# Patient Record
Sex: Male | Born: 1950
Health system: Southern US, Community
[De-identification: ages and names within clinical notes are randomized; demographics above are authoritative.]

## PROBLEM LIST (undated history)

## (undated) DIAGNOSIS — D126 Benign neoplasm of colon, unspecified: Secondary | ICD-10-CM

## (undated) DIAGNOSIS — M1990 Unspecified osteoarthritis, unspecified site: Secondary | ICD-10-CM

## (undated) DIAGNOSIS — E079 Disorder of thyroid, unspecified: Secondary | ICD-10-CM

## (undated) DIAGNOSIS — E785 Hyperlipidemia, unspecified: Secondary | ICD-10-CM

## (undated) DIAGNOSIS — I4891 Unspecified atrial fibrillation: Secondary | ICD-10-CM

## (undated) DIAGNOSIS — K5790 Diverticulosis of intestine, part unspecified, without perforation or abscess without bleeding: Secondary | ICD-10-CM

## (undated) HISTORY — DX: Disorder of thyroid, unspecified: E07.9

## (undated) HISTORY — DX: Unspecified osteoarthritis, unspecified site: M19.90

## (undated) HISTORY — PX: OTHER SURGICAL HISTORY: SHX169

## (undated) HISTORY — DX: Diverticulosis of intestine, part unspecified, without perforation or abscess without bleeding: K57.90

## (undated) HISTORY — DX: Hyperlipidemia, unspecified: E78.5

## (undated) HISTORY — DX: Benign neoplasm of colon, unspecified: D12.6

## (undated) HISTORY — PX: COLONOSCOPY: SHX174

## (undated) HISTORY — PX: TONSILLECTOMY: SUR1361

---

## 1980-06-17 HISTORY — PX: CHEST TUBE INSERTION: SHX231

## 2005-05-08 ENCOUNTER — Ambulatory Visit: Payer: Self-pay | Admitting: Internal Medicine

## 2005-06-17 DIAGNOSIS — K5790 Diverticulosis of intestine, part unspecified, without perforation or abscess without bleeding: Secondary | ICD-10-CM

## 2005-06-17 HISTORY — DX: Diverticulosis of intestine, part unspecified, without perforation or abscess without bleeding: K57.90

## 2005-06-17 HISTORY — PX: COLONOSCOPY W/ POLYPECTOMY: SHX1380

## 2005-06-21 ENCOUNTER — Ambulatory Visit: Payer: Self-pay | Admitting: Internal Medicine

## 2005-07-09 ENCOUNTER — Encounter (INDEPENDENT_AMBULATORY_CARE_PROVIDER_SITE_OTHER): Payer: Self-pay | Admitting: *Deleted

## 2005-07-09 ENCOUNTER — Ambulatory Visit: Payer: Self-pay | Admitting: Internal Medicine

## 2005-07-22 ENCOUNTER — Ambulatory Visit: Payer: Self-pay | Admitting: Internal Medicine

## 2005-08-05 ENCOUNTER — Ambulatory Visit: Payer: Self-pay | Admitting: Internal Medicine

## 2005-10-30 ENCOUNTER — Ambulatory Visit: Payer: Self-pay | Admitting: Internal Medicine

## 2006-02-11 ENCOUNTER — Ambulatory Visit: Payer: Self-pay | Admitting: Internal Medicine

## 2006-07-24 ENCOUNTER — Ambulatory Visit: Payer: Self-pay | Admitting: Internal Medicine

## 2006-07-24 LAB — CONVERTED CEMR LAB
HDL: 36 mg/dL — ABNORMAL LOW (ref >39.0)
Triglycerides: 68 mg/dL (ref 0–149)

## 2006-12-06 ENCOUNTER — Emergency Department (HOSPITAL_COMMUNITY): Admission: EM | Admit: 2006-12-06 | Discharge: 2006-12-06 | Payer: Self-pay | Admitting: Emergency Medicine

## 2007-01-21 ENCOUNTER — Ambulatory Visit: Payer: Self-pay | Admitting: Internal Medicine

## 2007-01-29 ENCOUNTER — Encounter (INDEPENDENT_AMBULATORY_CARE_PROVIDER_SITE_OTHER): Payer: Self-pay | Admitting: *Deleted

## 2007-01-29 LAB — CONVERTED CEMR LAB
HDL: 35.6 mg/dL — ABNORMAL LOW (ref >39.0)
LDL Cholesterol: 124 mg/dL — ABNORMAL HIGH (ref 0–99)
Triglycerides: 57 mg/dL (ref 0–149)
VLDL: 11 mg/dL (ref 0–40)

## 2007-02-19 ENCOUNTER — Ambulatory Visit: Payer: Self-pay | Admitting: Internal Medicine

## 2007-02-19 DIAGNOSIS — E039 Hypothyroidism, unspecified: Secondary | ICD-10-CM | POA: Insufficient documentation

## 2007-08-31 ENCOUNTER — Ambulatory Visit: Payer: Self-pay | Admitting: Internal Medicine

## 2007-09-14 LAB — CONVERTED CEMR LAB
TSH: 3.64 microintl units/mL (ref 0.35–5.50)
Total CHOL/HDL Ratio: 4.6
Triglycerides: 60 mg/dL (ref 0–149)
VLDL: 12 mg/dL (ref 0–40)

## 2007-09-15 ENCOUNTER — Encounter (INDEPENDENT_AMBULATORY_CARE_PROVIDER_SITE_OTHER): Payer: Self-pay | Admitting: *Deleted

## 2007-09-28 ENCOUNTER — Telehealth (INDEPENDENT_AMBULATORY_CARE_PROVIDER_SITE_OTHER): Payer: Self-pay | Admitting: *Deleted

## 2008-04-05 ENCOUNTER — Telehealth (INDEPENDENT_AMBULATORY_CARE_PROVIDER_SITE_OTHER): Payer: Self-pay | Admitting: *Deleted

## 2008-04-14 ENCOUNTER — Ambulatory Visit: Payer: Self-pay | Admitting: Internal Medicine

## 2008-04-18 LAB — CONVERTED CEMR LAB: TSH: 4.05 microintl units/mL (ref 0.35–5.50)

## 2008-04-26 ENCOUNTER — Ambulatory Visit: Payer: Self-pay | Admitting: Internal Medicine

## 2008-04-26 DIAGNOSIS — E785 Hyperlipidemia, unspecified: Secondary | ICD-10-CM | POA: Insufficient documentation

## 2008-04-26 LAB — CONVERTED CEMR LAB
Cholesterol, target level: 200 mg/dL
HDL goal, serum: 40 mg/dL

## 2008-09-21 ENCOUNTER — Ambulatory Visit: Payer: Self-pay | Admitting: Internal Medicine

## 2008-09-26 ENCOUNTER — Encounter (INDEPENDENT_AMBULATORY_CARE_PROVIDER_SITE_OTHER): Payer: Self-pay | Admitting: *Deleted

## 2008-09-26 LAB — CONVERTED CEMR LAB
Cholesterol: 175 mg/dL (ref 0–200)
LDL Cholesterol: 126 mg/dL — ABNORMAL HIGH (ref 0–99)

## 2008-09-28 ENCOUNTER — Ambulatory Visit: Payer: Self-pay | Admitting: Internal Medicine

## 2008-09-28 LAB — CONVERTED CEMR LAB
Cholesterol, target level: 200 mg/dL
LDL Goal: 90 mg/dL

## 2009-09-05 ENCOUNTER — Ambulatory Visit: Payer: Self-pay | Admitting: Internal Medicine

## 2009-09-11 LAB — CONVERTED CEMR LAB
AST: 30 units/L (ref 0–37)
Albumin: 3.9 g/dL (ref 3.5–5.2)
Alkaline Phosphatase: 59 units/L (ref 39–117)
Bilirubin, Direct: 0.1 mg/dL (ref 0.0–0.3)
Cholesterol: 171 mg/dL (ref 0–200)
Total CHOL/HDL Ratio: 4
Triglycerides: 50 mg/dL (ref 0.0–149.0)

## 2009-09-21 ENCOUNTER — Ambulatory Visit: Payer: Self-pay | Admitting: Internal Medicine

## 2010-06-17 DIAGNOSIS — I4891 Unspecified atrial fibrillation: Secondary | ICD-10-CM

## 2010-06-17 HISTORY — DX: Unspecified atrial fibrillation: I48.91

## 2010-06-26 ENCOUNTER — Encounter: Payer: Self-pay | Admitting: Internal Medicine

## 2010-07-17 NOTE — Assessment & Plan Note (Signed)
Summary: roa per note about refills//lch   Vital Signs:  Patient profile:   60 year old male Weight:      252.4 pounds Pulse rate:   60 / minute Resp:     14 per minute BP sitting:   116 / 80  (left arm) Cuff size:   large  Vitals Entered By: Shonna Chock (September 21, 2009 4:38 PM) CC: 1.) Follow-up visit (discuss labs)  2.) Refill Synthroid, Lipid Management Comments REVIEWED MED LIST, PATIENT AGREED DOSE AND INSTRUCTION CORRECT    CC:  1.) Follow-up visit (discuss labs)  2.) Refill Synthroid and Lipid Management.  History of Present Illness: Labs reviewed & risks discussed. No diet; CVE 5X /week , total  of 3 hrs w/o symptoms. No premature CAD in FH.Review of  serial lids show downward trend in LDL; not on ASA.  Lipid Management History:      Positive NCEP/ATP III risk factors include male age 1 years old or older.  Negative NCEP/ATP III risk factors include non-diabetic, no family history for ischemic heart disease, non-tobacco-user status, non-hypertensive, no ASHD (atherosclerotic heart disease), no prior stroke/TIA, no peripheral vascular disease, and no history of aortic aneurysm.     Allergies (verified): No Known Drug Allergies  Past History:  Past Medical History: Hyperlipidemia: NMR : LDL 100 (1422/814), TG 108, HDL 39. LDL goal = < 90, ideally < 70. Hypothyroidism  Review of Systems General:  Denies fatigue and weight loss. ENT:  Denies difficulty swallowing and hoarseness. CV:  Denies chest pain or discomfort, leg cramps with exertion, palpitations, and shortness of breath with exertion. GI:  Denies constipation and diarrhea. Derm:  Denies changes in nail beds, dryness, and hair loss. Neuro:  Denies numbness and tingling. Endo:  Denies cold intolerance and heat intolerance.  Physical Exam  General:  well-nourished; alert,appropriate and cooperative throughout examination Neck:  No deformities, masses, or tenderness noted. Heart:  Normal rate and regular  rhythm. S1 and S2 normal without gallop, murmur, click, rub .S4 Pulses:  R and L carotid,radial,dorsalis pedis and posterior tibial pulses are full and equal bilaterally   Impression & Recommendations:  Problem # 1:  HYPOTHYROIDISM (ICD-244.9) Corrected His updated medication list for this problem includes:    Synthroid 112 Mcg Tabs (Levothyroxine sodium) .Marland Kitchen... 1 once daily except 1&1/2 on sunday  Problem # 2:  HYPERLIPIDEMIA (ICD-272.4) Mildly elevated LDL , but trend is donward The following medications were removed from the medication list:    Pravastatin Sodium 20 Mg Tabs (Pravastatin sodium) .Marland Kitchen... 1 at bedtime  Complete Medication List: 1)  Synthroid 112 Mcg Tabs (Levothyroxine sodium) .Marland Kitchen.. 1 once daily except 1&1/2 on sunday 2)  Fish Oil   Lipid Assessment/Plan:      Based on NCEP/ATP III, the patient's risk factor category is "0-1 risk factors".  The patient's lipid goals are as follows: Total cholesterol goal is 200; LDL cholesterol goal is 90; HDL cholesterol goal is 40; Triglyceride goal is 150.  His LDL cholesterol goal has not been met.  Secondary causes for hyperlipidemia have been ruled out.  He has been counseled on adjunctive measures for lowering his cholesterol and has been provided with dietary instructions.    Patient Instructions: 1)  Please schedule a follow-up appointment in 1 year: TSH &  NMR Lipoprofile @ that time. 2)  Take an Aspirin every day. Prescriptions: SYNTHROID 112 MCG TABS (LEVOTHYROXINE SODIUM) 1 once daily EXCEPT 1&1/2 on Sunday  #90 x 31   Entered  and Authorized by:   Marga Melnick MD   Signed by:   Marga Melnick MD on 09/21/2009   Method used:   Faxed to ...       [facility] (retail)       [address]       [place], [place]  [address]       Ph: [phone] or [phone]       Fax: [phone]   RxID:   [id number]

## 2010-07-19 NOTE — Letter (Signed)
Summary: Colonoscopy Letter  [facility]  [address] [place], [place] [address]   Phone: [phone]  Fax: [phone]      June 26, 2010 MRN: [id number]   Elijah [name] [address] [place], [place]  [address]   Dear [name],   According to your medical record, it is time for you to schedule a Colonoscopy. The American Cancer Society recommends this procedure as a method to detect early colon cancer. Patients with a family history of colon cancer, or a personal history of colon polyps or inflammatory bowel disease are at increased risk.  This letter has been generated based on the recommendations made at the time of your procedure. If you feel that in your particular situation this may no longer apply, please contact our office.  Please call our office at [phone] to schedule this appointment or to update your records at your earliest convenience.  Thank you for cooperating with [place] to provide you with the very best care possible.   Sincerely,  Hedwig Morton. Juanda Chance, M.D.  [facility] [phone]

## 2010-10-16 ENCOUNTER — Encounter: Payer: Self-pay | Admitting: Internal Medicine

## 2010-10-19 ENCOUNTER — Other Ambulatory Visit: Payer: Self-pay | Admitting: Internal Medicine

## 2010-12-16 HISTORY — PX: TRANSTHORACIC ECHOCARDIOGRAM: SHX275

## 2010-12-17 ENCOUNTER — Encounter: Payer: Self-pay | Admitting: Internal Medicine

## 2010-12-20 ENCOUNTER — Encounter: Payer: Self-pay | Admitting: Internal Medicine

## 2010-12-20 ENCOUNTER — Ambulatory Visit (INDEPENDENT_AMBULATORY_CARE_PROVIDER_SITE_OTHER): Payer: BC Managed Care – PPO | Admitting: Internal Medicine

## 2010-12-20 VITALS — BP 122/88 | HR 72 | Temp 97.5°F | Resp 12 | Ht 73.75 in | Wt 256.6 lb

## 2010-12-20 DIAGNOSIS — E785 Hyperlipidemia, unspecified: Secondary | ICD-10-CM

## 2010-12-20 DIAGNOSIS — E039 Hypothyroidism, unspecified: Secondary | ICD-10-CM

## 2010-12-20 DIAGNOSIS — Z0000 Encounter for general adult medical examination without abnormal findings: Secondary | ICD-10-CM

## 2010-12-20 DIAGNOSIS — I4891 Unspecified atrial fibrillation: Secondary | ICD-10-CM

## 2010-12-20 MED ORDER — LEVOTHYROXINE SODIUM 112 MCG PO TABS: 112.0000 ug | ORAL_TABLET | ORAL | Status: DC

## 2010-12-20 NOTE — Patient Instructions (Addendum)
Preventive Health Care: Exercise at least 30-45 minutes a day,  3-4 days a week.  Eat a low-fat diet with lots of fruits and vegetables, up to 7-9 servings per day.Consume less than 40 grams of sugar per day from foods & drinks with High Fructose Corn Sugar as #2,3 or # 4 on label. Please  schedule fasting Labs : BMET,Lipids, hepatic panel, CBC & dif, TSH,Magnesium level, PSA.  (V70.0, 427.31, 272.4, 600.9,244.9)  Increase aspirin to 3 of 81 mg pills daily

## 2010-12-20 NOTE — Progress Notes (Signed)
Subjective:    Patient ID: Elijah [name], male    DOB: June 02, 1951, 60 y.o.   MRN: [id number]  HPI  Mr Tallerico  is here for a physical; he has no acute issues .     Review of Systems Patient reports no  vision/ hearing changes,anorexia, weight change, fever ,adenopathy, persistant / recurrent hoarseness, swallowing issues, chest pain, edema,persistant / recurrent cough, hemoptysis, dyspnea(rest, exertional, paroxysmal nocturnal), gastrointestinal  bleeding (melena, rectal bleeding), abdominal pain, excessive heart burn, GU symptoms( dysuria, hematuria, pyuria, voiding/incontinence  issues) syncope, focal weakness, memory loss,numbness & tingling, skin/hair/nail changes,depression, anxiety, abnormal bruising/bleeding, or  musculoskeletal symptoms/signs.   With exercise he has noted some racing and slight irregularity.    Objective:   Physical Exam Gen.: Healthy and well-nourished in appearance. Alert, appropriate and cooperative throughout exam. Head: Normocephalic without obvious abnormalities. Moustache.  Eyes: No corneal or conjunctival inflammation noted. Pupils equal round reactive to light and accommodation. Fundal exam is benign without hemorrhages, exudate, papilledema. Extraocular motion intact. Vision grossly normal. Ears: External  ear exam reveals no significant lesions or deformities. Canals clear .TMs normal. Hearing is grossly normal bilaterally. Nose: External nasal exam reveals no deformity or inflammation. Nasal mucosa are pink and moist. No lesions or exudates noted. Septum  : not deviated  Mouth: Oral mucosa and oropharynx reveal no lesions or exudates. Teeth in good repair. Neck: No deformities, masses, or tenderness noted. Range of motion  normal. Thyroid : physiologic asymmetry. Lungs: Normal respiratory effort; chest expands symmetrically. Lungs are clear to auscultation without rales, wheezes, or increased work of breathing. Heart: Normal rate ; slightly irregular   rhythm. Normal S1 and S2. No gallop, click, or rub. No  murmur. Abdomen: Bowel sounds normal; abdomen soft and nontender. No masses, organomegaly or hernias noted. Genitalia/DRE: prostate broad & flat w/o nodules. Minor granuloma L supratesticular area   .                                                                                   Musculoskeletal/extremities: No deformity or scoliosis noted of  the thoracic or lumbar spine. No clubbing, cyanosis, edema, or deformity noted. Range of motion  normal .Tone & strength  normal.Joints normal. Nail health  good. Vascular: Carotid, radial artery, dorsalis pedis and  posterior tibial pulses are full and equal. No bruits present. Neurologic: Alert and oriented x3. Deep tendon reflexes symmetrical and normal.          Skin: Intact without suspicious lesions or rashes. Lymph: No cervical, axillary, or inguinal lymphadenopathy present. Psych: Mood and affect are normal. Normally interactive                                                                                          Assessment & Plan:  #1 comprehensive physical exam; no acute findings #  2 see Problem List with Assessments & Recommendations Plan: see Lab  Orders   EKG reveals new onset atrial fibrillation which is asymptomatic. Referral will be made to a cardiologist. Once cardiac status has been addressed, repeat colonoscopy may be necessary as he had adenomatous polyp in 2007.

## 2010-12-21 ENCOUNTER — Other Ambulatory Visit (INDEPENDENT_AMBULATORY_CARE_PROVIDER_SITE_OTHER): Payer: BC Managed Care – PPO

## 2010-12-21 DIAGNOSIS — Z0000 Encounter for general adult medical examination without abnormal findings: Secondary | ICD-10-CM

## 2010-12-21 DIAGNOSIS — I4891 Unspecified atrial fibrillation: Secondary | ICD-10-CM

## 2010-12-21 DIAGNOSIS — E785 Hyperlipidemia, unspecified: Secondary | ICD-10-CM

## 2010-12-21 DIAGNOSIS — E039 Hypothyroidism, unspecified: Secondary | ICD-10-CM

## 2010-12-21 LAB — CBC WITH DIFFERENTIAL/PLATELET
Basophils Relative: 0.5 % (ref 0.0–3.0)
Eosinophils Absolute: 0.2 10*3/uL (ref 0.0–0.7)
Eosinophils Relative: 3 % (ref 0.0–5.0)
HCT: 45 % (ref 39.0–52.0)
Hemoglobin: 15.4 g/dL (ref 13.0–17.0)
MCHC: 34.2 g/dL (ref 30.0–36.0)
MCV: 90 fl (ref 78.0–100.0)
Monocytes Absolute: 0.8 10*3/uL (ref 0.1–1.0)
Neutro Abs: 3.1 10*3/uL (ref 1.4–7.7)
RBC: 5 Mil/uL (ref 4.22–5.81)
WBC: 6.9 10*3/uL (ref 4.5–10.5)

## 2010-12-21 LAB — HEPATIC FUNCTION PANEL
Bilirubin, Direct: 0.1 mg/dL (ref 0.0–0.3)
Total Bilirubin: 0.7 mg/dL (ref 0.3–1.2)

## 2010-12-21 LAB — BASIC METABOLIC PANEL
BUN: 23 mg/dL (ref 6–23)
Calcium: 8.7 mg/dL (ref 8.4–10.5)
Chloride: 108 mEq/L (ref 96–112)
Creatinine, Ser: 1.1 mg/dL (ref 0.4–1.5)
GFR: 69.75 mL/min (ref >60.00)

## 2010-12-21 NOTE — Progress Notes (Signed)
Labs only

## 2010-12-25 ENCOUNTER — Telehealth: Payer: Self-pay | Admitting: Internal Medicine

## 2010-12-25 NOTE — Telephone Encounter (Signed)
Spoke with Dr Ladona Ridgel  He did agree and will see the patient on 12/31/10 at 4:30  Patient aware

## 2010-12-25 NOTE — Telephone Encounter (Signed)
Pt wife calling, pt has appt with Dr. Graciela Husbands. Pt and pt wife saw Dr. Ladona Ridgel at swim meet and Dr. Ladona Ridgel told pt and pt wife that he would take on new pt. However without notification pt was scheduled as a new pt w/ Dr. Graciela Husbands. Pt wife calling to see if she should cancel appt w/ Dr. Graciela Husbands and get clearance from Dr. Ladona Ridgel that pt can be scheduled for an appt w/ him.

## 2010-12-27 ENCOUNTER — Encounter: Payer: Self-pay | Admitting: Internal Medicine

## 2010-12-27 ENCOUNTER — Institutional Professional Consult (permissible substitution): Payer: BC Managed Care – PPO | Admitting: Internal Medicine

## 2010-12-31 ENCOUNTER — Encounter: Payer: Self-pay | Admitting: Internal Medicine

## 2010-12-31 ENCOUNTER — Ambulatory Visit (INDEPENDENT_AMBULATORY_CARE_PROVIDER_SITE_OTHER): Payer: BC Managed Care – PPO | Admitting: Internal Medicine

## 2010-12-31 VITALS — BP 118/80 | HR 76 | Resp 18 | Ht 74.0 in | Wt 258.0 lb

## 2010-12-31 DIAGNOSIS — E785 Hyperlipidemia, unspecified: Secondary | ICD-10-CM

## 2010-12-31 DIAGNOSIS — I482 Chronic atrial fibrillation, unspecified: Secondary | ICD-10-CM | POA: Insufficient documentation

## 2010-12-31 DIAGNOSIS — I4891 Unspecified atrial fibrillation: Secondary | ICD-10-CM

## 2010-12-31 NOTE — Assessment & Plan Note (Signed)
His fasting lipid profile has been reviewed. He will continue his current medical therapy at this time.

## 2010-12-31 NOTE — Assessment & Plan Note (Signed)
This appears to be a new problem. He is completely asymptomatic. Today we discussed the treatment options. Because of his lack of symptoms, I have recommended rate control. His stroke risk is very small. I would recommend continued aspirin. Whether he takes 81 mg or 325 mg a day or some in between dose I think is not particularly important at this point in time. Finally, the patient's snoring history may provide some clues as to why he has atrial fibrillation. For now however we'll plan to obtain a 48-hour Holter to make sure that his ventricular rate is well controlled in atrial fibrillation and had him undergo echocardiography. I discussed all these issues with the patient and his wife. No additional questions at this time.

## 2010-12-31 NOTE — Progress Notes (Signed)
HPI [name] is referred today by Dr. Alwyn Ren for evaluation of atrial fibrillation. The patient has a history of thyroid dysfunction and has been on thyroid replacement. He denies hypertension, diabetes, or history of stroke or congestive heart failure. He was found to be in atrial fibrillation at routine followup. He denies chest pain, shortness of breath, syncope, or palpitations. The patient does have a history of snoring. He has not undergone sleep evaluation at this point. No Known Allergies   Current Outpatient Prescriptions  Medication Sig Dispense Refill  . aspirin 81 MG EC tablet Take 81 mg by mouth 3 (three) times daily.       Marland Kitchen levothyroxine (SYNTHROID) 112 MCG tablet Take 1 tablet (112 mcg total) by mouth as directed. 1 daily EXCEPT 1&1/2 on Sun  90 tablet  3  . Omega-3 Fatty Acids (FISH OIL PO) Take by mouth daily.           Past Medical History  Diagnosis Date  . Hyperlipidemia   . Thyroid disease     hypothyroidism  . Diverticulosis 2007    ROS:   All systems reviewed and negative except as noted in the HPI.   Past Surgical History  Procedure Date  . Tonsillectomy   . Chest tube insertion 1982    Spontaneous Pneumothorax  . Colonoscopy w/ polypectomy 2007    Adenomatous Polyp     Family History  Problem Relation Age of Onset  . Stroke Mother   . Breast cancer Mother   . Valvular heart disease Mother     valve replacement  . Hypertension Mother   . Cerebral aneurysm Sister 24  . Hypertension Sister      History   Social History  . Marital Status: Married    Spouse Name: N/A    Number of Children: N/A  . Years of Education: N/A   Occupational History  . Not on file.   Social History Main Topics  . Smoking status: Never Smoker   . Smokeless tobacco: Not on file  . Alcohol Use: No  . Drug Use: No  . Sexually Active: Not on file   Other Topics Concern  . Not on file   Social History Narrative  . No narrative on file     BP 118/80   Pulse 76  Resp 18  Ht 6\' 2"  (1.88 m)  Wt 258 lb (117.028 kg)  BMI 33.13 kg/m2  Physical Exam:  Well appearing NAD HEENT: Unremarkable Neck:  No JVD, no thyromegally Lymphatics:  No adenopathy Back:  No CVA tenderness Lungs:  Clear HEART:  Iregular rate rhythm, no murmurs, no rubs, no clicks Abd:  soft, positive bowel sounds, no organomegally, no rebound, no guarding Ext:  2 plus pulses, no edema, no cyanosis, no clubbing Skin:  No rashes no nodules Neuro:  CN II through XII intact, motor grossly intact  EKG Atrial fibrillation with a controlled ventricular response  Assess/Plan:

## 2010-12-31 NOTE — Patient Instructions (Signed)
Your physician has requested that you have an echocardiogram. Echocardiography is a painless test that uses sound waves to create images of your heart. It provides your doctor with information about the size and shape of your heart and how well your heart's chambers and valves are working. This procedure takes approximately one hour. There are no restrictions for this procedure.  Your physician has recommended that you wear a holter monitor. Holter monitors are medical devices that record the heart's electrical activity. Doctors most often use these monitors to diagnose arrhythmias. Arrhythmias are problems with the speed or rhythm of the heartbeat. The monitor is a small, portable device. You can wear one while you do your normal daily activities. This is usually used to diagnose what is causing palpitations/syncope (passing out).   

## 2011-01-02 ENCOUNTER — Encounter: Payer: Self-pay | Admitting: *Deleted

## 2011-01-11 ENCOUNTER — Ambulatory Visit (HOSPITAL_COMMUNITY): Payer: BC Managed Care – PPO | Attending: Internal Medicine | Admitting: Radiology

## 2011-01-11 ENCOUNTER — Encounter (INDEPENDENT_AMBULATORY_CARE_PROVIDER_SITE_OTHER): Payer: BC Managed Care – PPO

## 2011-01-11 DIAGNOSIS — E669 Obesity, unspecified: Secondary | ICD-10-CM | POA: Insufficient documentation

## 2011-01-11 DIAGNOSIS — I079 Rheumatic tricuspid valve disease, unspecified: Secondary | ICD-10-CM | POA: Insufficient documentation

## 2011-01-11 DIAGNOSIS — I4891 Unspecified atrial fibrillation: Secondary | ICD-10-CM

## 2011-01-11 DIAGNOSIS — E785 Hyperlipidemia, unspecified: Secondary | ICD-10-CM | POA: Insufficient documentation

## 2011-01-11 DIAGNOSIS — I059 Rheumatic mitral valve disease, unspecified: Secondary | ICD-10-CM | POA: Insufficient documentation

## 2011-02-05 ENCOUNTER — Encounter: Payer: Self-pay | Admitting: *Deleted

## 2011-04-17 ENCOUNTER — Telehealth: Payer: Self-pay | Admitting: Internal Medicine

## 2011-04-17 NOTE — Telephone Encounter (Signed)
New message  He had questions about echocardiogram  Please call

## 2011-04-17 NOTE — Telephone Encounter (Signed)
Patient can not come on 05/02/2012 for follow up and will call back to schedule follow up in 05/2011  Dr Ladona Ridgel aware

## 2011-05-03 ENCOUNTER — Ambulatory Visit: Payer: BC Managed Care – PPO | Admitting: Internal Medicine

## 2011-05-28 ENCOUNTER — Ambulatory Visit (INDEPENDENT_AMBULATORY_CARE_PROVIDER_SITE_OTHER): Payer: BC Managed Care – PPO | Admitting: Internal Medicine

## 2011-05-28 ENCOUNTER — Encounter: Payer: Self-pay | Admitting: Internal Medicine

## 2011-05-28 DIAGNOSIS — I4891 Unspecified atrial fibrillation: Secondary | ICD-10-CM

## 2011-05-28 DIAGNOSIS — E669 Obesity, unspecified: Secondary | ICD-10-CM

## 2011-05-28 NOTE — Assessment & Plan Note (Signed)
I discussed the importance of weight loss with the patient. He states that he will try to do better.

## 2011-05-28 NOTE — Assessment & Plan Note (Addendum)
His ventricular rate appears to be well controlled and he is asymptomatic. His chest core is 0-1. I recommended that he continue his current medical therapy. I would not add any AV nodal blocking drugs this time. A period of watchful waiting is recommended.

## 2011-05-28 NOTE — Progress Notes (Signed)
Addended by: Erin Hearing on: 05/28/2011 02:20 PM   Modules accepted: Orders

## 2011-05-28 NOTE — Progress Notes (Signed)
HPI Elijah [name] returns today for followup. He is a pleasant 60 year old man with a history of minimally symptomatic atrial fibrillation, thyroid dysfunction on therapy, and dyslipidemia. The patient has done well. Recently, we asked the patient to wear a cardiac monitor which demonstrated well controlled atrial fibrillation. He does not have palpitations and denies syncope, near syncope, chest pain, or shortness of breath. No Known Allergies   Current Outpatient Prescriptions  Medication Sig Dispense Refill  . aspirin 81 MG EC tablet Take 162 mg by mouth daily.       Marland Kitchen levothyroxine (SYNTHROID) 112 MCG tablet Take 1 tablet (112 mcg total) by mouth as directed. 1 daily EXCEPT 1&1/2 on Sun  90 tablet  3  . Omega-3 Fatty Acids (FISH OIL PO) Take 2 capsules by mouth daily.          Past Medical History  Diagnosis Date  . Hyperlipidemia   . Thyroid disease     hypothyroidism  . Diverticulosis 2007    ROS:   All systems reviewed and negative except as noted in the HPI.   Past Surgical History  Procedure Date  . Tonsillectomy   . Chest tube insertion 1982    Spontaneous Pneumothorax  . Colonoscopy w/ polypectomy 2007    Adenomatous Polyp  . Transthoracic echocardiogram  12/2010,   . Holter monitor-48 hour 03/2011, 12/2010     Family History  Problem Relation Age of Onset  . Stroke Mother   . Breast cancer Mother   . Valvular heart disease Mother     valve replacement  . Hypertension Mother   . Cerebral aneurysm Sister 33  . Hypertension Sister      History   Social History  . Marital Status: Married    Spouse Name: N/A    Number of Children: N/A  . Years of Education: N/A   Occupational History  . Not on file.   Social History Main Topics  . Smoking status: Never Smoker   . Smokeless tobacco: Not on file  . Alcohol Use: No  . Drug Use: No  . Sexually Active: Not on file   Other Topics Concern  . Not on file   Social History Narrative  . No narrative on  file     BP 128/86  Pulse 74  Ht 6\' 3"  (1.905 m)  Wt 118.443 kg (261 lb 1.9 oz)  BMI 32.64 kg/m2  Physical Exam:  Well appearing middle-age man,NAD HEENT: Unremarkable Neck:  No JVD, no thyromegally Lymphatics:  No adenopathy Back:  No CVA tenderness Lungs:  Clear with No wheezes, rales, or rhonchi.  Cardiovascular: IRegular rate rhythm, no murmurs, no rubs, no clicks Abd:  soft, positive bowel sounds, no organomegally, no rebound, no guarding Ext:  2 plus pulses, no edema, no cyanosis, no clubbing Skin:  No rashes no nodules Neuro:  CN II through XII intact, motor grossly intact  EKG Atrial fibrillation with a controlled ventricular response.  Assess/Plan:

## 2011-05-28 NOTE — Patient Instructions (Signed)
Your physician wants you to follow-up in: 12 months with Dr. Taylor. You will receive a reminder letter in the mail two months in advance. If you don't receive a letter, please call our office to schedule the follow-up appointment.    

## 2011-06-07 NOTE — Progress Notes (Signed)
Addended by: Dennis Bast F on: 06/07/2011 08:35 AM   Modules accepted: Orders

## 2011-11-10 ENCOUNTER — Ambulatory Visit (INDEPENDENT_AMBULATORY_CARE_PROVIDER_SITE_OTHER): Payer: BC Managed Care – PPO | Admitting: Family Medicine

## 2011-11-10 VITALS — BP 102/76 | HR 77 | Temp 97.6°F | Resp 16 | Ht 73.4 in | Wt 245.0 lb

## 2011-11-10 DIAGNOSIS — K529 Noninfective gastroenteritis and colitis, unspecified: Secondary | ICD-10-CM

## 2011-11-10 DIAGNOSIS — R197 Diarrhea, unspecified: Secondary | ICD-10-CM

## 2011-11-10 DIAGNOSIS — K5289 Other specified noninfective gastroenteritis and colitis: Secondary | ICD-10-CM

## 2011-11-10 DIAGNOSIS — R110 Nausea: Secondary | ICD-10-CM

## 2011-11-10 LAB — POCT UA - MICROSCOPIC ONLY
Bacteria, U Microscopic: NEGATIVE
Crystals, Ur, HPF, POC: NEGATIVE
Mucus, UA: POSITIVE

## 2011-11-10 LAB — POCT CBC
HCT, POC: 52.1 % (ref 43.5–53.7)
Hemoglobin: 18 g/dL (ref 14.1–18.1)
Lymph, poc: 1.8 (ref 0.6–3.4)
MCH, POC: 30.7 pg (ref 27–31.2)
MCHC: 34.5 g/dL (ref 31.8–35.4)
MCV: 88.8 fL (ref 80–97)
MPV: 10.3 fL (ref 0–99.8)
RBC: 5.87 M/uL (ref 4.69–6.13)
WBC: 5 10*3/uL (ref 4.6–10.2)

## 2011-11-10 LAB — POCT URINALYSIS DIPSTICK
Blood, UA: NEGATIVE
Glucose, UA: NEGATIVE
Ketones, UA: 15
Nitrite, UA: NEGATIVE
pH, UA: 5.5

## 2011-11-10 MED ORDER — ONDANSETRON 4 MG PO TBDP: ORAL_TABLET | ORAL | Status: DC

## 2011-11-10 NOTE — Patient Instructions (Addendum)
Viral Gastroenteritis Viral gastroenteritis is also known as stomach flu. This condition affects the stomach and intestinal tract. It can cause sudden diarrhea and vomiting. The illness typically lasts 3 to 8 days. Most people develop an immune response that eventually gets rid of the virus. While this natural response develops, the virus can make you quite ill. CAUSES  Many different viruses can cause gastroenteritis, such as rotavirus or noroviruses. You can catch one of these viruses by consuming contaminated food or water. You may also catch a virus by sharing utensils or other personal items with an infected person or by touching a contaminated surface. SYMPTOMS  The most common symptoms are diarrhea and vomiting. These problems can cause a severe loss of body fluids (dehydration) and a body salt (electrolyte) imbalance. Other symptoms may include:  Fever.   Headache.   Fatigue.   Abdominal pain.  DIAGNOSIS  Your caregiver can usually diagnose viral gastroenteritis based on your symptoms and a physical exam. A stool sample may also be taken to test for the presence of viruses or other infections. TREATMENT  This illness typically goes away on its own. Treatments are aimed at rehydration. The most serious cases of viral gastroenteritis involve vomiting so severely that you are not able to keep fluids down. In these cases, fluids must be given through an intravenous line (IV). HOME CARE INSTRUCTIONS   Drink enough fluids to keep your urine clear or pale yellow. Drink small amounts of fluids frequently and increase the amounts as tolerated.   Ask your caregiver for specific rehydration instructions.   Avoid:   Foods high in sugar.   Alcohol.   Carbonated drinks.   Tobacco.   Juice.   Caffeine drinks.   Extremely hot or cold fluids.   Fatty, greasy foods.   Too much intake of anything at one time.   Dairy products until 24 to 48 hours after diarrhea stops.   You may  consume probiotics. Probiotics are active cultures of beneficial bacteria. They may lessen the amount and number of diarrheal stools in adults. Probiotics can be found in yogurt with active cultures and in supplements.   Wash your hands well to avoid spreading the virus.   Only take over-the-counter or prescription medicines for pain, discomfort, or fever as directed by your caregiver. Do not give aspirin to children. Antidiarrheal medicines are not recommended.   Ask your caregiver if you should continue to take your regular prescribed and over-the-counter medicines.   Keep all follow-up appointments as directed by your caregiver.  SEEK IMMEDIATE MEDICAL CARE IF:   You are unable to keep fluids down.   You do not urinate at least once every 6 to 8 hours.   You develop shortness of breath.   You notice blood in your stool or vomit. This may look like coffee grounds.   You have abdominal pain that increases or is concentrated in one small area (localized).   You have persistent vomiting or diarrhea.   You have a fever.   The patient is a child younger than 3 months, and he or she has a fever.   The patient is a child older than 3 months, and he or she has a fever and persistent symptoms.   The patient is a child older than 3 months, and he or she has a fever and symptoms suddenly get worse.   The patient is a baby, and he or she has no tears when crying.  MAKE SURE YOU:     Understand these instructions.   Will watch your condition.   Will get help right away if you are not doing well or get worse.  Document Released: 06/03/2005 Document Revised: 05/23/2011 Document Reviewed: 03/20/2011 [facility] Patient Information 2012 [place], [place].   Get OTC Imodium

## 2011-11-10 NOTE — Progress Notes (Signed)
Subjective: 61 year old white male with history of having an upset stomach since Friday. He was on a business trip [employer]) to [place]. 8 out a lot and late-night meals and meetings. He developed nausea on Friday. Has continued to just not feel good since then. Yesterday he developed some diarrhea and has had about 4 or 5 diarrheal bowel movements morning. Not much in the way of pain. No fever. He is not known to have a lot of gastroenteritis-type infections. His wife has had a respiratory tract infection but not had these kind of symptoms. He has not any respiratory symptoms. No GU symptoms. He's not taking any OTC medications except for omega-3. He is on thyroid medication. He is generally fairly healthy.  Objective: No acute distress. Overweight white male. Chest clear. Heart regular without murmurs. Abdomen soft without masses or tenderness. Normal bowel sounds. No CVA tenderness. He is alert and oriented.  Assessment: Gastroenteritis Plan: Check CBC UA  Results for orders placed in visit on 11/10/11  POCT CBC      Component Value Range   WBC 5.0  4.6 - 10.2 (K/uL)   Lymph, poc 1.8  0.6 - 3.4    POC LYMPH PERCENT 36.9  10 - 50 (%L)   MID (cbc) 0.6  0 - 0.9    POC MID % 12.9 (*) 0 - 12 (%M)   POC Granulocyte 2.5  2 - 6.9    Granulocyte percent 50.2  37 - 80 (%G)   RBC 5.87  4.69 - 6.13 (M/uL)   Hemoglobin 18.0  14.1 - 18.1 (g/dL)   HCT, POC 16.1  09.6 - 53.7 (%)   MCV 88.8  80 - 97 (fL)   MCH, POC 30.7  27 - 31.2 (pg)   MCHC 34.5  31.8 - 35.4 (g/dL)   RDW, POC 04.5     Platelet Count, POC 172  142 - 424 (K/uL)   MPV 10.3  0 - 99.8 (fL)  POCT URINALYSIS DIPSTICK      Component Value Range   Color, UA dark yellow     Clarity, UA cloudy     Glucose, UA negative     Bilirubin, UA small     Ketones, UA 15     Spec Grav, UA >=1.030     Blood, UA negative     pH, UA 5.5     Protein, UA trace     Urobilinogen, UA 0.2     Nitrite, UA negative     Leukocytes, UA Negative    POCT  UA - MICROSCOPIC ONLY      Component Value Range   WBC, Ur, HPF, POC 1-2     RBC, urine, microscopic 1-2     Bacteria, U Microscopic negative     Mucus, UA positive     Epithelial cells, urine per micros 0-1     Crystals, Ur, HPF, POC negative     Casts, Ur, LPF, POC negative     Yeast, UA negative     Reviewed above labs a urinalysis. This is consistent with her gastroneuritis causing the nausea and diarrhea. We'll treat symptomatically. Discussed with patient.

## 2012-01-08 ENCOUNTER — Other Ambulatory Visit: Payer: Self-pay | Admitting: Internal Medicine

## 2012-01-08 DIAGNOSIS — E039 Hypothyroidism, unspecified: Secondary | ICD-10-CM

## 2012-01-08 MED ORDER — LEVOTHYROXINE SODIUM 112 MCG PO TABS: 112.0000 ug | ORAL_TABLET | ORAL | Status: DC

## 2012-01-08 NOTE — Telephone Encounter (Signed)
Refill SYNTHROID 112 MCG Take 1 tablet  by mouth as directed. 1 daily EXCEPT 1&1/2 on Sun #90 wt/3-refills last fill 6.21.13 Last ov (CPX) 7.5.12 Current appt for CPE 7.29.13

## 2012-01-08 NOTE — Telephone Encounter (Signed)
RX sent

## 2012-01-13 ENCOUNTER — Ambulatory Visit (INDEPENDENT_AMBULATORY_CARE_PROVIDER_SITE_OTHER): Payer: BC Managed Care – PPO | Admitting: Internal Medicine

## 2012-01-13 ENCOUNTER — Encounter: Payer: Self-pay | Admitting: Internal Medicine

## 2012-01-13 VITALS — BP 118/72 | HR 78 | Temp 97.7°F | Resp 12 | Ht 73.03 in | Wt 244.2 lb

## 2012-01-13 DIAGNOSIS — E785 Hyperlipidemia, unspecified: Secondary | ICD-10-CM

## 2012-01-13 DIAGNOSIS — E039 Hypothyroidism, unspecified: Secondary | ICD-10-CM

## 2012-01-13 DIAGNOSIS — I4891 Unspecified atrial fibrillation: Secondary | ICD-10-CM

## 2012-01-13 NOTE — Patient Instructions (Addendum)
Please  schedule fasting Labs : BMET,Lipids, Mg++, TSH.PLEASE BRING THESE INSTRUCTIONS TO FOLLOW UP  LAB APPOINTMENT.This will guarantee correct labs are drawn, eliminating need for repeat blood sampling ( needle sticks ! ). Diagnoses /Codes: atrial fib, hypothyroidism, dyslipidemia. To prevent tacycardia, avoid stimulants such as decongestants, diet pills, nicotine, or caffeine (coffee, tea, cola, or chocolate) to excess.  Please try to go on My Chart within the next 24 hours to allow me to release the results directly to you.

## 2012-01-13 NOTE — Assessment & Plan Note (Signed)
Therapeutic TSH and normal chemistries/electrolytes will be verified

## 2012-01-13 NOTE — Assessment & Plan Note (Signed)
Because of atrial fibrillation; avoidance of excessive TSH suppression will be goal

## 2012-01-13 NOTE — Progress Notes (Signed)
  Subjective:    Patient ID: Elijah [name], male    DOB: 10-Jun-1951, 61 y.o.   MRN: [id number]  HPI He remains on Synthroid without change in dose. He's had a 10 pound weight loss which was purposeful.  He was found to have atrial fibrillation incidentally on a physical exam last year. He's been evaluated in detail by Dr. Ladona Ridgel. Dr. Ladona Ridgel and recommended getting his BMI below 30 but she is pursuing. He also recommended restricting caffeine. He denies any chest pain or palpitations at this time; but again he was asymptomatic at the time of diagnosis.  Significantly he had an adenomatous polyp removed by Dr. Juanda Chance in 2007; he's had no follow up to date    Review of Systems   He denies fatigue, sleep dysfunction, alteration in appetite, hoarseness, or swallowing issues. He also has no blurred vision, double vision, or loss of vision. He has no constipation or diarrhea.  There's been no change in his hair, nails or skin. He denies paresthesia of extremities or tremor. There's been no mood change such as anxiety depression. He has no intolerance to cold.  He has not had abdominal pain, melena, rectal bleeding, or change in caliber or stool       Objective:   Physical Exam Gen.:  well-nourished; in no acute distress Eyes: Extraocular motion intact; no lid lag or proptosis Neck: Full range of motion; thyroid normal w/o nodules Heart: Slow irregular  rhythm and rate without significant murmur, gallop, or extra heart sounds Lungs: Chest clear to auscultation without rales,rales, wheezes Abdomen: No organomegaly or masses; no tenderness Vascular: All pulses intact without bruits. Extremities: Trace edema at the sock line. No clubbing or cyanosis. Neuro:Deep tendon reflexes are equal and within normal limits; no tremor  Skin: Warm and dry without significant lesions or rashes; no onycholysis Psych: Normally communicative and interactive; no abnormal mood or affect clinically.           Assessment & Plan:

## 2012-01-13 NOTE — Assessment & Plan Note (Signed)
Fasting lipids should be rechecked in the context of his 10 pound weight loss with therapeutic life interventions

## 2012-01-15 ENCOUNTER — Other Ambulatory Visit (INDEPENDENT_AMBULATORY_CARE_PROVIDER_SITE_OTHER): Payer: BC Managed Care – PPO

## 2012-01-15 DIAGNOSIS — E785 Hyperlipidemia, unspecified: Secondary | ICD-10-CM

## 2012-01-15 DIAGNOSIS — I4891 Unspecified atrial fibrillation: Secondary | ICD-10-CM

## 2012-01-15 DIAGNOSIS — E039 Hypothyroidism, unspecified: Secondary | ICD-10-CM

## 2012-01-15 LAB — BASIC METABOLIC PANEL
BUN: 18 mg/dL (ref 6–23)
CO2: 26 mEq/L (ref 19–32)
Calcium: 9.1 mg/dL (ref 8.4–10.5)
GFR: 64.88 mL/min (ref >60.00)
Glucose, Bld: 89 mg/dL (ref 70–99)

## 2012-01-15 LAB — MAGNESIUM: Magnesium: 1.9 mg/dL (ref 1.5–2.5)

## 2012-01-15 LAB — TSH: TSH: 3.15 u[IU]/mL (ref 0.35–5.50)

## 2012-01-15 LAB — LIPID PANEL
Total CHOL/HDL Ratio: 3
VLDL: 9.8 mg/dL (ref 0.0–40.0)

## 2012-02-16 ENCOUNTER — Other Ambulatory Visit: Payer: Self-pay | Admitting: Internal Medicine

## 2012-03-02 ENCOUNTER — Encounter: Payer: Self-pay | Admitting: Internal Medicine

## 2012-03-31 ENCOUNTER — Ambulatory Visit (INDEPENDENT_AMBULATORY_CARE_PROVIDER_SITE_OTHER): Payer: BC Managed Care – PPO

## 2012-03-31 DIAGNOSIS — Z2911 Encounter for prophylactic immunotherapy for respiratory syncytial virus (RSV): Secondary | ICD-10-CM

## 2012-03-31 DIAGNOSIS — Z23 Encounter for immunization: Secondary | ICD-10-CM

## 2012-03-31 NOTE — Patient Instructions (Signed)
Patient states he checked coverage with insurance company

## 2012-05-07 ENCOUNTER — Ambulatory Visit (INDEPENDENT_AMBULATORY_CARE_PROVIDER_SITE_OTHER): Payer: BC Managed Care – PPO

## 2012-05-07 DIAGNOSIS — Z23 Encounter for immunization: Secondary | ICD-10-CM

## 2012-05-08 ENCOUNTER — Emergency Department (HOSPITAL_BASED_OUTPATIENT_CLINIC_OR_DEPARTMENT_OTHER): Payer: BC Managed Care – PPO

## 2012-05-08 ENCOUNTER — Encounter (HOSPITAL_BASED_OUTPATIENT_CLINIC_OR_DEPARTMENT_OTHER): Payer: Self-pay | Admitting: *Deleted

## 2012-05-08 ENCOUNTER — Observation Stay (HOSPITAL_BASED_OUTPATIENT_CLINIC_OR_DEPARTMENT_OTHER)
Admission: EM | Admit: 2012-05-08 | Discharge: 2012-05-09 | Disposition: A | Discharge status: Home / Self Care | Payer: BC Managed Care – PPO | Attending: Internal Medicine | Admitting: Internal Medicine

## 2012-05-08 DIAGNOSIS — E669 Obesity, unspecified: Secondary | ICD-10-CM

## 2012-05-08 DIAGNOSIS — E039 Hypothyroidism, unspecified: Secondary | ICD-10-CM | POA: Insufficient documentation

## 2012-05-08 DIAGNOSIS — E785 Hyperlipidemia, unspecified: Secondary | ICD-10-CM

## 2012-05-08 DIAGNOSIS — R112 Nausea with vomiting, unspecified: Secondary | ICD-10-CM | POA: Insufficient documentation

## 2012-05-08 DIAGNOSIS — I482 Chronic atrial fibrillation, unspecified: Secondary | ICD-10-CM | POA: Diagnosis present

## 2012-05-08 DIAGNOSIS — R079 Chest pain, unspecified: Principal | ICD-10-CM | POA: Insufficient documentation

## 2012-05-08 DIAGNOSIS — I4891 Unspecified atrial fibrillation: Secondary | ICD-10-CM

## 2012-05-08 HISTORY — DX: Unspecified atrial fibrillation: I48.91

## 2012-05-08 LAB — CBC
MCH: 30.3 pg (ref 26.0–34.0)
MCV: 85.5 fL (ref 78.0–100.0)
Platelets: 170 10*3/uL (ref 150–400)
RBC: 5.02 MIL/uL (ref 4.22–5.81)
RDW: 13.1 % (ref 11.5–15.5)

## 2012-05-08 MED ORDER — ASPIRIN 81 MG PO CHEW
324.0000 mg | CHEWABLE_TABLET | Freq: Once | ORAL | Status: AC
  Administered 2012-05-08: 162 mg via ORAL
  Filled 2012-05-08: qty 4

## 2012-05-08 NOTE — ED Notes (Signed)
Pt. Did take 2 baby asp. This night due to chest pain.  To equal 162mg 

## 2012-05-08 NOTE — ED Notes (Signed)
Pt. Reports he did vomit and had the sudden onset of chest tightness.  Pt. Reports no pain at present time.  Pt. Has had EKG in ED now and is on monitor with A Fib noted.  Controlled rate and no shortness of breath noted.

## 2012-05-08 NOTE — ED Notes (Signed)
MD at bedside. 

## 2012-05-09 ENCOUNTER — Encounter (HOSPITAL_COMMUNITY): Payer: Self-pay | Admitting: Internal Medicine

## 2012-05-09 DIAGNOSIS — R079 Chest pain, unspecified: Secondary | ICD-10-CM

## 2012-05-09 DIAGNOSIS — I4891 Unspecified atrial fibrillation: Secondary | ICD-10-CM

## 2012-05-09 DIAGNOSIS — E039 Hypothyroidism, unspecified: Secondary | ICD-10-CM

## 2012-05-09 LAB — BASIC METABOLIC PANEL
CO2: 24 mEq/L (ref 19–32)
Calcium: 9.5 mg/dL (ref 8.4–10.5)
Creatinine, Ser: 1.1 mg/dL (ref 0.50–1.35)
GFR calc Af Amer: 82 mL/min — ABNORMAL LOW (ref >90)
GFR calc non Af Amer: 71 mL/min — ABNORMAL LOW (ref >90)
Sodium: 139 mEq/L (ref 135–145)

## 2012-05-09 LAB — COMPREHENSIVE METABOLIC PANEL
ALT: 11 U/L (ref 0–53)
Alkaline Phosphatase: 61 U/L (ref 39–117)
Chloride: 106 mEq/L (ref 96–112)
GFR calc Af Amer: 82 mL/min — ABNORMAL LOW (ref >90)
Glucose, Bld: 96 mg/dL (ref 70–99)
Potassium: 4.3 mEq/L (ref 3.5–5.1)
Sodium: 140 mEq/L (ref 135–145)
Total Bilirubin: 0.7 mg/dL (ref 0.3–1.2)
Total Protein: 6.6 g/dL (ref 6.0–8.3)

## 2012-05-09 LAB — LIPID PANEL
Total CHOL/HDL Ratio: 3.5 RATIO
VLDL: 11 mg/dL (ref 0–40)

## 2012-05-09 LAB — CBC
HCT: 43.3 % (ref 39.0–52.0)
MCHC: 34.2 g/dL (ref 30.0–36.0)
MCV: 87.7 fL (ref 78.0–100.0)
Platelets: 159 10*3/uL (ref 150–400)
RDW: 13.2 % (ref 11.5–15.5)

## 2012-05-09 LAB — TROPONIN I
Troponin I: <0.3 ng/mL (ref <0.30)
Troponin I: <0.3 ng/mL (ref <0.30)

## 2012-05-09 LAB — MAGNESIUM: Magnesium: 2 mg/dL (ref 1.5–2.5)

## 2012-05-09 MED ORDER — ASPIRIN EC 325 MG PO TBEC
325.0000 mg | DELAYED_RELEASE_TABLET | Freq: Every day | ORAL | Status: DC
  Administered 2012-05-09: 325 mg via ORAL
  Filled 2012-05-09: qty 1

## 2012-05-09 MED ORDER — SODIUM CHLORIDE 0.9 % IV SOLN: 250.0000 mL | INTRAVENOUS | Status: DC | PRN

## 2012-05-09 MED ORDER — SODIUM CHLORIDE 0.9 % IJ SOLN
3.0000 mL | Freq: Two times a day (BID) | INTRAMUSCULAR | Status: DC
  Administered 2012-05-09: 3 mL via INTRAVENOUS

## 2012-05-09 MED ORDER — SODIUM CHLORIDE 0.9 % IJ SOLN: 3.0000 mL | INTRAMUSCULAR | Status: DC | PRN

## 2012-05-09 MED ORDER — ENOXAPARIN SODIUM 40 MG/0.4ML ~~LOC~~ SOLN
40.0000 mg | SUBCUTANEOUS | Status: DC
  Administered 2012-05-09: 40 mg via SUBCUTANEOUS
  Filled 2012-05-09: qty 0.4

## 2012-05-09 MED ORDER — ACETAMINOPHEN 325 MG PO TABS: 650.0000 mg | ORAL_TABLET | Freq: Four times a day (QID) | ORAL | Status: DC | PRN

## 2012-05-09 MED ORDER — DOCUSATE SODIUM 100 MG PO CAPS
100.0000 mg | ORAL_CAPSULE | Freq: Two times a day (BID) | ORAL | Status: DC
  Administered 2012-05-09: 100 mg via ORAL
  Filled 2012-05-09 (×2): qty 1

## 2012-05-09 MED ORDER — LEVOTHYROXINE SODIUM 112 MCG PO TABS
112.0000 ug | ORAL_TABLET | Freq: Every day | ORAL | Status: DC
  Administered 2012-05-09: 112 ug via ORAL
  Filled 2012-05-09 (×2): qty 1

## 2012-05-09 MED ORDER — ACETAMINOPHEN 650 MG RE SUPP: 650.0000 mg | Freq: Four times a day (QID) | RECTAL | Status: DC | PRN

## 2012-05-09 MED ORDER — ONDANSETRON HCL 4 MG/2ML IJ SOLN: 4.0000 mg | Freq: Four times a day (QID) | INTRAMUSCULAR | Status: DC | PRN

## 2012-05-09 MED ORDER — ONDANSETRON HCL 4 MG PO TABS: 4.0000 mg | ORAL_TABLET | Freq: Four times a day (QID) | ORAL | Status: DC | PRN

## 2012-05-09 MED ORDER — SODIUM CHLORIDE 0.9 % IJ SOLN: 3.0000 mL | Freq: Two times a day (BID) | INTRAMUSCULAR | Status: DC

## 2012-05-09 MED ORDER — HYDROCODONE-ACETAMINOPHEN 5-325 MG PO TABS: 1.0000 | ORAL_TABLET | ORAL | Status: DC | PRN

## 2012-05-09 NOTE — ED Notes (Signed)
Assigned to room 3W06 @ [facility], RN Notified, [facility] called for transport.

## 2012-05-09 NOTE — ED Provider Notes (Signed)
History     CSN: [id number]  Arrival date & time 05/08/12  2321   First MD Initiated Contact with Patient 05/08/12 2336      Chief Complaint  Patient presents with  . Chest Pain    (Consider location/radiation/quality/duration/timing/severity/associated sxs/prior treatment) HPI Pt presents with c/o chest pain.  He states that he was at home and had acute onset of midsternal chest tightness associated with nausea.  Pt states he then vomited x 1.  Symptoms lasted several minutes, then resolved spontaneously.  No radiation of pain.  No diaphoresis.  Pt has hx of atrial fibrillation, no sensation of palpitaitons no dizziness or syncope.  No recent illnesses.  Currently denies any symptoms.  Symptoms not assoicated with exertion.  There are no other associated systemic symptoms, there are no other alleviating or modifying factors.   Past Medical History  Diagnosis Date  . Hyperlipidemia   . Thyroid disease     hypothyroidism  . Diverticulosis 2007  . Adenomatous colon polyp   . Atrial fibrillation 2012    Past Surgical History  Procedure Date  . Tonsillectomy   . Chest tube insertion 1982    Spontaneous Pneumothorax  . Colonoscopy w/ polypectomy 2007    Adenomatous Polyp; [facility], Dr Juanda Chance  . Transthoracic echocardiogram  12/2010,   . Holter monitor-48 hour 03/2011, 12/2010    Dr Sharrell Ku    Family History  Problem Relation Age of Onset  . Stroke Mother 55  . Breast cancer Mother   . Valvular heart disease Mother     valve replacement  . Hypertension Mother   . Cerebral aneurysm Sister 66  . Hypertension Sister   . Diabetes Neg Hx     History  Substance Use Topics  . Smoking status: Never Smoker   . Smokeless tobacco: Never Used  . Alcohol Use: No      Review of Systems ROS reviewed and all otherwise negative except for mentioned in HPI  Allergies  Review of patient's allergies indicates no known allergies.  Home Medications   Current Outpatient  Rx  Name  Route  Sig  Dispense  Refill  . ASPIRIN 81 MG PO TBEC   Oral   Take 162 mg by mouth daily.          Marland Kitchen FISH OIL PO   Oral   Take 2 capsules by mouth daily.          Marland Kitchen SYNTHROID 112 MCG PO TABS      TAKE ONE TABLET BY MOUTH AS DIRECTED. TAKE ONE TABLET DAILY EXCEPT ONE AND ONE-HALF ON SUNDAY.   34 each   3     BP 141/90  Pulse 81  Resp 16  Ht 6\' 2"  (1.88 m)  Wt 225 lb (102.059 kg)  BMI 28.89 kg/m2  SpO2 99% Vitals reviewed Physical Exam Physical Examination: General appearance - alert, well appearing, and in no distress Mental status - alert, oriented to person, place, and time Eyes - no conjunctival injection, no scleral icterus Mouth - mucous membranes moist, pharynx normal without lesions Chest - clear to auscultation, no wheezes, rales or rhonchi, symmetric air entry Heart - normal rate, regular rhythm, normal S1, S2, no murmurs, rubs, clicks or gallops Abdomen - soft, nontender, nondistended, no masses or organomegaly Extremities - peripheral pulses normal, no pedal edema, no clubbing or cyanosis Skin - normal coloration and turgor, no rashes  ED Course  Procedures (including critical care time)   Date: 05/09/2012  Rate: 65  Rhythm: atrial fibrillation  QRS Axis: normal  Intervals: indeterminate  ST/T Wave abnormalities: normal  Conduction Disutrbances:none  Narrative Interpretation:   Old EKG Reviewed: none available  1:19 AM  D/w [facility], Dr. Lovell Sheehan.  Pt to be transferred to [facility], telemetry bed, team 10 for cardiac r/o.    Labs Reviewed  BASIC METABOLIC PANEL - Abnormal; Notable for the following:    Glucose, Bld 107 (*)     GFR calc non Af Amer 71 (*)     GFR calc Af Amer 82 (*)     All other components within normal limits  CBC  TROPONIN I   Dg Chest 2 View  05/09/2012  *RADIOLOGY REPORT*  Clinical Data: Nausea and chest pain  CHEST - 2 VIEW  Comparison: None.  Findings: The heart size and pulmonary vascularity are normal. The lungs  appear clear and expanded without focal air space disease or consolidation. No blunting of the costophrenic angles.  No pneumothorax.  Mediastinal contours appear intact.  IMPRESSION: No evidence of active pulmonary disease.   Original Report Authenticated By: Burman Nieves, M.D.      1. Chest pain   2. Atrial fibrillation       MDM  Pt with hx afib presenting with episode of chest tightness.  Low concern for PE, due to story and not tachycardic.  Tropoinin negative.  CXR reassuring as well as EKG which shows rate controlled afib.  D/w patient and he is agreeable with plan for [facility]- will need serial cardiac enzymes and telemetry.  No chest pain here in the ED.         Ethelda Chick, MD 05/09/12 [phone]

## 2012-05-09 NOTE — Discharge Summary (Signed)
Physician Discharge Summary  Patient ID: Elijah [name] MRN: [id number] DOB/AGE: 61/03/1951 61 y.o.  Admit date: 05/08/2012 Discharge date: 05/09/2012  Primary Care Physician:  Marga Melnick, MD   Discharge Diagnoses:    Active Problems:  HYPOTHYROIDISM  Atrial fibrillation  Chest pain      Medication List     As of 05/09/2012 11:35 AM    TAKE these medications         aspirin 81 MG EC tablet   Take 162 mg by mouth daily.      FISH OIL PO   Take 2 capsules by mouth daily.      SYNTHROID 112 MCG tablet   Generic drug: levothyroxine   TAKE ONE TABLET BY MOUTH AS DIRECTED. TAKE ONE TABLET DAILY EXCEPT ONE AND ONE-HALF ON SUNDAY.         Disposition and Follow-up:  Will be discharged home today in stable and improved condition. Has been advised to keep his follow up appointment with Dr. Ladona Ridgel scheduled for 12/27. If he has continued nausea and lightheadedness, will need to make appointment sooner.  Consults:  None.   Significant Diagnostic Studies:  Dg Chest 2 View  05/09/2012  *RADIOLOGY REPORT*  Clinical Data: Nausea and chest pain  CHEST - 2 VIEW  Comparison: None.  Findings: The heart size and pulmonary vascularity are normal. The lungs appear clear and expanded without focal air space disease or consolidation. No blunting of the costophrenic angles.  No pneumothorax.  Mediastinal contours appear intact.  IMPRESSION: No evidence of active pulmonary disease.   Original Report Authenticated By: Burman Nieves, M.D.     Brief H and P: For complete details please refer to admission H and P, but in brief patient is a 61 y.o. male with past medical history of Hyperlipidemia; Thyroid disease; Diverticulosis (2007); Adenomatous colon polyp; and Atrial fibrillation (2012), who presented with : Yeasterday at 10 pm he was at rest and developed nausea and chest tightness associated with lightheadedness. All of this lasted just a few min. The lightheaded ness did  lasted for a about 30 min. He went to [facility] and was transferred to [facility]. He does have a hx of A.fib that is rate controlled without Use of betablockers and he was not on anticoagulation since he have not met the criteria. He also has a follow up appointment with his cardiologist in 2 weeks.  He currently is asymptomatic. He is very active and does not have chest pain with activity. HE walks or runs 3-5 days a week.  He was able to lose about 30 lb over past 3 months by exercising and chaniging eating habits. We have been asked to admit him for further evaluation and management.     Hospital Course:  Active Problems:  HYPOTHYROIDISM  Atrial fibrillation  Chest pain   Nausea, lightheadedness, CP -All resolved prior to coming to the hospital. -R/o for ACS with negative troponins and EKG without acute ischemic changes. -Suspect his symptoms may have been related to his a fib (?transient bradycardia-->none documented in the hospital).  Atrial Fibrillation -Controlled ventricular response. -Per patient has had an ECHO at the [facility] office in the last year. -Is on ASA. -CHADS score is 0. -Follow up with Dr. Ladona Ridgel as planned in December.   Time spent on Discharge: Greater than 30 minutes.  SignedChaya Jan [facility] Pager: [phone] 05/09/2012, 11:35 AM

## 2012-05-09 NOTE — ED Notes (Signed)
Pt wife [phone] Jude Naclerio [phone]

## 2012-05-09 NOTE — ED Notes (Signed)
Pt present with c/o n/v and SOB earlier tonight.  Pt reports "feeling weak"  Pt aaox3.  Pt hx atrial fib x1 yr ago.  Pt not taking any meds.  Pt denies chest pains or SOB at this time.  Pt family at bedside.

## 2012-05-09 NOTE — Progress Notes (Signed)
Pt arrived from [facility] via [facility].  Telemetry applied-pt is AFIB HR 66.  Vital signs stable and pt is without complaints at this time.  Flow Manager notified of pts arrival.  Will continue to monitor. Dierdre Highman

## 2012-05-09 NOTE — H&P (Signed)
PCP:    Marga Melnick, MD  Cardiogy Sharrell Ku with [facility]  Chief Complaint:   Chest dyscomfort  HPI: Elijah [name] is a 61 y.o. male   has a past medical history of Hyperlipidemia; Thyroid disease; Diverticulosis (2007); Adenomatous colon polyp; and Atrial fibrillation (2012).   Presented with  Elijah [name] at 10 pm he was at rest and developed nausea and chest pressure associated with lightheadedness. All of this lasted just a few min. The lightheaded ness did lasted for a about 30 min. He went to [facility] and was transferred to [facility]. He does have a hx of A.fib that is rate controlled without  Use of betablockers and he was not on anticoagulation since he have not met the criteria. He also has a follow up appointment with his cardiologist in 2 weeks.  He currently is asymptomatic. He is very active and does not have chest pain with activity. HE walks or runs 3-5 days a week.  He was able to lose about 30 lb over past 3 months by exercising and chaniging eating habits.   Review of Systems:     Pertinent positives include:chills, nausea, vomiting, chest pain, weight loss   Constitutional:  No weight loss, night sweats, Fevers,  fatigue,  HEENT:  No headaches, Difficulty swallowing,Tooth/dental problems,Sore throat,  No sneezing, itching, ear ache, nasal congestion, post nasal drip,  Cardio-vascular:  No , Orthopnea, PND, anasarca, dizziness, palpitations.no Bilateral lower extremity swelling  GI:  No heartburn, indigestion, abdominal pain,  diarrhea, change in bowel habits, loss of appetite, melena, blood in stool, hematemesis Resp:  no shortness of breath at rest. No dyspnea on exertion, No excess mucus, no productive cough, No non-productive cough, No coughing up of blood.No change in color of mucus.No wheezing. Skin:  no rash or lesions. No jaundice GU:  no dysuria, change in color of urine, no urgency or frequency. No straining to urinate.  No flank pain.  Musculoskeletal:  No  joint pain or no joint swelling. No decreased range of motion. No back pain.  Psych:  No change in mood or affect. No depression or anxiety. No memory loss.  Neuro: no localizing neurological complaints, no tingling, no weakness, no double vision, no gait abnormality, no slurred speech, no confusion  Otherwise ROS are negative except for above, 10 systems were reviewed  Past Medical History: Past Medical History  Diagnosis Date  . Hyperlipidemia   . Thyroid disease     hypothyroidism  . Diverticulosis 2007  . Adenomatous colon polyp   . Atrial fibrillation 2012   Past Surgical History  Procedure Date  . Tonsillectomy   . Chest tube insertion 1982    Spontaneous Pneumothorax  . Colonoscopy w/ polypectomy 2007    Adenomatous Polyp;  [facility], Dr Juanda Chance  . Transthoracic echocardiogram  12/2010,   . Holter monitor-48 hour 03/2011, 12/2010    Dr Sharrell Ku     Medications: Prior to Admission medications   Medication Sig Start Date End Date Taking? Authorizing Provider  aspirin 81 MG EC tablet Take 162 mg by mouth daily.    Yes Historical Provider, MD  Omega-3 Fatty Acids (FISH OIL PO) Take 2 capsules by mouth daily.    Yes Historical Provider, MD  SYNTHROID 112 MCG tablet TAKE ONE TABLET BY MOUTH AS DIRECTED. TAKE ONE TABLET DAILY EXCEPT ONE AND ONE-HALF ON SUNDAY. 02/16/12  Yes Pecola Lawless, MD    Allergies:  No Known Allergies  Social History:  Ambulatory  independently  Lives at  Home with family   reports that he has never smoked. He has never used smokeless tobacco. He reports that he does not drink alcohol or use illicit drugs.   Family History: family history includes Breast cancer in his mother; Cerebral aneurysm (age of onset:51) in his sister; Hypertension in his mother and sister; Stroke (age of onset:78) in his mother; and Valvular heart disease in his mother.  There is no history of Diabetes.    Physical Exam: Patient Vitals for the past 24 hrs:  BP  Temp Temp src Pulse Resp SpO2 Height Weight  05/09/12 0328 143/95 mmHg - - 64  16  100 % 6\' 2"  (1.88 m) 104.282 kg (229 lb 14.4 oz)  05/09/12 0237 131/93 mmHg - - 76  15  100 % - -  05/09/12 0200 127/93 mmHg - - - 12  - - -  05/09/12 0133 127/88 mmHg 98 F (36.7 C) Oral 70  14  100 % - -  05/09/12 0130 124/84 mmHg - - - - - - -  05/08/12 2329 141/90 mmHg - - 81  16  99 % 6\' 2"  (1.88 m) 102.059 kg (225 lb)    1. General:  in No Acute distress 2. Psychological: Alert and  Oriented 3. Head/ENT:   Moist  Mucous Membranes                          Head Non traumatic, neck supple                          Normal   Dentition 4. SKIN: normal  Skin turgor,  Skin clean Dry and intact no rash 5. Heart: Regular rate and rhythm no Murmur, Rub or gallop 6. Lungs: Clear to auscultation bilaterally, no wheezes or crackles   7. Abdomen: Soft, non-tender, Non distended 8. Lower extremities: no clubbing, cyanosis, or edema 9. Neurologically Grossly intact, moving all 4 extremities equally 10. MSK: Normal range of motion  body mass index is 29.52 kg/(m^2).   Labs on Admission:   [facility] 05/08/12 2350  NA 139  K 4.4  CL 104  CO2 24  GLUCOSE 107*  BUN 17  CREATININE 1.10  CALCIUM 9.5  MG --  PHOS --   No results found for this basename: AST:2,ALT:2,ALKPHOS:2,BILITOT:2,PROT:2,ALBUMIN:2 in the last 72 hours No results found for this basename: LIPASE:2,AMYLASE:2 in the last 72 hours  Basename 05/08/12 2350  WBC 7.9  NEUTROABS --  HGB 15.2  HCT 42.9  MCV 85.5  PLT 170    Basename 05/08/12 2350  CKTOTAL --  CKMB --  CKMBINDEX --  TROPONINI <0.30   No results found for this basename: TSH,T4TOTAL,FREET3,T3FREE,THYROIDAB in the last 72 hours No results found for this basename: VITAMINB12:2,FOLATE:2,FERRITIN:2,TIBC:2,IRON:2,RETICCTPCT:2 in the last 72 hours No results found for this basename: HGBA1C    Estimated Creatinine Clearance: 91.9 ml/min (by C-G formula based on Cr of  1.1). ABG No results found for this basename: phart, pco2, po2, hco3, tco2, acidbasedef, o2sat     No results found for this basename: DDIMER     Other results:  I have pearsonaly reviewed this: ECG REPORT  Rate: 65  Rhythm: a. fib ST&T Change: no ischemic changes   Cultures: No results found for this basename: sdes, specrequest, cult, reptstatus       Radiological Exams on Admission: Dg Chest 2 View  05/09/2012  *RADIOLOGY REPORT*  Clinical Data: Nausea and chest pain  CHEST - 2 VIEW  Comparison: None.  Findings: The heart size and pulmonary vascularity are normal. The lungs appear clear and expanded without focal air space disease or consolidation. No blunting of the costophrenic angles.  No pneumothorax.  Mediastinal contours appear intact.  IMPRESSION: No evidence of active pulmonary disease.   Original Report Authenticated By: Burman Nieves, M.D.     Chart has been reviewed  Assessment/Plan  61 yo w hx of A. Fib rate controlled off the betablocker here with chest discomfort at rest that has resolved.   Present on Admission:  . Chest pain - - given risk factors will admit, monitor on telemetry, cycle cardiac enzymes, obtain serial ECG. Further risk stratify with lipid panel, hgA1C, obtain TSH. Make sure patient is on Aspirin. Further treatment based on the currently pending results.   Marland Kitchen HYPOTHYROIDISM - continue home medications and check TSH . Atrial fibrillation - has a hx of this diagnosed last year. So far cardiology did not start him on anticoagulation. His current CHAD2 score is still 0. Will obtain echo to evaluate for wall motion abnormality or valvular disease to help with decision making.     Prophylaxis: Lovenox   CODE STATUS: FULL CODE  Other plan as per orders.  I have spent a total of 55 min on this admission  Cathern Tahir 05/09/2012, 4:24 AM

## 2012-05-09 NOTE — ED Notes (Signed)
Gave report to [facility]  

## 2012-05-09 NOTE — ED Notes (Signed)
Called [facility] to speak with RN to give report.  Per Diplomatic Services operational officer, RN unaware of patient coming.  RN name christina wants staff to call her back.

## 2012-05-13 ENCOUNTER — Ambulatory Visit (INDEPENDENT_AMBULATORY_CARE_PROVIDER_SITE_OTHER): Payer: BC Managed Care – PPO | Admitting: Internal Medicine

## 2012-05-13 ENCOUNTER — Encounter: Payer: Self-pay | Admitting: Internal Medicine

## 2012-05-13 VITALS — BP 118/70 | HR 84 | Temp 97.7°F | Wt 227.4 lb

## 2012-05-13 DIAGNOSIS — E785 Hyperlipidemia, unspecified: Secondary | ICD-10-CM

## 2012-05-13 DIAGNOSIS — I4891 Unspecified atrial fibrillation: Secondary | ICD-10-CM

## 2012-05-13 DIAGNOSIS — E039 Hypothyroidism, unspecified: Secondary | ICD-10-CM

## 2012-05-13 NOTE — Assessment & Plan Note (Signed)
As noted no cardiac injury was found. His rate is well controlled. This is most likely related to his excellent cardiovascular program

## 2012-05-13 NOTE — Assessment & Plan Note (Signed)
Lipids are at goal; no change indicated 

## 2012-05-13 NOTE — Progress Notes (Signed)
  Subjective:    Patient ID: Elijah [name], male    DOB: [birth year], 61 y.o.   MRN: [id number]  HPI He was hospitalized 11/22-23-2013 with nausea, dizziness and chest tightness. Hospital records were reviewed. Cardiac enzymes were negative x3. His GFR was minimally reduced at 70. Lipids were excellent. Glucose was also normal. TSH was within normal limits at 4.258. He has not changed his dose or mode of administration of the thyroid supplement.  Based on his CHADS 2 score; no change in therapy was recommended.  This event was in the context of some increased stress at work.  The dizziness was not associated with headache. His sister and mother both had CNS aneurysms which presented as sudden onset of headaches    Review of Systems Since discharge he has had  No recurrence of nausea, dizziness, or chest tightness. He is exercising at least 40 minutes 5 days a week without chest pain, palpitations, dyspnea, or claudication. He is on a heart healthy diet.     Objective:   Physical Exam Gen.: Healthy and well-nourished in appearance. Alert, appropriate and cooperative throughout exam. Head: Normocephalic without obvious abnormalities Eyes: No corneal or conjunctival inflammation noted. Pupils equal round reactive to light and accommodation. FOV of vision. Extraocular motion intact.  Neck: No deformities, masses, or tenderness noted. Range of motion &  Thyroid normal. Lungs: Normal respiratory effort; chest expands symmetrically. Lungs are clear to auscultation without rales, wheezes, or increased work of breathing. Heart: Slow, irregular rate and rhythm. No murmur. Abdomen: Bowel sounds normal; abdomen soft and nontender. No masses, organomegaly or hernias noted.No AAA                                                                              Musculoskeletal/extremities:  No clubbing, cyanosis, edema, or deformity noted. Tone & strength  normal. Vascular: Carotid, radial artery,  dorsalis pedis and  posterior tibial pulses are full and equal. No bruits present. Neurologic: Alert and oriented x3. Deep tendon reflexes symmetrical and normal.  Cranial nerve exam normal        Skin: Intact without suspicious lesions or rashes. Lymph: No cervical, axillary lymphadenopathy present. Psych: Mood and affect are normal. Normally interactive                                                                                         Assessment & Plan:

## 2012-05-13 NOTE — Assessment & Plan Note (Addendum)
TSH is in the upper limits of therapeutic range; I will not increase  thyroid dose because of atrial fibrillation. I will request a TSH recheck in 6 months.

## 2012-05-13 NOTE — Patient Instructions (Addendum)
If you develop headaches, numbness, tingling, or weakness in the extremities; I would want to pursue a CT scan because of your family history. Discuss family history with your Ophthalmologist; this may impact the exam she performs.

## 2012-06-02 ENCOUNTER — Encounter: Payer: Self-pay | Admitting: Internal Medicine

## 2012-06-02 ENCOUNTER — Ambulatory Visit (INDEPENDENT_AMBULATORY_CARE_PROVIDER_SITE_OTHER): Payer: BC Managed Care – PPO | Admitting: Internal Medicine

## 2012-06-02 VITALS — BP 121/86 | HR 74 | Ht 74.0 in | Wt 223.4 lb

## 2012-06-02 DIAGNOSIS — R110 Nausea: Secondary | ICD-10-CM | POA: Insufficient documentation

## 2012-06-02 DIAGNOSIS — I4891 Unspecified atrial fibrillation: Secondary | ICD-10-CM

## 2012-06-02 DIAGNOSIS — E669 Obesity, unspecified: Secondary | ICD-10-CM

## 2012-06-02 NOTE — Assessment & Plan Note (Signed)
The patient has lost approximately 30 pounds with diet and exercise. I've encouraged him to continue.

## 2012-06-02 NOTE — Progress Notes (Signed)
HPI [name] returns today for followup. He is a very pleasant 61 year old man with a history of chronic atrial fibrillation, who was recently seen in the emergency room with nausea and chest discomfort. He ruled out for MI. His nausea resolved. His cardiac enzymes were negative. He is well-known, preserved left ventricular function. He denies fevers or chills. Since leaving the hospital a couple of weeks ago, he has continued to have mild episodes of nausea. No syncope, cough, or hemoptysis. No Known Allergies   Current Outpatient Prescriptions  Medication Sig Dispense Refill  . aspirin 81 MG EC tablet Take 162 mg by mouth daily.       . Omega-3 Fatty Acids (FISH OIL PO) Take 2 capsules by mouth daily.       Marland Kitchen SYNTHROID 112 MCG tablet TAKE ONE TABLET BY MOUTH AS DIRECTED. TAKE ONE TABLET DAILY EXCEPT ONE AND ONE-HALF ON SUNDAY.  34 each  3     Past Medical History  Diagnosis Date  . Hyperlipidemia   . Thyroid disease     hypothyroidism  . Diverticulosis 2007  . Adenomatous colon polyp   . Atrial fibrillation 2012    Dr Lewayne Bunting seen as OP  . Atrial fibrillation 11/22-23/2013    presentation as nausea, dizziness & chest tightness    ROS:   All systems reviewed and negative except as noted in the HPI.   Past Surgical History  Procedure Date  . Tonsillectomy   . Chest tube insertion 1982    Spontaneous Pneumothorax  . Colonoscopy w/ polypectomy 2007    Adenomatous Polyp; [facility], Dr Juanda Chance  . Transthoracic echocardiogram  12/2010,   . Holter monitor-48 hour 03/2011, 12/2010    Dr Sharrell Ku     Family History  Problem Relation Age of Onset  . Stroke Mother 75    cns aneurysm bleed X2  . Breast cancer Mother   . Valvular heart disease Mother     valve replacement  . Hypertension Mother   . Cerebral aneurysm Sister 73  . Hypertension Sister   . Diabetes Neg Hx      History   Social History  . Marital Status: Married    Spouse Name: N/A    Number of  Children: N/A  . Years of Education: N/A   Occupational History  . Not on file.   Social History Main Topics  . Smoking status: Never Smoker   . Smokeless tobacco: Never Used  . Alcohol Use: No  . Drug Use: No  . Sexually Active: Not on file   Other Topics Concern  . Not on file   Social History Narrative  . No narrative on file     BP 121/86  Pulse 74  Ht 6\' 2"  (1.88 m)  Wt 223 lb 6.4 oz (101.334 kg)  BMI 28.68 kg/m2  Physical Exam:  Well appearing 61 year old man, NAD HEENT: Unremarkable Neck:  No JVD, no thyromegally Lungs:  Clear with no wheezes, rales, or rhonchi. HEART:  Regular rate rhythm, no murmurs, no rubs, no clicks Abd:  soft, positive bowel sounds, no organomegally, no rebound, no guarding Ext:  2 plus pulses, no edema, no cyanosis, no clubbing Skin:  No rashes no nodules Neuro:  CN II through XII intact, motor grossly intact  EKG Atrial fibrillation with a controlled ventricular response   Assess/Plan:

## 2012-06-02 NOTE — Assessment & Plan Note (Signed)
His nausea is improved but he is still having intermittent episodes. I wonder if he has gallbladder disease. He has also had chest discomfort, noncardiac. I've recommended abdominal ultrasound to look at his gallbladder. If he has gallstones, he will be referred to a gastroenterologist.

## 2012-06-02 NOTE — Patient Instructions (Addendum)
Your physician wants you to follow-up in: 12 months with Dr Court Joy will receive a reminder letter in the mail two months in advance. If you don't receive a letter, please call our office to schedule the follow-up appointment.     Ultarasound of abdomen ---r/o Gallstones

## 2012-06-02 NOTE — Assessment & Plan Note (Signed)
His ventricular rate is well controlled. No change in medical therapy. 

## 2012-06-06 ENCOUNTER — Ambulatory Visit (INDEPENDENT_AMBULATORY_CARE_PROVIDER_SITE_OTHER): Payer: BC Managed Care – PPO | Admitting: Family Medicine

## 2012-06-06 VITALS — BP 118/82 | HR 64 | Temp 97.6°F | Resp 16 | Ht 73.0 in | Wt 228.0 lb

## 2012-06-06 DIAGNOSIS — L03031 Cellulitis of right toe: Secondary | ICD-10-CM

## 2012-06-06 DIAGNOSIS — L0390 Cellulitis, unspecified: Secondary | ICD-10-CM

## 2012-06-06 DIAGNOSIS — L03039 Cellulitis of unspecified toe: Secondary | ICD-10-CM

## 2012-06-06 DIAGNOSIS — L0291 Cutaneous abscess, unspecified: Secondary | ICD-10-CM

## 2012-06-06 MED ORDER — DOXYCYCLINE HYCLATE 100 MG PO TABS: 100.0000 mg | ORAL_TABLET | Freq: Two times a day (BID) | ORAL | Status: DC

## 2012-06-06 MED ORDER — TRAMADOL HCL 50 MG PO TABS: 50.0000 mg | ORAL_TABLET | Freq: Three times a day (TID) | ORAL | Status: DC | PRN

## 2012-06-06 NOTE — Progress Notes (Signed)
[facility]:  Office Visit  Chief Complaint:  Chief Complaint  Patient presents with  . Toe Pain    Rt foot- 4th toe    HPI: Elijah [name] is a 61 y.o. male who complains of  1 day h/o right foot infection of 4th toe. Swelling, pain, redness, has not tried anything for it. Can't walk or wear shoe. Has a h/o fungal infection in toe nails.   Past Medical History  Diagnosis Date  . Hyperlipidemia   . Thyroid disease     hypothyroidism  . Diverticulosis 2007  . Adenomatous colon polyp   . Atrial fibrillation 2012    Dr Lewayne Bunting seen as OP  . Atrial fibrillation 11/22-23/2013    presentation as nausea, dizziness & chest tightness   Past Surgical History  Procedure Date  . Tonsillectomy   . Chest tube insertion 1982    Spontaneous Pneumothorax  . Colonoscopy w/ polypectomy 2007    Adenomatous Polyp; [facility], Dr Juanda Chance  . Transthoracic echocardiogram  12/2010,   . Holter monitor-48 hour 03/2011, 12/2010    Dr Sharrell Ku   History   Social History  . Marital Status: Married    Spouse Name: N/A    Number of Children: N/A  . Years of Education: N/A   Social History Main Topics  . Smoking status: Never Smoker   . Smokeless tobacco: Never Used  . Alcohol Use: No  . Drug Use: No  . Sexually Active: None   Other Topics Concern  . None   Social History Narrative  . None   Family History  Problem Relation Age of Onset  . Stroke Mother 35    cns aneurysm bleed X2  . Breast cancer Mother   . Valvular heart disease Mother     valve replacement  . Hypertension Mother   . Cerebral aneurysm Sister 5  . Hypertension Sister   . Diabetes Neg Hx    No Known Allergies Prior to Admission medications   Medication Sig Start Date End Date Taking? Authorizing Provider  aspirin 81 MG EC tablet Take 162 mg by mouth daily.    Yes Historical Provider, MD  Omega-3 Fatty Acids (FISH OIL PO) Take 2 capsules by mouth daily.    Yes Historical Provider,  MD  SYNTHROID 112 MCG tablet TAKE ONE TABLET BY MOUTH AS DIRECTED. TAKE ONE TABLET DAILY EXCEPT ONE AND ONE-HALF ON SUNDAY. 02/16/12  Yes Pecola Lawless, MD     ROS: The patient denies fevers, chills, night sweats, unintentional weight loss, chest pain, palpitations, wheezing, dyspnea on exertion, nausea, vomiting, abdominal pain, dysuria, hematuria, melena, numbness, weakness, or tingling.  All other systems have been reviewed and were otherwise negative with the exception of those mentioned in the HPI and as above.    PHYSICAL EXAM: Filed Vitals:   06/06/12 1844  BP: 118/82  Pulse: 64  Temp: 97.6 F (36.4 C)  Resp: 16   Filed Vitals:   06/06/12 1844  Height: 6\' 1"  (1.854 m)  Weight: 228 lb (103.42 kg)   Body mass index is 30.08 kg/(m^2).  General: Alert, no acute distress HEENT:  Normocephalic, atraumatic, oropharynx patent.  Cardiovascular:  Regular rate and rhythm, no rubs murmurs or gallops.  No Carotid bruits, radial pulse intact. No pedal edema.  Respiratory: Clear to auscultation bilaterally.  No wheezes, rales, or rhonchi.  No cyanosis, no use of accessory musculature GI: No organomegaly, abdomen is soft and non-tender, positive bowel  sounds.  No masses. Skin: + paronchyia right 4th toe Neurologic: Facial musculature symmetric. Psychiatric: Patient is appropriate throughout our interaction. Lymphatic: No cervical lymphadenopathy Musculoskeletal: Gait intact.   LABS: Results for orders placed during the hospital encounter of 05/08/12  CBC      Component Value Range   WBC 7.9  4.0 - 10.5 K/uL   RBC 5.02  4.22 - 5.81 MIL/uL   Hemoglobin 15.2  13.0 - 17.0 g/dL   HCT 16.1  09.6 - 04.5 %   MCV 85.5  78.0 - 100.0 fL   MCH 30.3  26.0 - 34.0 pg   MCHC 35.4  30.0 - 36.0 g/dL   RDW 40.9  81.1 - 91.4 %   Platelets 170  150 - 400 K/uL  BASIC METABOLIC PANEL      Component Value Range   Sodium 139  135 - 145 mEq/L   Potassium 4.4  3.5 - 5.1 mEq/L   Chloride 104  96 -  112 mEq/L   CO2 24  19 - 32 mEq/L   Glucose, Bld 107 (*) 70 - 99 mg/dL   BUN 17  6 - 23 mg/dL   Creatinine, Ser 7.82  0.50 - 1.35 mg/dL   Calcium 9.5  8.4 - 95.6 mg/dL   GFR calc non Af Amer 71 (*) >90 mL/min   GFR calc Af Amer 82 (*) >90 mL/min  TROPONIN I      Component Value Range   Troponin I <0.30  <0.30 ng/mL  MAGNESIUM      Component Value Range   Magnesium 2.0  1.5 - 2.5 mg/dL  PHOSPHORUS      Component Value Range   Phosphorus 3.2  2.3 - 4.6 mg/dL  TSH      Component Value Range   TSH 4.258  0.350 - 4.500 uIU/mL  COMPREHENSIVE METABOLIC PANEL      Component Value Range   Sodium 140  135 - 145 mEq/L   Potassium 4.3  3.5 - 5.1 mEq/L   Chloride 106  96 - 112 mEq/L   CO2 29  19 - 32 mEq/L   Glucose, Bld 96  70 - 99 mg/dL   BUN 14  6 - 23 mg/dL   Creatinine, Ser 2.13  0.50 - 1.35 mg/dL   Calcium 9.2  8.4 - 08.6 mg/dL   Total Protein 6.6  6.0 - 8.3 g/dL   Albumin 3.5  3.5 - 5.2 g/dL   AST 26  0 - 37 U/L   ALT 11  0 - 53 U/L   Alkaline Phosphatase 61  39 - 117 U/L   Total Bilirubin 0.7  0.3 - 1.2 mg/dL   GFR calc non Af Amer 70 (*) >90 mL/min   GFR calc Af Amer 82 (*) >90 mL/min  CBC      Component Value Range   WBC 6.1  4.0 - 10.5 K/uL   RBC 4.94  4.22 - 5.81 MIL/uL   Hemoglobin 14.8  13.0 - 17.0 g/dL   HCT 57.8  46.9 - 62.9 %   MCV 87.7  78.0 - 100.0 fL   MCH 30.0  26.0 - 34.0 pg   MCHC 34.2  30.0 - 36.0 g/dL   RDW 52.8  41.3 - 24.4 %   Platelets 159  150 - 400 K/uL  TROPONIN I      Component Value Range   Troponin I <0.30  <0.30 ng/mL  TROPONIN I      Component Value  Range   Troponin I <0.30  <0.30 ng/mL  LIPID PANEL      Component Value Range   Cholesterol 151  0 - 200 mg/dL   Triglycerides 57  <147 mg/dL   HDL 43  >82 mg/dL   Total CHOL/HDL Ratio 3.5     VLDL 11  0 - 40 mg/dL   LDL Cholesterol 97  0 - 99 mg/dL     EKG/XRAY:   Primary read interpreted by Dr. Conley Rolls at [facility].   ASSESSMENT/PLAN: Encounter Diagnoses  Name Primary?  . Paronychia  of fourth toe, right Yes  . Cellulitis    Rx doxycycline, tramadol prn pain Wound culture pending  Wound care and f/u as directed F/u prn    [name], DO 06/06/2012 7:57 PM

## 2012-06-06 NOTE — Progress Notes (Signed)
  Subjective:    Patient ID: Elijah [name], male    DOB: Sep 13, 1950, 61 y.o.   MRN: [id number]  HPI    Review of Systems     Objective:   Physical Exam   Consent obtained-1% plain lidocaine locally.  #11 blade to open at most fluctuant area. +purulent drainage. Culture taken.      Assessment & Plan:  Saline soaks and wound care discussed

## 2012-06-11 LAB — WOUND CULTURE
Gram Stain: NONE SEEN
Gram Stain: NONE SEEN

## 2012-06-29 ENCOUNTER — Ambulatory Visit (HOSPITAL_COMMUNITY)
Admission: RE | Admit: 2012-06-29 | Discharge: 2012-06-29 | Disposition: A | Discharge status: Home / Self Care | Payer: BC Managed Care – PPO | Source: Ambulatory Visit | Attending: Internal Medicine | Admitting: Internal Medicine

## 2012-06-29 DIAGNOSIS — R110 Nausea: Secondary | ICD-10-CM | POA: Insufficient documentation

## 2012-07-10 ENCOUNTER — Other Ambulatory Visit: Payer: Self-pay | Admitting: Internal Medicine

## 2012-07-10 MED ORDER — LEVOTHYROXINE SODIUM 112 MCG PO TABS: ORAL_TABLET | ORAL | Status: DC

## 2012-07-10 NOTE — Telephone Encounter (Signed)
refill SYNTHROID 112 MCG TAKE ONE TABLET BY MOUTH AS DIRECTED. TAKE ONE TABLET DAILY EXCEPT ONE AND ONE-HALF ON SUNDAY. #34 last fill 12.22.13

## 2012-07-10 NOTE — Telephone Encounter (Signed)
RX sent

## 2012-12-17 ENCOUNTER — Other Ambulatory Visit: Payer: Self-pay | Admitting: Internal Medicine

## 2013-01-18 ENCOUNTER — Other Ambulatory Visit: Payer: Self-pay | Admitting: Internal Medicine

## 2013-04-22 ENCOUNTER — Other Ambulatory Visit: Payer: Self-pay

## 2013-05-29 IMAGING — US US ABDOMEN COMPLETE
1 series · 14 of 25 positions shown · non-contrast
Comparison: No comparison studies available.

CLINICAL DATA: Nausea

ABDOMEN ULTRASOUND
TECHNIQUE: Complete abdominal ultrasound examination was performed
including evaluation of the liver, gallbladder, bile ducts,
pancreas, kidneys, spleen, IVC, and abdominal aorta.

[Series 1: us abdomen complete · 0.31mm/px · 14 of 72 slices shown]
[im 1/72]
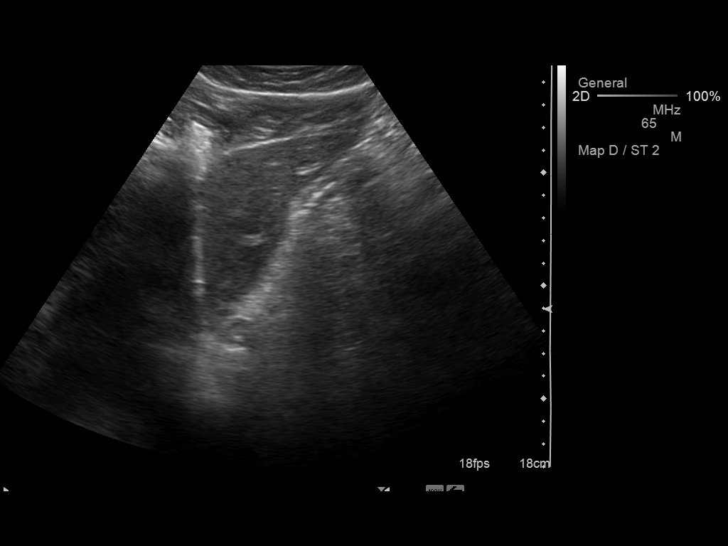
[im 6/72]
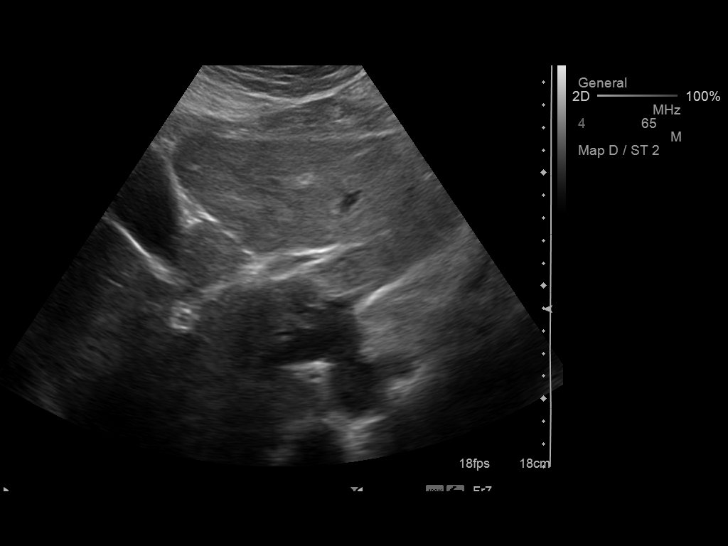
[im 12/72]
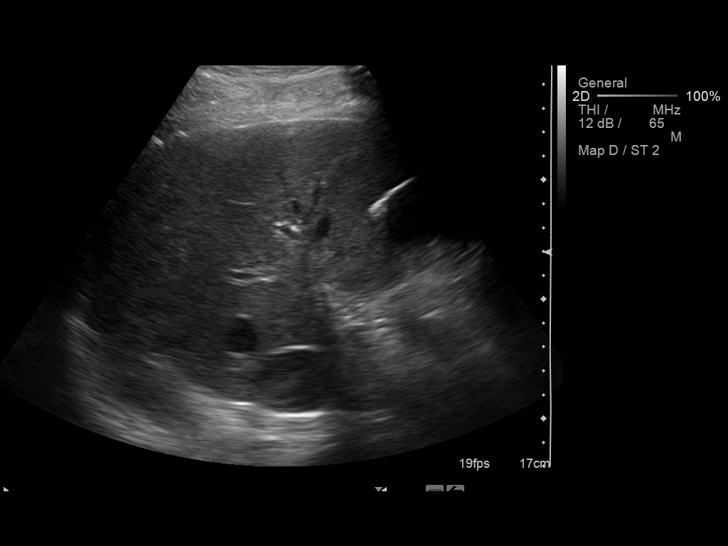
[im 18/72]
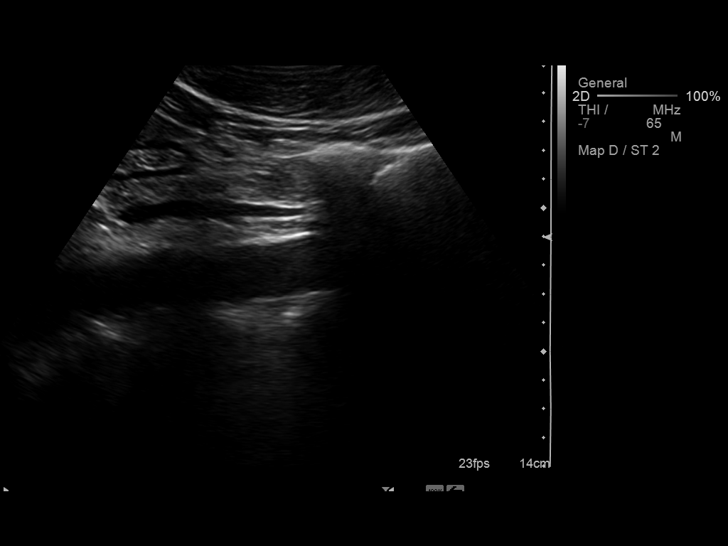
[im 24/72]
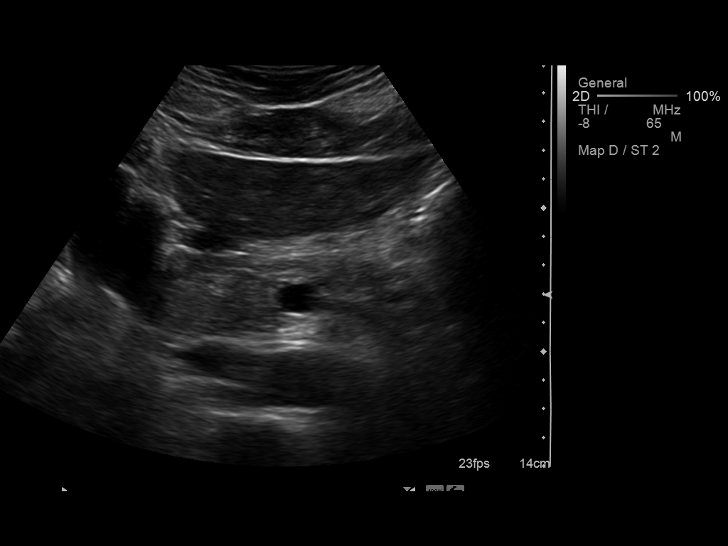
[im 27/72]
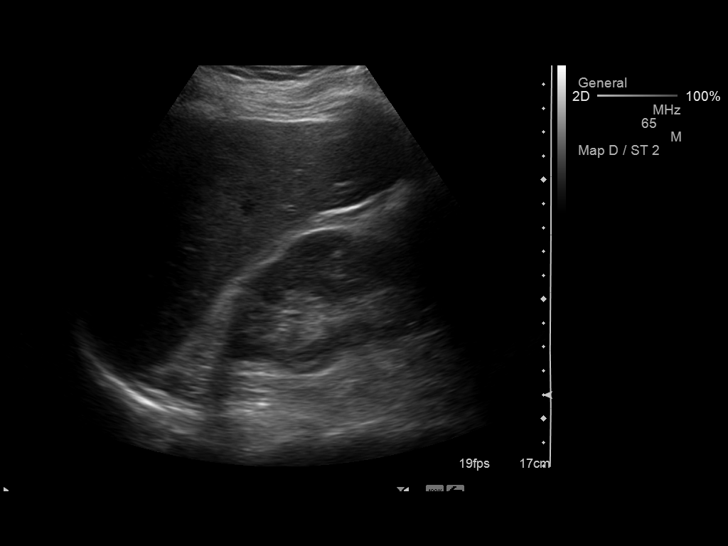
[im 33/72]
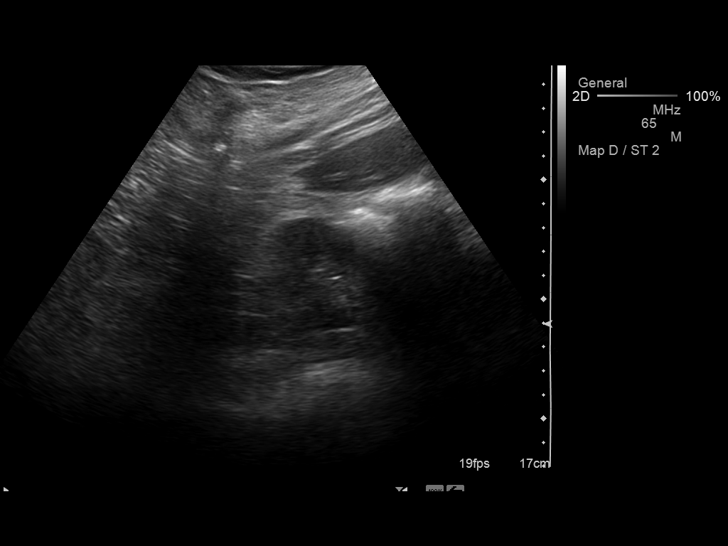
[im 39/72]
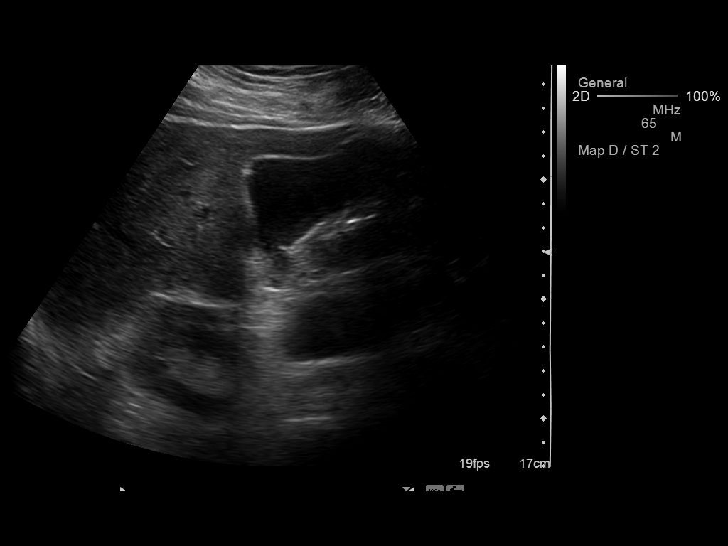
[im 45/72]
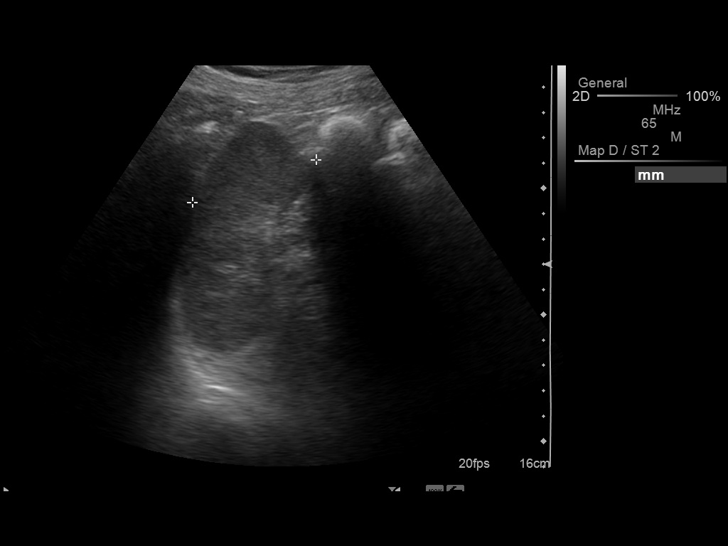
[im 48/72]
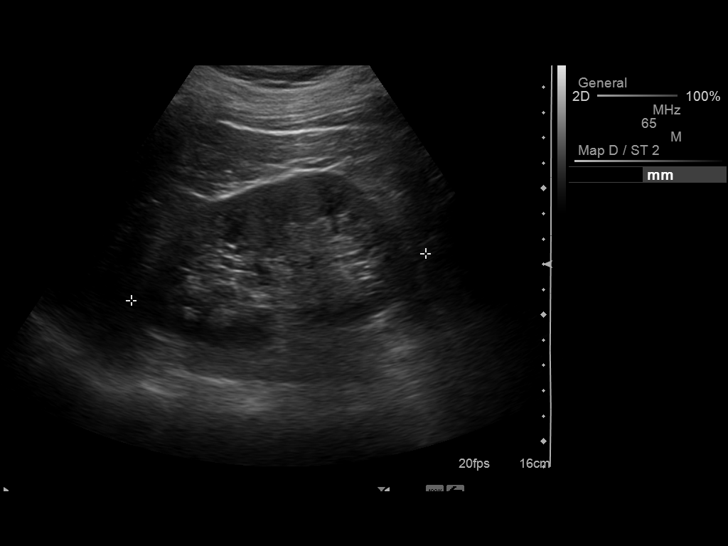
[im 54/72]
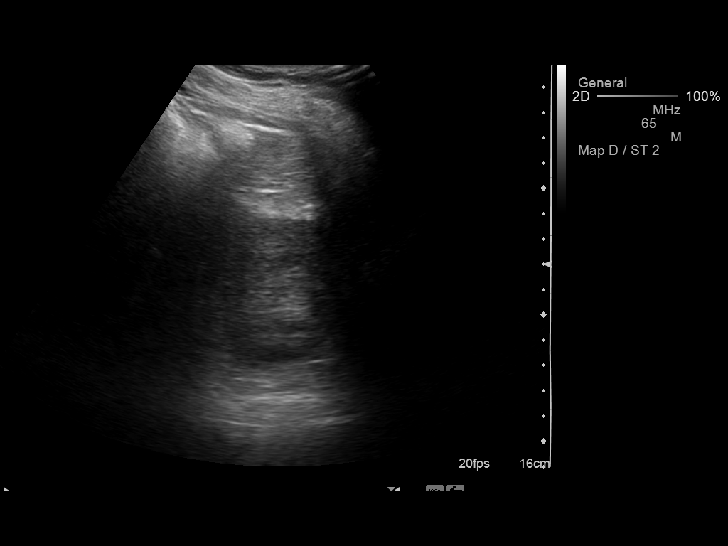
[im 60/72]
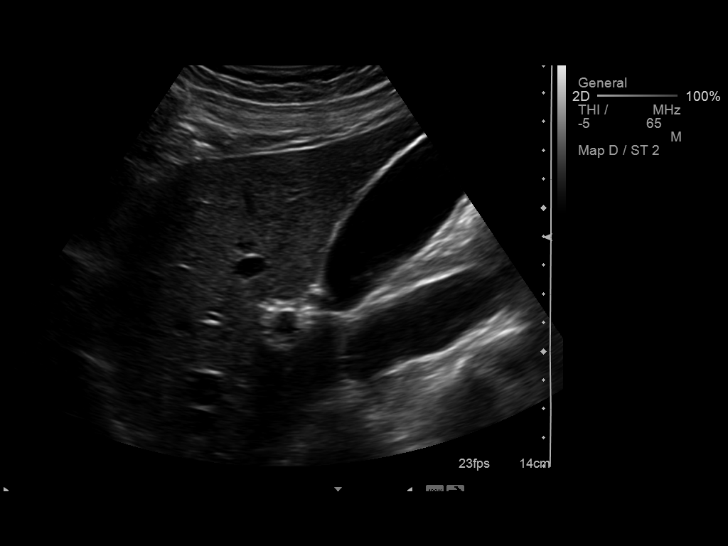
[im 66/72]
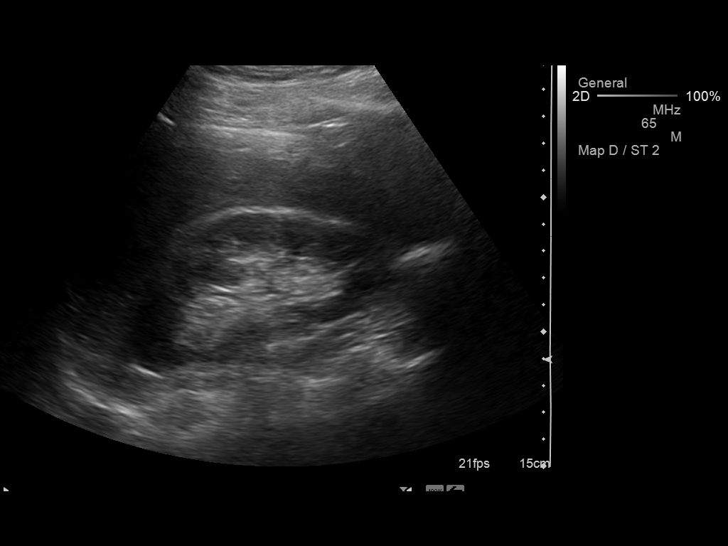
[im 72/72]
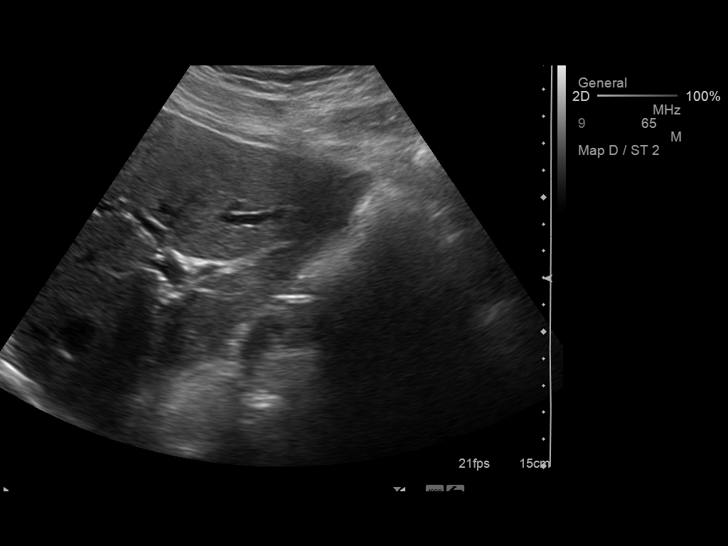

[14 of 25 positions shown; findings below may reference images not displayed]

FINDINGS: Gallbladder:  There is no evidence for gallstones.  No gallbladder
wall thickening or pericholecystic fluid.  The sonographer reports
no sonographic Murphy's sign.

Common Bile Duct:  Normal diameter at 3 - 4 mm

Liver:  Normal.  No focal parenchymal abnormality.  No biliary
dilation.

IVC:  Normal.

Pancreas:  Normal.

Spleen:  Normal.

Right kidney:  11.0 cm in long axis.  Normal.

Left kidney:  11.9 cm in long axis.  Normal.

Abdominal Aorta:  No aneurysm visualized although distal aorta
obscured by overlying bowel gas.
IMPRESSION: Normal abdominal ultrasound.

## 2013-06-03 ENCOUNTER — Ambulatory Visit: Payer: BC Managed Care – PPO | Admitting: Internal Medicine

## 2013-06-03 ENCOUNTER — Ambulatory Visit (INDEPENDENT_AMBULATORY_CARE_PROVIDER_SITE_OTHER): Payer: BC Managed Care – PPO | Admitting: Internal Medicine

## 2013-06-03 ENCOUNTER — Encounter: Payer: Self-pay | Admitting: Internal Medicine

## 2013-06-03 VITALS — BP 128/72 | HR 98 | Ht 74.0 in | Wt 225.8 lb

## 2013-06-03 DIAGNOSIS — I4891 Unspecified atrial fibrillation: Secondary | ICD-10-CM

## 2013-06-03 NOTE — Patient Instructions (Signed)
Your physician wants you to follow-up in: 12 months with Dr. Taylor. You will receive a reminder letter in the mail two months in advance. If you don't receive a letter, please call our office to schedule the follow-up appointment.    

## 2013-06-03 NOTE — Progress Notes (Signed)
HPI [name] returns today for followup. He is a very pleasant 62 year old man with a history of chronic atrial fibrillation, and preserved left ventricular function. He denies fevers or chills. He has had no chest pain or sob. No syncope.  No Known Allergies   Current Outpatient Prescriptions  Medication Sig Dispense Refill  . aspirin 81 MG EC tablet Take 162 mg by mouth daily.       Marland Kitchen levothyroxine (SYNTHROID, LEVOTHROID) 112 MCG tablet TAKE 1 TABLET BY MOUTH ONE TIME DAILY. EXCEPT ON SUNDAYS, TAKE 1 1/2 TABLETS BY MOUTH.  34 tablet  3  . Omega-3 Fatty Acids (FISH OIL PO) Take 2 capsules by mouth daily.        No current facility-administered medications for this visit.     Past Medical History  Diagnosis Date  . Hyperlipidemia   . Thyroid disease     hypothyroidism  . Diverticulosis 2007  . Adenomatous colon polyp   . Atrial fibrillation 2012    Dr Lewayne Bunting seen as OP  . Atrial fibrillation 11/22-23/2013    presentation as nausea, dizziness & chest tightness    ROS:   All systems reviewed and negative except as noted in the HPI.   Past Surgical History  Procedure Laterality Date  . Tonsillectomy    . Chest tube insertion  1982    Spontaneous Pneumothorax  . Colonoscopy w/ polypectomy  2007    Adenomatous Polyp; [facility], Dr Juanda Chance  . Transthoracic echocardiogram   12/2010,   . Holter monitor-48 hour  03/2011, 12/2010    Dr Sharrell Ku     Family History  Problem Relation Age of Onset  . Stroke Mother 28    cns aneurysm bleed X2  . Breast cancer Mother   . Valvular heart disease Mother     valve replacement  . Hypertension Mother   . Cerebral aneurysm Sister 47  . Hypertension Sister   . Diabetes Neg Hx      History   Social History  . Marital Status: Married    Spouse Name: N/A    Number of Children: N/A  . Years of Education: N/A   Occupational History  . Not on file.   Social History Main Topics  . Smoking status: Never Smoker   .  Smokeless tobacco: Never Used  . Alcohol Use: No  . Drug Use: No  . Sexual Activity: Not on file   Other Topics Concern  . Not on file   Social History Narrative  . No narrative on file     BP 128/72  Pulse 98  Ht 6\' 2"  (1.88 m)  Wt 225 lb 12.8 oz (102.422 kg)  BMI 28.98 kg/m2  Physical Exam:  Well appearing 62 year old man, NAD HEENT: Unremarkable Neck:  No JVD, no thyromegally Lungs:  Clear with no wheezes, rales, or rhonchi. HEART:  IRegular rate rhythm, no murmurs, no rubs, no clicks Abd:  soft, positive bowel sounds, no organomegally, no rebound, no guarding Ext:  2 plus pulses, no edema, no cyanosis, no clubbing Skin:  No rashes no nodules Neuro:  CN II through XII intact, motor grossly intact  EKG Atrial fibrillation with a controlled ventricular response   Assess/Plan:

## 2013-06-03 NOTE — Assessment & Plan Note (Signed)
His ventricular rate is well controlled and he is asymptomatic. He is encouraged to continue his regular daily exercise. He  Does not have indication for systemic anticoagulation at this time.

## 2013-09-30 ENCOUNTER — Ambulatory Visit (INDEPENDENT_AMBULATORY_CARE_PROVIDER_SITE_OTHER): Payer: BC Managed Care – PPO | Admitting: Family Medicine

## 2013-09-30 VITALS — BP 120/68 | HR 69 | Temp 98.1°F | Resp 16 | Ht 74.0 in | Wt 232.0 lb

## 2013-09-30 DIAGNOSIS — M79609 Pain in unspecified limb: Secondary | ICD-10-CM

## 2013-09-30 DIAGNOSIS — M79676 Pain in unspecified toe(s): Secondary | ICD-10-CM

## 2013-09-30 DIAGNOSIS — IMO0002 Reserved for concepts with insufficient information to code with codable children: Secondary | ICD-10-CM

## 2013-09-30 MED ORDER — DOXYCYCLINE HYCLATE 100 MG PO TABS: 100.0000 mg | ORAL_TABLET | Freq: Two times a day (BID) | ORAL | Status: DC

## 2013-09-30 NOTE — Progress Notes (Signed)
Chief Complaint:  Chief Complaint  Patient presents with  . Toe Pain    right great toe x 1 week, off and on    HPI: Elijah [name] is a 63 y.o. male who is here for 1 week history of right great toe pain, he had swelling. He denies fevers and chills.  Has a h/o paronychia in the past. He also has A fib and hypothyroid .Denies DM.   Past Medical History  Diagnosis Date  . Hyperlipidemia   . Thyroid disease     hypothyroidism  . Diverticulosis 2007  . Adenomatous colon polyp   . Atrial fibrillation 2012    Dr Elijah [name] seen as OP  . Atrial fibrillation 11/22-23/2013    presentation as nausea, dizziness & chest tightness   Past Surgical History  Procedure Laterality Date  . Tonsillectomy    . Chest tube insertion  1982    Spontaneous Pneumothorax  . Colonoscopy w/ polypectomy  2007    Adenomatous Polyp; [facility], Dr Elijah [name]  . Transthoracic echocardiogram   12/2010,   . Holter monitor-48 hour  03/2011, 12/2010    Dr Elijah [name]   History   Social History  . Marital Status: Married    Spouse Name: N/A    Number of Children: N/A  . Years of Education: N/A   Social History Main Topics  . Smoking status: Never Smoker   . Smokeless tobacco: Never Used  . Alcohol Use: No  . Drug Use: No  . Sexual Activity: None   Other Topics Concern  . None   Social History Narrative  . None   Family History  Problem Relation Age of Onset  . Stroke Mother 31    cns aneurysm bleed X2  . Breast cancer Mother   . Valvular heart disease Mother     valve replacement  . Hypertension Mother   . Cerebral aneurysm Sister 77  . Hypertension Sister   . Diabetes Neg Hx    No Known Allergies Prior to Admission medications   Medication Sig Start Date End Date Taking? Authorizing Provider  aspirin 81 MG EC tablet Take 162 mg by mouth daily.    Yes Historical Provider, MD  levothyroxine (SYNTHROID, LEVOTHROID) 112 MCG tablet TAKE 1 TABLET BY MOUTH ONE TIME DAILY.  EXCEPT ON SUNDAYS, TAKE 1 1/2 TABLETS BY MOUTH. 01/18/13  Yes Elijah Limes, MD  Omega-3 Fatty Acids (FISH OIL PO) Take 2 capsules by mouth daily.    Yes Historical Provider, MD     ROS: The patient denies fevers, chills, night sweats, unintentional weight loss, chest pain, palpitations, wheezing, dyspnea on exertion, nausea, vomiting, abdominal pain, dysuria, hematuria, melena, numbness, weakness, or tingling.   All other systems have been reviewed and were otherwise negative with the exception of those mentioned in the HPI and as above.    PHYSICAL EXAM: Filed Vitals:   09/30/13 2008  BP: 120/68  Pulse: 69  Temp: 98.1 F (36.7 C)  Resp: 16   Filed Vitals:   09/30/13 2008  Height: 6\' 2"  (1.88 m)  Weight: 232 lb (105.235 kg)   Body mass index is 29.77 kg/(m^2).  General: Alert, no acute distress HEENT:  Normocephalic, atraumatic, oropharynx patent. EOMI, PERRLA Cardiovascular:  IRREGULAR , no rubs murmurs or gallops.  No Carotid bruits, radial pulse intact. No pedal edema.  Respiratory: Clear to auscultation bilaterally.  No wheezes, rales, or rhonchi.  No cyanosis, no use of accessory musculature  GI: No organomegaly, abdomen is soft and non-tender, positive bowel sounds.  No masses. Skin: No rashes. Neurologic: Facial musculature symmetric. Psychiatric: Patient is appropriate throughout our interaction. Lymphatic: No cervical lymphadenopathy Musculoskeletal: Gait intact. Right great toes-+ fluctaunce , + redness, minimally tender + DP, + oncyhomycosis toe nails    LABS: Results for orders placed in visit on 06/06/12  WOUND CULTURE      Result Value Ref Range   Culture       Value: Abundant STAPHYLOCOCCUS AUREUS     Moderate GROUP B STREP (S.AGALACTIAE) ISOLATED   Gram Stain No WBC Seen     Gram Stain No Squamous Epithelial Cells Seen     Gram Stain Few Gram Positive Cocci In Pairs In Clusters     Organism ID, Bacteria STAPHYLOCOCCUS AUREUS     Organism ID, Bacteria  Moderate GROUP B STREP (S.AGALACTIAE) ISOLATED       EKG/XRAY:   Primary read interpreted by Dr. Marin [name] at [facility].   ASSESSMENT/PLAN: Encounter Diagnoses  Name Primary?  . Paronychia Yes  . Toe pain    Mr Elijah [name] is a very pleasant 63 y/o gentleman in very good health, he does have a history of rate controlled Afib and he is only on ASA for this since per patient his CHADS2 score was so low. He present with  A 1 week hx of Right great toe paronychia. He has had this problem before but on the 4th toe. We attempted to IandD but no pus came out even though it was very fluctuant.  Rx Doxycycline BID x 10 days Declined pain meds F/u prn  Gross sideeffects, risk and benefits, and alternatives of medications d/w patient. Patient is aware that all medications have potential sideeffects and we are unable to predict every sideeffect or drug-drug interaction that may occur.  Elijah Bayley, DO 09/30/2013 8:52 PM

## 2013-09-30 NOTE — Progress Notes (Signed)
Procedure:  Consent obtained.  Digital block with marcaine and lidocaine.  Anesthesia obtained.  Betadine prep.  #11 blade used to make an incision along the proximal lateral nail edge - no purulence expressed.  Drsg placed. Wound care d/w pt

## 2013-09-30 NOTE — Patient Instructions (Signed)
Paronychia Paronychia is an inflammatory reaction involving the folds of the skin surrounding the fingernail. This is commonly caused by an infection in the skin around a nail. The most common cause of paronychia is frequent wetting of the hands (as seen with bartenders, food servers, nurses or others who wet their hands). This makes the skin around the fingernail susceptible to infection by bacteria (germs) or fungus. Other predisposing factors are:  Aggressive manicuring.  Nail biting.  Thumb sucking. The most common cause is a staphylococcal (a type of germ) infection, or a fungal (Candida) infection. When caused by a germ, it usually comes on suddenly with redness, swelling, pus and is often painful. It may get under the nail and form an abscess (collection of pus), or form an abscess around the nail. If the nail itself is infected with a fungus, the treatment is usually prolonged and may require oral medicine for up to one year. Your caregiver will determine the length of time treatment is required. The paronychia caused by bacteria (germs) may largely be avoided by not pulling on hangnails or picking at cuticles. When the infection occurs at the tips of the finger it is called felon. When the cause of paronychia is from the herpes simplex virus (HSV) it is called herpetic whitlow. TREATMENT  When an abscess is present treatment is often incision and drainage. This means that the abscess must be cut open so the pus can get out. When this is done, the following home care instructions should be followed. HOME CARE INSTRUCTIONS   It is important to keep the affected fingers very dry. Rubber or plastic gloves over cotton gloves should be used whenever the hand must be placed in water.  Keep wound clean, dry and dressed as suggested by your caregiver between warm soaks or warm compresses.  Soak in warm water for fifteen to twenty minutes three to four times per day for bacterial infections. Fungal  infections are very difficult to treat, so often require treatment for long periods of time.  For bacterial (germ) infections take antibiotics (medicine which kill germs) as directed and finish the prescription, even if the problem appears to be solved before the medicine is gone.  Only take over-the-counter or prescription medicines for pain, discomfort, or fever as directed by your caregiver. SEEK IMMEDIATE MEDICAL CARE IF:  You have redness, swelling, or increasing pain in the wound.  You notice pus coming from the wound.  You have a fever.  You notice a bad smell coming from the wound or dressing. Document Released: 11/27/2000 Document Revised: 08/26/2011 Document Reviewed: 07/29/2008 Exit[employer] Patient Information 2014 [employer].  

## 2013-10-03 LAB — WOUND CULTURE
Gram Stain: NONE SEEN
Gram Stain: NONE SEEN

## 2013-10-19 ENCOUNTER — Other Ambulatory Visit: Payer: Self-pay | Admitting: Internal Medicine

## 2013-11-14 ENCOUNTER — Other Ambulatory Visit: Payer: Self-pay | Admitting: Internal Medicine

## 2013-11-22 ENCOUNTER — Encounter: Payer: Self-pay | Admitting: Internal Medicine

## 2013-11-22 ENCOUNTER — Ambulatory Visit (INDEPENDENT_AMBULATORY_CARE_PROVIDER_SITE_OTHER): Payer: BC Managed Care – PPO | Admitting: Internal Medicine

## 2013-11-22 VITALS — BP 110/76 | HR 75 | Temp 97.6°F | Resp 12 | Ht 75.0 in | Wt 234.6 lb

## 2013-11-22 DIAGNOSIS — E785 Hyperlipidemia, unspecified: Secondary | ICD-10-CM

## 2013-11-22 DIAGNOSIS — Z1211 Encounter for screening for malignant neoplasm of colon: Secondary | ICD-10-CM

## 2013-11-22 DIAGNOSIS — D126 Benign neoplasm of colon, unspecified: Secondary | ICD-10-CM

## 2013-11-22 DIAGNOSIS — Z0000 Encounter for general adult medical examination without abnormal findings: Secondary | ICD-10-CM

## 2013-11-22 DIAGNOSIS — I4891 Unspecified atrial fibrillation: Secondary | ICD-10-CM

## 2013-11-22 DIAGNOSIS — E039 Hypothyroidism, unspecified: Secondary | ICD-10-CM

## 2013-11-22 MED ORDER — LEVOTHYROXINE SODIUM 112 MCG PO TABS: ORAL_TABLET | ORAL | Status: DC

## 2013-11-22 NOTE — Progress Notes (Signed)
Pre visit review using our clinic review tool, if applicable. No additional management support is needed unless otherwise documented below in the visit note. 

## 2013-11-22 NOTE — Patient Instructions (Signed)
Your next office appointment will be determined based upon review of your pending labs . Those instructions will be transmitted to you through My Chart   As per the Standard of Care , screening Colonoscopy recommended @ 50 & every 5-10 years thereafter . More frequent monitor would be dictated by family history or findings @ Colonoscopy 

## 2013-11-22 NOTE — Progress Notes (Signed)
   Subjective:    Patient ID: Elijah [name], male    DOB: 12/29/1950, 63 y.o.   MRN: [id number]  HPI  He is here for a physical;acute issues denied. Overdue for colonoscopy.   Review of Systems Unexplained weight loss, abdominal pain, significant dyspepsia, dysphagia, melena, rectal bleeding, or persistently small caliber stools are denied.       Objective:   Physical Exam Gen.: Healthy and well-nourished in appearance. Alert, appropriate and cooperative throughout exam. Appears younger than stated age  Head: Normocephalic without obvious abnormalities; minor crown alopecia  Eyes: No corneal or conjunctival inflammation noted. Pupils equal round reactive to light and accommodation. Extraocular motion intact.  Ears: External  ear exam reveals no significant lesions or deformities. Canals clear .TMs normal. Hearing is grossly normal bilaterally. Nose: External nasal exam reveals no deformity or inflammation. Nasal mucosa are pink and moist. No lesions or exudates noted.   Mouth: Oral mucosa and oropharynx reveal no lesions or exudates. Teeth in good repair. Neck: No deformities, masses, or tenderness noted. Range of motion &Thyroid normal. Lungs: Normal respiratory effort; chest expands symmetrically. Lungs are clear to auscultation without rales, wheezes, or increased work of breathing. Heart: slow rate and subtley irregular rhythm. Normal S1 and S2. No gallop, click, or rub. No murmur. Abdomen: Bowel sounds normal; abdomen soft and nontender. No masses, organomegaly or hernias noted. Genitalia: Genitalia normal except for left varices. Prostate is normal without enlargement, asymmetry, nodularity, or induration                                   Musculoskeletal/extremities: No deformity or scoliosis noted of  the thoracic or lumbar spine. No clubbing, cyanosis, edema, or significant extremity  deformity noted. Range of motion normal .Tone & strength normal. Hand joints  normal Fingernail  health good. Crepitus L knee. Able to lie down & sit up w/o help. Negative SLR bilaterally Vascular: Carotid, radial artery, dorsalis pedis and  posterior tibial pulses are full and equal. No bruits present. Neurologic: Alert and oriented x3. Deep tendon reflexes symmetrical and normal.  Gait normal  .       Skin: Intact without suspicious lesions or rashes. Lymph: No cervical, axillary, or inguinal lymphadenopathy present. Psych: Mood and affect are normal. Normally interactive                                                                                        Assessment & Plan:  #1 comprehensive physical exam; no acute findings  Plan: see Orders  & Recommendations

## 2013-11-24 ENCOUNTER — Other Ambulatory Visit (INDEPENDENT_AMBULATORY_CARE_PROVIDER_SITE_OTHER): Payer: BC Managed Care – PPO

## 2013-11-24 DIAGNOSIS — Z0000 Encounter for general adult medical examination without abnormal findings: Secondary | ICD-10-CM

## 2013-11-24 LAB — CBC WITH DIFFERENTIAL/PLATELET
BASOS PCT: 0.4 % (ref 0.0–3.0)
Basophils Absolute: 0 10*3/uL (ref 0.0–0.1)
EOS ABS: 0.1 10*3/uL (ref 0.0–0.7)
Eosinophils Relative: 2.1 % (ref 0.0–5.0)
HEMATOCRIT: 48.7 % (ref 39.0–52.0)
HEMOGLOBIN: 16.3 g/dL (ref 13.0–17.0)
Lymphocytes Relative: 43.3 % (ref 12.0–46.0)
Lymphs Abs: 2.8 10*3/uL (ref 0.7–4.0)
MCHC: 33.4 g/dL (ref 30.0–36.0)
MCV: 89.6 fl (ref 78.0–100.0)
MONO ABS: 0.6 10*3/uL (ref 0.1–1.0)
Monocytes Relative: 9.6 % (ref 3.0–12.0)
Neutro Abs: 2.9 10*3/uL (ref 1.4–7.7)
Neutrophils Relative %: 44.6 % (ref 43.0–77.0)
Platelets: 190 10*3/uL (ref 150.0–400.0)
RBC: 5.43 Mil/uL (ref 4.22–5.81)
RDW: 13.8 % (ref 11.5–15.5)
WBC: 6.5 10*3/uL (ref 4.0–10.5)

## 2013-11-24 LAB — BASIC METABOLIC PANEL
BUN: 26 mg/dL — ABNORMAL HIGH (ref 6–23)
CHLORIDE: 105 meq/L (ref 96–112)
CO2: 27 mEq/L (ref 19–32)
Calcium: 9 mg/dL (ref 8.4–10.5)
Creatinine, Ser: 1.2 mg/dL (ref 0.4–1.5)
GFR: 67.7 mL/min (ref >60.00)
Glucose, Bld: 93 mg/dL (ref 70–99)
POTASSIUM: 4.3 meq/L (ref 3.5–5.1)
Sodium: 137 mEq/L (ref 135–145)

## 2013-11-24 LAB — LIPID PANEL
Cholesterol: 195 mg/dL (ref 0–200)
HDL: 50.4 mg/dL (ref >39.00)
LDL Cholesterol: 135 mg/dL — ABNORMAL HIGH (ref 0–99)
NONHDL: 144.6
Total CHOL/HDL Ratio: 4
Triglycerides: 46 mg/dL (ref 0.0–149.0)
VLDL: 9.2 mg/dL (ref 0.0–40.0)

## 2013-11-24 LAB — HEPATIC FUNCTION PANEL
ALBUMIN: 3.9 g/dL (ref 3.5–5.2)
ALT: 16 U/L (ref 0–53)
AST: 34 U/L (ref 0–37)
Alkaline Phosphatase: 57 U/L (ref 39–117)
BILIRUBIN DIRECT: 0.1 mg/dL (ref 0.0–0.3)
Total Bilirubin: 1 mg/dL (ref 0.2–1.2)
Total Protein: 7 g/dL (ref 6.0–8.3)

## 2013-11-24 LAB — TSH: TSH: 4.37 u[IU]/mL (ref 0.35–4.50)

## 2013-11-27 ENCOUNTER — Other Ambulatory Visit: Payer: Self-pay | Admitting: Internal Medicine

## 2013-11-27 DIAGNOSIS — E039 Hypothyroidism, unspecified: Secondary | ICD-10-CM

## 2013-11-28 ENCOUNTER — Other Ambulatory Visit: Payer: Self-pay | Admitting: Internal Medicine

## 2013-11-30 ENCOUNTER — Encounter: Payer: Self-pay | Admitting: Internal Medicine

## 2013-11-30 ENCOUNTER — Telehealth: Payer: Self-pay | Admitting: Internal Medicine

## 2013-11-30 NOTE — Telephone Encounter (Signed)
Yes, he is due for colonoscopy. Last exam 06/2005, adenomatous polyp removed. So it is time to have a repeat.

## 2013-11-30 NOTE — Telephone Encounter (Signed)
Patient's PCP sent referral for colonoscopy. Patient is asking if he is due for recall. Please, advise.

## 2013-11-30 NOTE — Telephone Encounter (Signed)
LM w/wife for Pt to CB to sch'd his colonoscopy

## 2013-11-30 NOTE — Telephone Encounter (Signed)
Patient is due for repeat colonoscopy due to adenomatous polyp removed on 06/2005. Please, schedule.

## 2014-04-02 ENCOUNTER — Ambulatory Visit (INDEPENDENT_AMBULATORY_CARE_PROVIDER_SITE_OTHER): Payer: BC Managed Care – PPO | Admitting: Internal Medicine

## 2014-04-02 ENCOUNTER — Encounter: Payer: Self-pay | Admitting: Internal Medicine

## 2014-04-02 VITALS — BP 110/72 | Temp 97.8°F | Wt 249.0 lb

## 2014-04-02 DIAGNOSIS — B9789 Other viral agents as the cause of diseases classified elsewhere: Principal | ICD-10-CM

## 2014-04-02 DIAGNOSIS — J069 Acute upper respiratory infection, unspecified: Secondary | ICD-10-CM

## 2014-04-02 MED ORDER — HYDROCODONE-HOMATROPINE 5-1.5 MG/5ML PO SYRP: ORAL_SOLUTION | ORAL | Status: DC

## 2014-04-02 NOTE — Progress Notes (Signed)
Chief Complaint  Patient presents with  . Cough    congestion, mucus-yellow, fatigue, headache x 1 week     HPI: Patient comes in today for [facility] Saturday clinic for  new problem evaluation. Onset about a week ago and impairing sleep  yellow .phlegm at times from Cough  NOLungNO asthma .   Trying cough drops  atnight q hours . Ok to exercise in day and not that bad . Mild congestin. ROS: See pertinent positives and negatives per HPI.  Past Medical History  Diagnosis Date  . Hyperlipidemia   . Thyroid disease     hypothyroidism  . Diverticulosis 2007  . Adenomatous colon polyp   . Atrial fibrillation 2012    Dr Cristopher Peru seen as OP  . Atrial fibrillation 11/22-23/2013    presentation as nausea, dizziness & chest tightness    Family History  Problem Relation Age of Onset  . Stroke Mother 62    cns aneurysm bleed X2  . Breast cancer Mother   . Valvular heart disease Mother     valve replacement  . Hypertension Mother   . Cerebral aneurysm Sister 4  . Hypertension Sister   . Diabetes Neg Hx   . Hypertension Father     History   Social History  . Marital Status: Married    Spouse Name: N/A    Number of Children: N/A  . Years of Education: N/A   Social History Main Topics  . Smoking status: Never Smoker   . Smokeless tobacco: Never Used  . Alcohol Use: No  . Drug Use: No  . Sexual Activity: None   Other Topics Concern  . None   Social History Narrative  . None    Outpatient Encounter Prescriptions as of 04/02/2014  Medication Sig  . aspirin 81 MG EC tablet Take 162 mg by mouth daily.   . Omega-3 Fatty Acids (FISH OIL PO) Take 2 capsules by mouth daily.   Marland Kitchen SYNTHROID 112 MCG tablet TAKE ONE TABLET DAILY, EXCEPT ON SUNDAYS TAKE 1 AND 1/2 TABLETS  . [DISCONTINUED] levothyroxine (SYNTHROID, LEVOTHROID) 112 MCG tablet TAKE 1 TABLET BY MOUTH ONE TIME DAILY. EXCEPT ON TUES,TH & SUN, TAKE 1 1/2 TABLETS BY MOUTH.  . [DISCONTINUED] levothyroxine (SYNTHROID,  LEVOTHROID) 112 MCG tablet TAKE 1 TABLET BY MOUTH DAILY, EXCEPT ON SUNDAYS TAKE 1 1/2 TABLETS  . HYDROcodone-homatropine (HYCODAN) 5-1.5 MG/5ML syrup 1-2 tsp  POat night prn cough or up to every 4 hours if needed    EXAM:  BP 110/72  Temp(Src) 97.8 F (36.6 C) (Oral)  Wt 249 lb (112.946 kg)  Body mass index is 31.12 kg/(m^2).  GENERAL: vitals reviewed and listed above, alert, oriented, appears well hydrated and in no acute distress minor cough  HEENT: atraumatic, conjunctiva  clear, no obvious abnormalities on inspection of external nose and ears OP : no lesion edema or exudate  mod upper congestion no face pain  NECK: no obvious masses on inspection palpation  LUNGS: clear to auscultation bilaterally, no wheezes, rales or rhonchi, good air movement CV: HRRR, no clubbing cyanosis  nl cap refill  MS: moves all extremities without noticeable focal  abnormality PSYCH: pleasant and cooperative, no obvious depression or anxiety  ASSESSMENT AND PLAN:  Discussed the following assessment and plan:  Viral upper respiratory tract infection with cough  -Patient advised to return or notify health care team  if symptoms worsen ,persist or new concerns arise.  Patient Instructions  Stop the cough drops .  Can use sugar free candy . Cough med at night for  comfort. Chest exam in clear but if get fever  Relapsing sx  Shortness of breath pain blood , etc contact us for reevaluation.  Cough can last up to 3 + weeks but should feel much better in a week,    Wanda K. Panosh M.D.

## 2014-04-02 NOTE — Patient Instructions (Signed)
Stop the cough drops .  Can use sugar free candy . Cough med at night for  comfort. Chest exam in clear but if get fever  Relapsing sx  Shortness of breath pain blood , etc contact us for reevaluation.  Cough can last up to 3 + weeks but should feel much better in a week,

## 2014-04-02 NOTE — Progress Notes (Signed)
   Subjective:    Patient ID: Elijah [name], male    DOB: Jun 27, 1950, 63 y.o.   MRN: [id number]  HPI    Review of Systems     Objective:   Physical Exam        Assessment & Plan:  Pre visit review using our clinic review tool, if applicable. No additional management support is needed unless otherwise documented below in the visit note.

## 2014-06-21 ENCOUNTER — Ambulatory Visit: Payer: BC Managed Care – PPO | Admitting: Internal Medicine

## 2014-07-15 ENCOUNTER — Encounter: Payer: Self-pay | Admitting: Internal Medicine

## 2014-07-15 ENCOUNTER — Ambulatory Visit (INDEPENDENT_AMBULATORY_CARE_PROVIDER_SITE_OTHER): Payer: BLUE CROSS/BLUE SHIELD | Admitting: Internal Medicine

## 2014-07-15 VITALS — BP 126/80 | HR 73 | Ht 75.0 in | Wt 248.2 lb

## 2014-07-15 DIAGNOSIS — I482 Chronic atrial fibrillation, unspecified: Secondary | ICD-10-CM

## 2014-07-15 DIAGNOSIS — I481 Persistent atrial fibrillation: Secondary | ICD-10-CM

## 2014-07-15 DIAGNOSIS — I4819 Other persistent atrial fibrillation: Secondary | ICD-10-CM

## 2014-07-15 NOTE — Patient Instructions (Signed)
Your physician wants you to follow-up in: 2 years with Dr. Lovena Le. You will receive a reminder letter in the mail two months in advance. If you don't receive a letter, please call our office to schedule the follow-up appointment.  Your physician recommends that you continue on your current medications as directed. Please refer to the Current Medication list given to you today.

## 2014-07-15 NOTE — Progress Notes (Signed)
HPI [name] returns today for followup. He is a very pleasant 64 year old man with a history of chronic atrial fibrillation, and preserved left ventricular function. He denies fevers or chills. He has had no chest pain or sob. No syncope. He is exercising 4-5 times daily. He has gained 15 lbs in the past year. No Known Allergies   Current Outpatient Prescriptions  Medication Sig Dispense Refill  . aspirin 81 MG EC tablet Take 162 mg by mouth daily.     Marland Kitchen levothyroxine (SYNTHROID, LEVOTHROID) 112 MCG tablet Take 1 tablet by mouth daily except tale 1.5 tablets by mouth on Tuesday and Thursdays    . Omega-3 Fatty Acids (FISH OIL PO) Take 2 capsules by mouth daily.      No current facility-administered medications for this visit.     Past Medical History  Diagnosis Date  . Hyperlipidemia   . Thyroid disease     hypothyroidism  . Diverticulosis 2007  . Adenomatous colon polyp   . Atrial fibrillation 2012    Dr Cristopher Peru seen as OP  . Atrial fibrillation 11/22-23/2013    presentation as nausea, dizziness & chest tightness    ROS:   All systems reviewed and negative except as noted in the HPI.   Past Surgical History  Procedure Laterality Date  . Tonsillectomy    . Chest tube insertion  1982    Spontaneous Pneumothorax  . Colonoscopy w/ polypectomy  2007    Adenomatous Polyp; [facility], Dr Olevia Perches  . Transthoracic echocardiogram   12/2010,   . Holter monitor-48 hour  03/2011, 12/2010    Dr Crissie Sickles     Family History  Problem Relation Age of Onset  . Stroke Mother 29    cns aneurysm bleed X2  . Breast cancer Mother   . Valvular heart disease Mother     valve replacement  . Hypertension Mother   . Cerebral aneurysm Sister 105  . Hypertension Sister   . Diabetes Neg Hx   . Hypertension Father      History   Social History  . Marital Status: Married    Spouse Name: N/A    Number of Children: N/A  . Years of Education: N/A   Occupational History  .  Not on file.   Social History Main Topics  . Smoking status: Never Smoker   . Smokeless tobacco: Never Used  . Alcohol Use: No  . Drug Use: No  . Sexual Activity: Not on file   Other Topics Concern  . Not on file   Social History Narrative     BP 126/80 mmHg  Pulse 73  Ht 6\' 3"  (1.905 m)  Wt 248 lb 3.2 oz (112.583 kg)  BMI 31.02 kg/m2  Physical Exam:  Well appearing 64 year old man, NAD HEENT: Unremarkable Neck:  No JVD, no thyromegally Lungs:  Clear with no wheezes, rales, or rhonchi. HEART:  IRegular rate rhythm, no murmurs, no rubs, no clicks Abd:  soft, positive bowel sounds, no organomegally, no rebound, no guarding Ext:  2 plus pulses, no edema, no cyanosis, no clubbing Skin:  No rashes no nodules Neuro:  CN II through XII intact, motor grossly intact  EKG Atrial fibrillation with a controlled ventricular response   Assess/Plan:

## 2014-07-15 NOTE — Assessment & Plan Note (Signed)
He has gained 15 lbs in the past year. I have asked him to push back from the table. He is exercising 4-5 times a week. He is encouraged to reduce the fat content of his diet.

## 2014-07-15 NOTE — Assessment & Plan Note (Signed)
He continues to be asymptomatic. He will continue his current medical therapy with ASA. Will discuss systemic anti-coagulation when he returns to see me in 2 years.

## 2014-12-21 ENCOUNTER — Encounter: Payer: Self-pay | Admitting: Internal Medicine

## 2014-12-22 ENCOUNTER — Ambulatory Visit (INDEPENDENT_AMBULATORY_CARE_PROVIDER_SITE_OTHER): Payer: BLUE CROSS/BLUE SHIELD | Admitting: Internal Medicine

## 2014-12-22 ENCOUNTER — Encounter: Payer: Self-pay | Admitting: Internal Medicine

## 2014-12-22 VITALS — BP 130/88 | HR 73 | Temp 97.5°F | Resp 16 | Ht 74.0 in | Wt 250.0 lb

## 2014-12-22 DIAGNOSIS — I482 Chronic atrial fibrillation, unspecified: Secondary | ICD-10-CM

## 2014-12-22 DIAGNOSIS — D126 Benign neoplasm of colon, unspecified: Secondary | ICD-10-CM

## 2014-12-22 DIAGNOSIS — E785 Hyperlipidemia, unspecified: Secondary | ICD-10-CM

## 2014-12-22 DIAGNOSIS — E039 Hypothyroidism, unspecified: Secondary | ICD-10-CM

## 2014-12-22 DIAGNOSIS — Z0000 Encounter for general adult medical examination without abnormal findings: Secondary | ICD-10-CM

## 2014-12-22 NOTE — Patient Instructions (Signed)
  Your next office appointment will be determined based upon review of your pending labs. Those written interpretation of the lab results and instructions will be transmitted to you by My Chart  Critical results will be called.   Followup as needed for any active or acute issue. Please report any significant change in your symptoms. 

## 2014-12-22 NOTE — Progress Notes (Signed)
   Subjective:    Patient ID: Elijah [name], male    DOB: 12-Sep-1950, 64 y.o.     MRN: [id number] HPI  He is here for a physical;acute issues include fungal nail changes.  He has been compliant with his medicines without adverse effects. He does not restrict fried foods or red meats. He exercises 220 minutes per week without cardiopulmonary symptoms.  He saw Dr. Lovena Le in December 2 015 and was told to come back in 2 years .At this time the focus is on rate control of the chronic atrial fibrillation area .He is clinically unaware of the dysrhythmia.  He had an adenomatous polyp in 2007. He has not followed up in 2012 as recommended. The standard care of was discussed with him. He denies any GI symptoms at this time.  Review of Systems   He  does have nocturia once nightly but no other symptoms. Chest pain, palpitations, tachycardia, exertional dyspnea, paroxysmal nocturnal dyspnea, claudication or edema are absent. No unexplained weight loss, abdominal pain, significant dyspepsia, dysphagia, melena, rectal bleeding, or persistently small caliber stools. Dysuria, pyuria, hematuria, frequency, or polyuria are denied. Change in hair, skin, nails denied. No bowel changes of constipation or diarrhea. No intolerance to heat or cold.      Objective:   Physical Exam  Pertinent or positive findings include:  Pattern alopecia is present. He has a mustache.  Heart rhythm is slow but irregular. He has thickened deformed toenails. He has nonpitting ankle edema. Varices are present in the left scrotum. Prostate is 2+ enlarged without nodularity or induration.  General appearance :adequately nourished; in no distress.  Eyes: No conjunctival inflammation or scleral icterus is present.  Oral exam:  Lips and gums are healthy appearing.There is no oropharyngeal erythema or exudate noted. Dental hygiene is good.  Heart:  S1 and S2 normal without gallop, murmur, click, rub or other extra sounds     Lungs:Chest clear to auscultation; no wheezes, rhonchi,rales ,or rubs present.No increased work of breathing.   Abdomen: bowel sounds normal, soft and non-tender without masses, organomegaly or hernias noted.  No guarding or rebound.   Vascular : all pulses equal ; no bruits present.  Skin:Warm & dry.  Intact without suspicious lesions or rashes ; no tenting or jaundice   Lymphatic: No lymphadenopathy is noted about the head, neck, axilla, or inguinal areas.   Neuro: Strength, tone & DTRs normal.        Assessment & Plan:  #1 comprehensive physical exam.  #2 Acute findings include chronic atrial fibrillation; prostatic hypertrophy:and chronic fungal toenail changes.  Plan: see Orders  & Recommendations

## 2014-12-22 NOTE — Progress Notes (Signed)
Pre visit review using our clinic review tool, if applicable. No additional management support is needed unless otherwise documented below in the visit note. 

## 2014-12-23 ENCOUNTER — Other Ambulatory Visit (INDEPENDENT_AMBULATORY_CARE_PROVIDER_SITE_OTHER): Payer: BLUE CROSS/BLUE SHIELD

## 2014-12-23 ENCOUNTER — Other Ambulatory Visit: Payer: Self-pay | Admitting: Internal Medicine

## 2014-12-23 DIAGNOSIS — Z0000 Encounter for general adult medical examination without abnormal findings: Secondary | ICD-10-CM

## 2014-12-23 LAB — CBC WITH DIFFERENTIAL/PLATELET
Basophils Absolute: 0 10*3/uL (ref 0.0–0.1)
Basophils Relative: 0.6 % (ref 0.0–3.0)
EOS ABS: 0.2 10*3/uL (ref 0.0–0.7)
EOS PCT: 3.2 % (ref 0.0–5.0)
HCT: 49.2 % (ref 39.0–52.0)
Hemoglobin: 16.5 g/dL (ref 13.0–17.0)
Lymphocytes Relative: 43.1 % (ref 12.0–46.0)
Lymphs Abs: 2.8 10*3/uL (ref 0.7–4.0)
MCHC: 33.5 g/dL (ref 30.0–36.0)
MCV: 89.6 fl (ref 78.0–100.0)
MONOS PCT: 11.5 % (ref 3.0–12.0)
Monocytes Absolute: 0.8 10*3/uL (ref 0.1–1.0)
NEUTROS PCT: 41.6 % — AB (ref 43.0–77.0)
Neutro Abs: 2.7 10*3/uL (ref 1.4–7.7)
PLATELETS: 177 10*3/uL (ref 150.0–400.0)
RBC: 5.49 Mil/uL (ref 4.22–5.81)
RDW: 13.2 % (ref 11.5–15.5)
WBC: 6.6 10*3/uL (ref 4.0–10.5)

## 2014-12-23 LAB — BASIC METABOLIC PANEL
BUN: 20 mg/dL (ref 6–23)
CO2: 27 mEq/L (ref 19–32)
Calcium: 9 mg/dL (ref 8.4–10.5)
Chloride: 106 mEq/L (ref 96–112)
Creatinine, Ser: 1.16 mg/dL (ref 0.40–1.50)
GFR: 67.47 mL/min (ref >60.00)
Glucose, Bld: 93 mg/dL (ref 70–99)
Potassium: 4.5 mEq/L (ref 3.5–5.1)
Sodium: 140 mEq/L (ref 135–145)

## 2014-12-23 LAB — TSH: TSH: 2.15 u[IU]/mL (ref 0.35–4.50)

## 2014-12-23 LAB — HEPATIC FUNCTION PANEL
ALBUMIN: 3.8 g/dL (ref 3.5–5.2)
ALK PHOS: 63 U/L (ref 39–117)
ALT: 12 U/L (ref 0–53)
AST: 28 U/L (ref 0–37)
BILIRUBIN TOTAL: 0.7 mg/dL (ref 0.2–1.2)
Bilirubin, Direct: 0.1 mg/dL (ref 0.0–0.3)
Total Protein: 6.8 g/dL (ref 6.0–8.3)

## 2014-12-23 LAB — MAGNESIUM: Magnesium: 1.9 mg/dL (ref 1.5–2.5)

## 2014-12-23 LAB — PSA: PSA: 1.52 ng/mL (ref 0.10–4.00)

## 2014-12-25 LAB — NMR LIPOPROFILE WITH LIPIDS
Cholesterol, Total: 168 mg/dL (ref 100–199)
HDL Particle Number: 24.7 umol/L — ABNORMAL LOW (ref >=30.5)
HDL SIZE: 9.1 nm — AB (ref >=9.2)
HDL-C: 43 mg/dL (ref >39)
LARGE HDL: 5.6 umol/L (ref >=4.8)
LDL (calc): 111 mg/dL — ABNORMAL HIGH (ref 0–99)
LDL PARTICLE NUMBER: 1384 nmol/L — AB (ref <1000)
LDL SIZE: 21.5 nm (ref >=20.8)
LP-IR Score: <25 (ref <=45)
Small LDL Particle Number: 458 nmol/L (ref <=527)
TRIGLYCERIDES: 72 mg/dL (ref 0–149)
VLDL SIZE: 37.2 nm (ref <=46.6)

## 2015-01-19 ENCOUNTER — Encounter: Payer: Self-pay | Admitting: Internal Medicine

## 2015-01-25 ENCOUNTER — Encounter: Payer: Self-pay | Admitting: Gastroenterology

## 2015-03-07 ENCOUNTER — Other Ambulatory Visit: Payer: Self-pay | Admitting: Emergency Medicine

## 2015-03-07 MED ORDER — LEVOTHYROXINE SODIUM 112 MCG PO TABS: 112.0000 ug | ORAL_TABLET | Freq: Every day | ORAL | Status: DC

## 2015-03-23 ENCOUNTER — Ambulatory Visit (AMBULATORY_SURGERY_CENTER): Payer: Self-pay

## 2015-03-23 VITALS — Ht 74.0 in | Wt 251.8 lb

## 2015-03-23 DIAGNOSIS — Z860100 Personal history of colon polyps, unspecified: Secondary | ICD-10-CM

## 2015-03-23 MED ORDER — SUPREP BOWEL PREP KIT 17.5-3.13-1.6 GM/177ML PO SOLN: 1.0000 | Freq: Once | ORAL | Status: DC

## 2015-03-23 NOTE — Progress Notes (Signed)
No allergies to eggs or soy No diet/weight loss meds No home oxygen No past problems with anesthesia  Refused emmi; has had procedure before. 

## 2015-04-06 ENCOUNTER — Ambulatory Visit (AMBULATORY_SURGERY_CENTER): Payer: BLUE CROSS/BLUE SHIELD | Admitting: Gastroenterology

## 2015-04-06 ENCOUNTER — Telehealth: Payer: Self-pay | Admitting: Internal Medicine

## 2015-04-06 ENCOUNTER — Encounter: Payer: Self-pay | Admitting: Gastroenterology

## 2015-04-06 VITALS — BP 117/83 | HR 69 | Temp 95.9°F | Resp 19 | Ht 74.0 in | Wt 251.0 lb

## 2015-04-06 DIAGNOSIS — Z86010 Personal history of colonic polyps: Secondary | ICD-10-CM

## 2015-04-06 DIAGNOSIS — K635 Polyp of colon: Secondary | ICD-10-CM

## 2015-04-06 DIAGNOSIS — D124 Benign neoplasm of descending colon: Secondary | ICD-10-CM

## 2015-04-06 DIAGNOSIS — K621 Rectal polyp: Secondary | ICD-10-CM

## 2015-04-06 DIAGNOSIS — D128 Benign neoplasm of rectum: Secondary | ICD-10-CM

## 2015-04-06 DIAGNOSIS — D125 Benign neoplasm of sigmoid colon: Secondary | ICD-10-CM

## 2015-04-06 DIAGNOSIS — D120 Benign neoplasm of cecum: Secondary | ICD-10-CM | POA: Diagnosis not present

## 2015-04-06 MED ORDER — SODIUM CHLORIDE 0.9 % IV SOLN: 500.0000 mL | INTRAVENOUS | Status: DC

## 2015-04-06 NOTE — Telephone Encounter (Signed)
Spoke with patient and his wife. Patient had an endoscopic procedure this morning.  They informed him he was in AFib and he should let his cardiologist know. Patient denies symptoms. Informed patient that this is a chronic condition, for him, and that without symptoms/issues we would not do anything further at this time.  I inquired what HR was, but he did not know.  I searched and only vital sign I see is HR of 69. Patient will call office if symptoms arise.

## 2015-04-06 NOTE — Op Note (Signed)
[place]  [address] [place], [id number]   COLONOSCOPY PROCEDURE REPORT  PATIENT: Elijah, [name]  MR#: [id number] BIRTHDATE: 1950-09-21 , 53  yrs. old GENDER: male ENDOSCOPIST: Yetta Flock, MD REFERRED BY: Unice Cobble MD PROCEDURE DATE:  04/06/2015 PROCEDURE:   Colonoscopy, surveillance , Colonoscopy with snare polypectomy, and Colonoscopy with biopsy First Screening Colonoscopy - Avg.  risk and is 50 yrs.  old or older - No.  Prior Negative Screening - Now for repeat screening. N/A  History of Adenoma - Now for follow-up colonoscopy & has been > or = to 3 yrs.  Yes hx of adenoma.  Has been 3 or more years since last colonoscopy.  Polyps removed today? Yes ASA CLASS:   Class II INDICATIONS:Surveillance due to prior colonic neoplasia and Colorectal Neoplasm Risk Assessment for this procedure is average risk. MEDICATIONS: Propofol 325 mg IV  DESCRIPTION OF PROCEDURE:   After the risks benefits and alternatives of the procedure were thoroughly explained, informed consent was obtained.  The digital rectal exam revealed no abnormalities of the rectum.   The LB [id number]  endoscope was introduced through the anus and advanced to the cecum, which was identified by both the appendix and ileocecal valve. No adverse events experienced.   The quality of the prep was good.  The instrument was then slowly withdrawn as the colon was fully examined. Estimated blood loss is zero unless otherwise noted in this procedure report.  COLON FINDINGS: Two roughly 3-16mm sessile polyps were noted in the cecum and removed with cold forceps.  A 3-16mm sessile polyp was noted on the IC valve and removed with cold forceps.  A 3-64mm sessile polyp was noted in the ascending colon and removed with cold forceps.  A small non-bleeding AVM was noted in the ascending colon.  A 5-53mm sessile polyp was noted in the sigmoid colon and removed with cold snare.  A 3-4 mm flat  polyp was noted in the rectum and removed with cold forceps.  There was moderate diverticulosis in the left colon.  The remainder of the colon was normal.  Retroflexed views revealed no abnormalities. The time to cecum = 5.7 Withdrawal time = 19.3   The scope was withdrawn and the procedure completed. COMPLICATIONS: There were no immediate complications.  ENDOSCOPIC IMPRESSION: Multiple small colon polyps as outlined above removed via either cold forceps or cold snare Moderate left sided diverticulosis  RECOMMENDATIONS: Resume medications Resume diet No NSAIDs other than aspirin for one week Await pathology results  eSigned:  Yetta Flock, MD 04/06/2015 10:04 AM   cc: Unice Cobble MD, the patient   PATIENT NAME:  Elijah, [name] MR#: [id number]

## 2015-04-06 NOTE — Telephone Encounter (Signed)
New Message    Pt wife wants Rn to call her regarding pt

## 2015-04-06 NOTE — Progress Notes (Signed)
Called to room to assist during endoscopic procedure.  Patient ID and intended procedure confirmed with present staff. Received instructions for my participation in the procedure from the performing physician.  

## 2015-04-06 NOTE — Patient Instructions (Signed)
YOU HAD AN ENDOSCOPIC PROCEDURE TODAY AT [facility]:   Refer to the procedure report that was given to you for any specific questions about what was found during the examination.  If the procedure report does not answer your questions, please call your gastroenterologist to clarify.  If you requested that your care partner not be given the details of your procedure findings, then the procedure report has been included in a sealed envelope for you to review at your convenience later.  YOU SHOULD EXPECT: Some feelings of bloating in the abdomen. Passage of more gas than usual.  Walking can help get rid of the air that was put into your GI tract during the procedure and reduce the bloating. If you had a lower endoscopy (such as a colonoscopy or flexible sigmoidoscopy) you may notice spotting of blood in your stool or on the toilet paper. If you underwent a bowel prep for your procedure, you may not have a normal bowel movement for a few days.  Please Note:  You might notice some irritation and congestion in your nose or some drainage.  This is from the oxygen used during your procedure.  There is no need for concern and it should clear up in a day or so.  SYMPTOMS TO REPORT IMMEDIATELY:   Following lower endoscopy (colonoscopy or flexible sigmoidoscopy):  Excessive amounts of blood in the stool  Significant tenderness or worsening of abdominal pains  Swelling of the abdomen that is new, acute  Fever of 100F or higher    For urgent or emergent issues, a gastroenterologist can be reached at any hour by calling [phone].   DIET: Your first meal following the procedure should be a small meal and then it is ok to progress to your normal diet. Heavy or fried foods are harder to digest and may make you feel nauseous or bloated.  Likewise, meals heavy in dairy and vegetables can increase bloating.  Drink plenty of fluids but you should avoid alcoholic beverages for 24  hours.  ACTIVITY:  You should plan to take it easy for the rest of today and you should NOT DRIVE or use heavy machinery until tomorrow (because of the sedation medicines used during the test).    FOLLOW UP: Our staff will call the number listed on your records the next business day following your procedure to check on you and address any questions or concerns that you may have regarding the information given to you following your procedure. If we do not reach you, we will leave a message.  However, if you are feeling well and you are not experiencing any problems, there is no need to return our call.  We will assume that you have returned to your regular daily activities without incident.  If any biopsies were taken you will be contacted by phone or by letter within the next 1-3 weeks.  Please call us at [phone] if you have not heard about the biopsies in 3 weeks.    SIGNATURES/CONFIDENTIALITY: You and/or your care partner have signed paperwork which will be entered into your electronic medical record.  These signatures attest to the fact that that the information above on your After Visit Summary has been reviewed and is understood.  Full responsibility of the confidentiality of this discharge information lies with you and/or your care-partner.   INFORMATION ON POLYPS & DIVERTICULOSIS  GIVEN TO YOU TODAY  NO ASPIRIN OR ANTI-INFLAMMATORY PRODUCTS FOR ONE WEEK

## 2015-04-06 NOTE — Progress Notes (Signed)
Transferred to recovery room. A/O x3, pleased with MAC.  VSS.  Report to Penny, RN. 

## 2015-04-07 ENCOUNTER — Telehealth: Payer: Self-pay | Admitting: *Deleted

## 2015-04-07 NOTE — Telephone Encounter (Signed)
  Follow up Call-  Call back number 04/06/2015  Post procedure Call Back phone  # [phone]  Permission to leave phone message Yes     Patient questions:  Do you have a fever, pain , or abdominal swelling? No. Pain Score  0 *  Have you tolerated food without any problems? Yes.    Have you been able to return to your normal activities? Yes.    Do you have any questions about your discharge instructions: Diet   No. Medications  No. Follow up visit  No.  Do you have questions or concerns about your Care? No.  Actions: * If pain score is 4 or above: No action needed, pain <4.

## 2015-04-12 ENCOUNTER — Encounter: Payer: Self-pay | Admitting: Gastroenterology

## 2015-05-30 ENCOUNTER — Other Ambulatory Visit: Payer: Self-pay | Admitting: Internal Medicine

## 2015-09-27 ENCOUNTER — Ambulatory Visit (INDEPENDENT_AMBULATORY_CARE_PROVIDER_SITE_OTHER): Payer: BLUE CROSS/BLUE SHIELD | Admitting: Physician Assistant

## 2015-09-27 VITALS — BP 144/85 | HR 73 | Temp 98.1°F | Resp 17 | Ht 74.0 in | Wt 257.0 lb

## 2015-09-27 DIAGNOSIS — J069 Acute upper respiratory infection, unspecified: Secondary | ICD-10-CM | POA: Diagnosis not present

## 2015-09-27 DIAGNOSIS — H6122 Impacted cerumen, left ear: Secondary | ICD-10-CM

## 2015-09-27 DIAGNOSIS — B9789 Other viral agents as the cause of diseases classified elsewhere: Principal | ICD-10-CM

## 2015-09-27 MED ORDER — HYDROCODONE-HOMATROPINE 5-1.5 MG/5ML PO SYRP: 2.5000 mL | ORAL_SOLUTION | Freq: Every evening | ORAL | Status: DC | PRN

## 2015-09-27 MED ORDER — BENZONATATE 200 MG PO CAPS: 200.0000 mg | ORAL_CAPSULE | Freq: Two times a day (BID) | ORAL | Status: DC | PRN

## 2015-09-27 NOTE — Patient Instructions (Addendum)
You have the common cold. Unfortunately there is no cure and antibiotics can be risky when there is no bacterial infection.  Please take Tylenol 1000 mg every eight hours for fatigue and low grade fever.  Please contact me or come back to the clinic if you have fever greater than 101, develop shortness of breath or new shortness of breath on exertion.  Please continue exercising, however consider a lower intensity.  You can expect your symptoms to completely resolve in 10-14 days.     IF you received an x-ray today, you will receive an invoice from [facility]. Please contact [facility] at [phone] with questions or concerns regarding your invoice.   IF you received labwork today, you will receive an invoice from [employer]. Please contact [facility] at [phone] with questions or concerns regarding your invoice.   Our billing staff will not be able to assist you with questions regarding bills from these companies.  You will be contacted with the lab results as soon as they are available. The fastest way to get your results is to activate your My Chart account. Instructions are located on the last page of this paperwork. If you have not heard from [place] regarding the results in 2 weeks, please contact this office.

## 2015-09-27 NOTE — Progress Notes (Signed)
09/27/2015 9:04 AM   DOB: [birth year] / MRN: [id number]  SUBJECTIVE:  Elijah [name] is a 64 y.o. male presenting for the evaluation of cough. Reports this started 4 days ago and was preceded by a sore throat which has resolved.  Complains of nasal congestion.  Denies chest pain, SOB, nausea, leg swelling.  Has history of afib and has not missed any of his prescription medications.  Has not tried anything for his symptoms.  Feels that he is overall getting better aside from cough.    He has No Known Allergies.   He  has a past medical history of Hyperlipidemia; Thyroid disease; Diverticulosis (2007); Adenomatous colon polyp; Atrial fibrillation ([place]) (2012); and Atrial fibrillation ([place]) (11/22-23/2013).    He  reports that he has never smoked. He has never used smokeless tobacco. He reports that he does not drink alcohol or use illicit drugs. He  reports that he does not engage in sexual activity. The patient  has past surgical history that includes Tonsillectomy; Chest tube insertion (1982); Colonoscopy w/ polypectomy (2007); transthoracic echocardiogram ( 12/2010, ); and Holter monitor-48 hour (03/2011, 12/2010).  His family history includes Breast cancer in his mother; Cerebral aneurysm (age of onset: 36) in his sister; Hypertension in his father, mother, and sister; Stroke (age of onset: 13) in his mother; Valvular heart disease in his mother. There is no history of Diabetes or Colon cancer.  Review of Systems  Constitutional: Negative for chills.  HENT: Negative for hearing loss.   Respiratory: Negative for hemoptysis and wheezing.   Cardiovascular: Negative for orthopnea.  Gastrointestinal: Negative for nausea.  Skin: Negative for rash.  Neurological: Negative for dizziness.    Problem list and medications reviewed and updated by myself where necessary, and exist elsewhere in the encounter.   OBJECTIVE:  BP 144/85 mmHg  Pulse 73  Temp(Src) 98.1 F (36.7 C) (Oral)  Resp 17   Ht 6\' 2"  (1.88 m)  Wt 257 lb (116.574 kg)  BMI 32.98 kg/m2  SpO2 98%  Physical Exam  Constitutional: He is oriented to person, place, and time. He appears well-developed. He does not appear ill.  HENT:  Ears:  Eyes: Conjunctivae and EOM are normal. Pupils are equal, round, and reactive to light.  Cardiovascular: Normal rate.   BP 124/80 on recheck.  Heart rate irregularly irregular.   Pulmonary/Chest: Effort normal and breath sounds normal.  Abdominal: He exhibits no distension.  Musculoskeletal: Normal range of motion.  Neurological: He is alert and oriented to person, place, and time. No cranial nerve deficit. Coordination normal.  Skin: Skin is warm and dry. He is not diaphoretic.  Psychiatric: He has a normal mood and affect.  Nursing note and vitals reviewed.   No results found for this or any previous visit (from the past 72 hour(s)).  No results found.  ASSESSMENT AND PLAN  Jaelynn was seen today for cough and uri.  Diagnoses and all orders for this visit:  Viral URI with cough: Treatment plan per AVS with RTC precautions.  He should do well.  -     HYDROcodone-homatropine (HYCODAN) 5-1.5 MG/5ML syrup; Take 2.5-5 mLs by mouth at bedtime as needed. -     benzonatate (TESSALON) 200 MG capsule; Take 1 capsule (200 mg total) by mouth 2 (two) times daily as needed for cough.  Cerumen impaction, left: Asymptomatic. Disimpacted via lavage in clinic with good results.      The patient was advised to call or return to clinic if  he does not see an improvement in symptoms or to seek the care of the closest emergency department if he worsens with the above plan.   Philis Fendt, MHS, PA-C [facility] 09/27/2015 9:04 AM

## 2015-10-06 ENCOUNTER — Ambulatory Visit (INDEPENDENT_AMBULATORY_CARE_PROVIDER_SITE_OTHER): Payer: BLUE CROSS/BLUE SHIELD | Admitting: Internal Medicine

## 2015-10-06 VITALS — BP 132/78 | HR 68 | Temp 98.0°F | Resp 18 | Ht 74.0 in | Wt 238.0 lb

## 2015-10-06 DIAGNOSIS — R059 Cough, unspecified: Secondary | ICD-10-CM

## 2015-10-06 DIAGNOSIS — R05 Cough: Secondary | ICD-10-CM

## 2015-10-06 DIAGNOSIS — J0100 Acute maxillary sinusitis, unspecified: Secondary | ICD-10-CM

## 2015-10-06 DIAGNOSIS — J4520 Mild intermittent asthma, uncomplicated: Secondary | ICD-10-CM | POA: Diagnosis not present

## 2015-10-06 MED ORDER — AMOXICILLIN 875 MG PO TABS: 875.0000 mg | ORAL_TABLET | Freq: Two times a day (BID) | ORAL | Status: DC

## 2015-10-06 MED ORDER — PREDNISONE 20 MG PO TABS
ORAL_TABLET | ORAL | Status: DC
Start: 2015-10-06 — End: 2015-12-24

## 2015-10-06 NOTE — Patient Instructions (Signed)
     IF you received an x-ray today, you will receive an invoice from [facility]. Please contact [facility] at [phone] with questions or concerns regarding your invoice.   IF you received labwork today, you will receive an invoice from [facility]. Please contact [facility] at [phone] with questions or concerns regarding your invoice.   Our billing staff will not be able to assist you with questions regarding bills from these companies.  You will be contacted with the lab results as soon as they are available. The fastest way to get your results is to activate your My Chart account. Instructions are located on the last page of this paperwork. If you have not heard from us regarding the results in 2 weeks, please contact this office.      

## 2015-10-06 NOTE — Progress Notes (Signed)
Subjective:  By signing my name below, I, Raven Small, attest that this documentation has been prepared under the direction and in the presence of Tami Lin, MD.  Electronically Signed: Thea Alken, ED Scribe. 10/06/2015. 9:13 AM.    Patient ID: Elijah [name], male    DOB: [birth year], 65 y.o.   MRN: [id number]  HPI Chief Complaint  Patient presents with  . Follow-up    URI    HPI Comments: Elijah [name] is a 65 y.o. male who presents to the [facility] for a follow up. Pt states symptoms started with sore throat 11 days ago. The following day symptoms worsened with nasal congestion, cough and trouble sleeping. He was seen here 9 days ago with cough, congestion and improved sore throat. He was treated with Hycodan with initial improvement but symptoms worsened 5 days ago. He reports cough keeps him awake at night and has worsening cough in the morning producing thick mucous. Pt has still been working out and denies SOB with exertion. Pt denies, fever, chills and otalgia  Patient Active Problem List   Diagnosis Date Noted  . Adenomatous polyp of colon 11/22/2013  . Atrial fibrillation ([place]) 12/31/2010  . Hyperlipidemia 04/26/2008  . Hypothyroidism 02/19/2007   Past Medical History  Diagnosis Date  . Hyperlipidemia   . Thyroid disease     hypothyroidism  . Diverticulosis 2007  . Adenomatous colon polyp   . Atrial fibrillation [facility]) 2012    Dr Cristopher Peru seen as OP  . Atrial fibrillation ([place]) 11/22-23/2013    presentation as nausea, dizziness & chest tightness   Past Surgical History  Procedure Laterality Date  . Tonsillectomy    . Chest tube insertion  1982    Spontaneous Pneumothorax  . Colonoscopy w/ polypectomy  2007    Adenomatous Polyp; [facility], Dr Olevia Perches  . Transthoracic echocardiogram   12/2010,   . Holter monitor-48 hour  03/2011, 12/2010    Dr Crissie Sickles   No Known Allergies Prior to Admission medications   Medication Sig  Start Date End Date Taking? Authorizing Provider  Omega-3 Fatty Acids (FISH OIL PO) Take 2 capsules by mouth daily.    Yes Historical Provider, MD  SYNTHROID 112 MCG tablet TAKE 1 TABLET BY MOUTH EVERY DAY. EXCEPT ON SUNDAYS, TAKE 1.5 TABLETS 05/30/15  Yes Hendricks Limes, MD  aspirin 81 MG EC tablet Take 162 mg by mouth daily. Reported on 10/06/2015    Historical Provider, MD  benzonatate (TESSALON) 200 MG capsule Take 1 capsule (200 mg total) by mouth 2 (two) times daily as needed for cough. Patient not taking: Reported on 10/06/2015 09/27/15   Tereasa Coop, PA-C  HYDROcodone-homatropine [facility]) 5-1.5 MG/5ML syrup Take 2.5-5 mLs by mouth at bedtime as needed. Patient not taking: Reported on 10/06/2015 09/27/15   Tereasa Coop, PA-C   Social History   Social History  . Marital Status: Married    Spouse Name: N/A  . Number of Children: N/A  . Years of Education: N/A   Occupational History  . Not on file.   Social History Main Topics  . Smoking status: Never Smoker   . Smokeless tobacco: Never Used  . Alcohol Use: No  . Drug Use: No  . Sexual Activity: No   Other Topics Concern  . Not on file   Social History Narrative   Review of Systems  Constitutional: Negative for fever and chills.  HENT: Positive for congestion. Negative for  ear pain and sore throat.   Respiratory: Positive for cough. Negative for shortness of breath.    Objective:   Physical Exam  Constitutional: He is oriented to person, place, and time. He appears well-developed and well-nourished. No distress.  HENT:  Head: Normocephalic and atraumatic.  Right Ear: External ear normal.  Left Ear: External ear normal.  Mouth/Throat: Oropharynx is clear and moist. No oropharyngeal exudate.  Boggy turbinates  Eyes: Conjunctivae and EOM are normal.  Neck: Neck supple.  Cardiovascular: Normal rate.   Pulmonary/Chest: Effort normal. He has wheezes ( forced expiration bilaterally).  Musculoskeletal: Normal range  of motion.  Lymphadenopathy:    He has no cervical adenopathy.  Neurological: He is alert and oriented to person, place, and time.  Skin: Skin is warm and dry.  Psychiatric: He has a normal mood and affect. His behavior is normal.  Nursing note and vitals reviewed.  Filed Vitals:   10/06/15 0849  BP: 132/78  Pulse: 68  Temp: 98 F (36.7 C)  TempSrc: Oral  Resp: 18  Height: 6\' 2"  (1.88 m)  Weight: 238 lb (107.956 kg)  SpO2: 98%    Assessment & Plan:   1. RAD (reactive airway disease), mild intermittent, uncomplicated   2. Cough   3. Acute maxillary sinusitis, recurrence not specified     Meds ordered this encounter  Medications  . amoxicillin (AMOXIL) 875 MG tablet    Sig: Take 1 tablet (875 mg total) by mouth 2 (two) times daily.    Dispense:  20 tablet    Refill:  0  . predniSONE (DELTASONE) 20 MG tablet    Sig: 3/3/2/2/1/1 single daily dose for 6 days    Dispense:  12 tablet    Refill:  0    I have completed the patient encounter in its entirety as documented by the scribe, with editing by me where necessary. Aisia Correira P. Laney Pastor, M.D.

## 2015-11-07 ENCOUNTER — Other Ambulatory Visit: Payer: Self-pay | Admitting: Emergency Medicine

## 2015-11-07 MED ORDER — SYNTHROID 112 MCG PO TABS: ORAL_TABLET | ORAL | Status: DC

## 2015-12-24 NOTE — Progress Notes (Signed)
Subjective:    Patient ID: Elijah [name], male    DOB: 03-03-1951, 65 y.o.   MRN: [id number]  HPI He is here for a physical exam.   He denies any changes in his health or family history since he was here last.  He has no questions or concerns.   Medications and allergies reviewed with patient and updated if appropriate.  Patient Active Problem List   Diagnosis Date Noted  . Adenomatous polyp of colon 11/22/2013  . Atrial fibrillation ([place]) 12/31/2010  . Hyperlipidemia 04/26/2008  . Hypothyroidism 02/19/2007    Current Outpatient Prescriptions on File Prior to Visit  Medication Sig Dispense Refill  . aspirin 81 MG EC tablet Take 162 mg by mouth daily. Reported on 10/06/2015    . Omega-3 Fatty Acids (FISH OIL PO) Take 2 capsules by mouth daily.     Marland Kitchen SYNTHROID 112 MCG tablet TAKE 1 TABLET BY MOUTH EVERY DAY. EXCEPT ON SUNDAYS, TAKE 1.5 TABLETS 102 tablet 0   No current facility-administered medications on file prior to visit.    Past Medical History  Diagnosis Date  . Hyperlipidemia   . Thyroid disease     hypothyroidism  . Diverticulosis 2007  . Adenomatous colon polyp   . Atrial fibrillation [facility]) 2012    Dr Cristopher Peru seen as OP  . Atrial fibrillation ([place]) 11/22-23/2013    presentation as nausea, dizziness & chest tightness    Past Surgical History  Procedure Laterality Date  . Tonsillectomy    . Chest tube insertion  1982    Spontaneous Pneumothorax  . Colonoscopy w/ polypectomy  2007    Adenomatous Polyp; [facility], Dr Olevia Perches  . Transthoracic echocardiogram   12/2010,   . Holter monitor-48 hour  03/2011, 12/2010    Dr Crissie Sickles    Social History   Social History  . Marital Status: Married    Spouse Name: N/A  . Number of Children: N/A  . Years of Education: N/A   Social History Main Topics  . Smoking status: Never Smoker   . Smokeless tobacco: Never Used  . Alcohol Use: No  . Drug Use: No  . Sexual Activity: No   Other Topics Concern   . Not on file   Social History Narrative    Family History  Problem Relation Age of Onset  . Stroke Mother 65    cns aneurysm bleed X2  . Breast cancer Mother   . Valvular heart disease Mother     valve replacement  . Hypertension Mother   . Cerebral aneurysm Sister 61  . Hypertension Sister   . Diabetes Neg Hx   . Colon cancer Neg Hx   . Hypertension Father     Review of Systems  Constitutional: Negative for fever, chills, appetite change, fatigue and unexpected weight change.  HENT: Negative for hearing loss.   Eyes: Negative for visual disturbance.  Respiratory: Negative for cough, shortness of breath and wheezing.   Cardiovascular: Negative for chest pain and palpitations.  Gastrointestinal: Negative for nausea, abdominal pain, diarrhea, constipation and blood in stool.       No gerd  Genitourinary: Negative for dysuria, hematuria and difficulty urinating.  Musculoskeletal: Positive for arthralgias (mild, knees). Negative for back pain.  Skin: Negative for color change and rash.  Neurological: Positive for light-headedness (rare, with position changes). Negative for dizziness and headaches.  Psychiatric/Behavioral: Negative for sleep disturbance and dysphoric mood. The patient is not nervous/anxious.  Objective:   Filed Vitals:   12/25/15 0755  BP: 120/76  Pulse: 66  Temp: 97.7 F (36.5 C)  Resp: 16   Filed Weights   12/25/15 0755  Weight: 259 lb (117.482 kg)   Body mass index is 33.24 kg/(m^2).   Physical Exam Constitutional: He appears well-developed and well-nourished. No distress.  HENT:  Head: Normocephalic and atraumatic.  Right Ear: External ear normal.  Left Ear: External ear normal.  Mouth/Throat: Oropharynx is clear and moist.  Normal ear canals and TM b/l  Eyes: Conjunctivae and EOM are normal.  Neck: Neck supple. No tracheal deviation present. No thyromegaly present.  No carotid bruit  Cardiovascular: Normal rate, irregular  rhythm, normal heart sounds and intact distal pulses.   No murmur heard. Pulmonary/Chest: Effort normal and breath sounds normal. No respiratory distress. He has no wheezes. He has no rales.  Abdominal: Soft. Bowel sounds are normal. He exhibits no distension. There is no tenderness.  Genitourinary: deferred, will get psa  Musculoskeletal: He exhibits no edema.  Lymphadenopathy:    He has no cervical adenopathy.  Skin: Skin is warm and dry. He is not diaphoretic.  Psychiatric: He has a normal mood and affect. His behavior is normal.         Assessment & Plan:   Physical exam: Screening blood work Immunizations   Tetanus due Colonoscopy   Up to date  Eye exams  Up to date  EKG - done 12/22/14 - showed Afib, will recheck today Exercise - regular, doing both cardio and weights Weight - overweight - he knows he should lose weight, encouraged weight loss Skin  - no concerns, avoids the sun or uses sunscreen Substance abuse -  none   See Problem List for Assessment and Plan of chronic medical problems.

## 2015-12-24 NOTE — Patient Instructions (Addendum)
Test(s) ordered today. Your results will be released to [place] (or called to you) after review, usually within 72hours after test completion. If any changes need to be made, you will be notified at that same time.  All other Health Maintenance issues reviewed.   All recommended immunizations and age-appropriate screenings are up-to-date or discussed.  Tetanus vaccine administered today.   An EKG was done today.   Medications reviewed and updated.  No changes recommended at this time.  Your prescription(s) have been submitted to your pharmacy. Please take as directed and contact our office if you believe you are having problem(s) with the medication(s).   Please followup in one year for a physical exam  Health Maintenance, Male A healthy lifestyle and preventative care can promote health and wellness.  Maintain regular health, dental, and eye exams.  Eat a healthy diet. Foods like vegetables, fruits, whole grains, low-fat dairy products, and lean protein foods contain the nutrients you need and are low in calories. Decrease your intake of foods high in solid fats, added sugars, and salt. Get information about a proper diet from your health care provider, if necessary.  Regular physical exercise is one of the most important things you can do for your health. Most adults should get at least 150 minutes of moderate-intensity exercise (any activity that increases your heart rate and causes you to sweat) each week. In addition, most adults need muscle-strengthening exercises on 2 or more days a week.   Maintain a healthy weight. The body mass index (BMI) is a screening tool to identify possible weight problems. It provides an estimate of body fat based on height and weight. Your health care provider can find your BMI and can help you achieve or maintain a healthy weight. For males 20 years and older:  A BMI below 18.5 is considered underweight.  A BMI of 18.5 to 24.9 is normal.  A BMI of  25 to 29.9 is considered overweight.  A BMI of 30 and above is considered obese.  Maintain normal blood lipids and cholesterol by exercising and minimizing your intake of saturated fat. Eat a balanced diet with plenty of fruits and vegetables. Blood tests for lipids and cholesterol should begin at age 46 and be repeated every 5 years. If your lipid or cholesterol levels are high, you are over age 74, or you are at high risk for heart disease, you may need your cholesterol levels checked more frequently.Ongoing high lipid and cholesterol levels should be treated with medicines if diet and exercise are not working.  If you smoke, find out from your health care provider how to quit. If you do not use tobacco, do not start.  Lung cancer screening is recommended for adults aged 44-80 years who are at high risk for developing lung cancer because of a history of smoking. A yearly low-dose CT scan of the lungs is recommended for people who have at least a 30-pack-year history of smoking and are current smokers or have quit within the past 15 years. A pack year of smoking is smoking an average of 1 pack of cigarettes a day for 1 year (for example, a 30-pack-year history of smoking could mean smoking 1 pack a day for 30 years or 2 packs a day for 15 years). Yearly screening should continue until the smoker has stopped smoking for at least 15 years. Yearly screening should be stopped for people who develop a health problem that would prevent them from having lung cancer treatment.  If you choose to drink alcohol, do not have more than 2 drinks per day. One drink is considered to be 12 oz (360 mL) of beer, 5 oz (150 mL) of wine, or 1.5 oz (45 mL) of liquor.  Avoid the use of street drugs. Do not share needles with anyone. Ask for help if you need support or instructions about stopping the use of drugs.  High blood pressure causes heart disease and increases the risk of stroke. High blood pressure is more likely  to develop in:  People who have blood pressure in the end of the normal range (100-139/85-89 mm Hg).  People who are overweight or obese.  People who are African American.  If you are 68-68 years of age, have your blood pressure checked every 3-5 years. If you are 42 years of age or older, have your blood pressure checked every year. You should have your blood pressure measured twice--once when you are at a hospital or clinic, and once when you are not at a hospital or clinic. Record the average of the two measurements. To check your blood pressure when you are not at a hospital or clinic, you can use:  An automated blood pressure machine at a pharmacy.  A home blood pressure monitor.  If you are 74-8 years old, ask your health care provider if you should take aspirin to prevent heart disease.  Diabetes screening involves taking a blood sample to check your fasting blood sugar level. This should be done once every 3 years after age 32 if you are at a normal weight and without risk factors for diabetes. Testing should be considered at a younger age or be carried out more frequently if you are overweight and have at least 1 risk factor for diabetes.  Colorectal cancer can be detected and often prevented. Most routine colorectal cancer screening begins at the age of 29 and continues through age 21. However, your health care provider may recommend screening at an earlier age if you have risk factors for colon cancer. On a yearly basis, your health care provider may provide home test kits to check for hidden blood in the stool. A small camera at the end of a tube may be used to directly examine the colon (sigmoidoscopy or colonoscopy) to detect the earliest forms of colorectal cancer. Talk to your health care provider about this at age 42 when routine screening begins. A direct exam of the colon should be repeated every 5-10 years through age 31, unless early forms of precancerous polyps or small  growths are found.  People who are at an increased risk for hepatitis B should be screened for this virus. You are considered at high risk for hepatitis B if:  You were born in a country where hepatitis B occurs often. Talk with your health care provider about which countries are considered high risk.  Your parents were born in a high-risk country and you have not received a shot to protect against hepatitis B (hepatitis B vaccine).  You have HIV or AIDS.  You use needles to inject street drugs.  You live with, or have sex with, someone who has hepatitis B.  You are a man who has sex with other men (MSM).  You get hemodialysis treatment.  You take certain medicines for conditions like cancer, organ transplantation, and autoimmune conditions.  Hepatitis C blood testing is recommended for all people born from [birth year] through 1965 and any individual with known risk factors for hepatitis C.  Healthy men should no longer receive prostate-specific antigen (PSA) blood tests as part of routine cancer screening. Talk to your health care provider about prostate cancer screening.  Testicular cancer screening is not recommended for adolescents or adult males who have no symptoms. Screening includes self-exam, a health care provider exam, and other screening tests. Consult with your health care provider about any symptoms you have or any concerns you have about testicular cancer.  Practice safe sex. Use condoms and avoid high-risk sexual practices to reduce the spread of sexually transmitted infections (STIs).  You should be screened for STIs, including gonorrhea and chlamydia if:  You are sexually active and are younger than 24 years.  You are older than 24 years, and your health care provider tells you that you are at risk for this type of infection.  Your sexual activity has changed since you were last screened, and you are at an increased risk for chlamydia or gonorrhea. Ask your health care  provider if you are at risk.  If you are at risk of being infected with HIV, it is recommended that you take a prescription medicine daily to prevent HIV infection. This is called pre-exposure prophylaxis (PrEP). You are considered at risk if:  You are a man who has sex with other men (MSM).  You are a heterosexual man who is sexually active with multiple partners.  You take drugs by injection.  You are sexually active with a partner who has HIV.  Talk with your health care provider about whether you are at high risk of being infected with HIV. If you choose to begin PrEP, you should first be tested for HIV. You should then be tested every 3 months for as long as you are taking PrEP.  Use sunscreen. Apply sunscreen liberally and repeatedly throughout the day. You should seek shade when your shadow is shorter than you. Protect yourself by wearing long sleeves, pants, a wide-brimmed hat, and sunglasses year round whenever you are outdoors.  Tell your health care provider of new moles or changes in moles, especially if there is a change in shape or color. Also, tell your health care provider if a mole is larger than the size of a pencil eraser.  A one-time screening for abdominal aortic aneurysm (AAA) and surgical repair of large AAAs by ultrasound is recommended for men aged 81-75 years who are current or former smokers.  Stay current with your vaccines (immunizations).   This information is not intended to replace advice given to you by your health care provider. Make sure you discuss any questions you have with your health care provider.   Document Released: 11/30/2007 Document Revised: 06/24/2014 Document Reviewed: 10/29/2010 Elsevier Interactive Patient Education [employer].

## 2015-12-25 ENCOUNTER — Ambulatory Visit (INDEPENDENT_AMBULATORY_CARE_PROVIDER_SITE_OTHER): Payer: BLUE CROSS/BLUE SHIELD | Admitting: Internal Medicine

## 2015-12-25 ENCOUNTER — Encounter: Payer: Self-pay | Admitting: Internal Medicine

## 2015-12-25 ENCOUNTER — Other Ambulatory Visit (INDEPENDENT_AMBULATORY_CARE_PROVIDER_SITE_OTHER): Payer: BLUE CROSS/BLUE SHIELD

## 2015-12-25 VITALS — BP 120/76 | HR 66 | Temp 97.7°F | Resp 16 | Ht 74.0 in | Wt 259.0 lb

## 2015-12-25 DIAGNOSIS — Z23 Encounter for immunization: Secondary | ICD-10-CM

## 2015-12-25 DIAGNOSIS — I482 Chronic atrial fibrillation, unspecified: Secondary | ICD-10-CM

## 2015-12-25 DIAGNOSIS — E039 Hypothyroidism, unspecified: Secondary | ICD-10-CM | POA: Diagnosis not present

## 2015-12-25 DIAGNOSIS — Z0000 Encounter for general adult medical examination without abnormal findings: Secondary | ICD-10-CM

## 2015-12-25 LAB — CBC WITH DIFFERENTIAL/PLATELET
BASOS PCT: 0.7 % (ref 0.0–3.0)
Basophils Absolute: 0.1 10*3/uL (ref 0.0–0.1)
EOS PCT: 2.3 % (ref 0.0–5.0)
Eosinophils Absolute: 0.2 10*3/uL (ref 0.0–0.7)
HEMATOCRIT: 47.8 % (ref 39.0–52.0)
Hemoglobin: 16.4 g/dL (ref 13.0–17.0)
LYMPHS PCT: 44.3 % (ref 12.0–46.0)
Lymphs Abs: 3.4 10*3/uL (ref 0.7–4.0)
MCHC: 34.2 g/dL (ref 30.0–36.0)
MCV: 87.7 fl (ref 78.0–100.0)
MONOS PCT: 11.7 % (ref 3.0–12.0)
Monocytes Absolute: 0.9 10*3/uL (ref 0.1–1.0)
NEUTROS ABS: 3.1 10*3/uL (ref 1.4–7.7)
Neutrophils Relative %: 41 % — ABNORMAL LOW (ref 43.0–77.0)
PLATELETS: 193 10*3/uL (ref 150.0–400.0)
RBC: 5.45 Mil/uL (ref 4.22–5.81)
RDW: 13.4 % (ref 11.5–15.5)
WBC: 7.6 10*3/uL (ref 4.0–10.5)

## 2015-12-25 LAB — COMPREHENSIVE METABOLIC PANEL
ALT: 13 U/L (ref 0–53)
AST: 35 U/L (ref 0–37)
Albumin: 4.1 g/dL (ref 3.5–5.2)
Alkaline Phosphatase: 65 U/L (ref 39–117)
BILIRUBIN TOTAL: 0.8 mg/dL (ref 0.2–1.2)
BUN: 19 mg/dL (ref 6–23)
CALCIUM: 9.4 mg/dL (ref 8.4–10.5)
CHLORIDE: 104 meq/L (ref 96–112)
CO2: 28 meq/L (ref 19–32)
Creatinine, Ser: 1.1 mg/dL (ref 0.40–1.50)
GFR: 71.51 mL/min (ref >60.00)
Glucose, Bld: 96 mg/dL (ref 70–99)
Potassium: 4.5 mEq/L (ref 3.5–5.1)
Sodium: 138 mEq/L (ref 135–145)
Total Protein: 7.3 g/dL (ref 6.0–8.3)

## 2015-12-25 LAB — LIPID PANEL
CHOL/HDL RATIO: 4
CHOLESTEROL: 181 mg/dL (ref 0–200)
HDL: 43.7 mg/dL (ref >39.00)
LDL Cholesterol: 124 mg/dL — ABNORMAL HIGH (ref 0–99)
NonHDL: 136.97
TRIGLYCERIDES: 66 mg/dL (ref 0.0–149.0)
VLDL: 13.2 mg/dL (ref 0.0–40.0)

## 2015-12-25 LAB — TSH: TSH: 4.33 u[IU]/mL (ref 0.35–4.50)

## 2015-12-25 NOTE — Progress Notes (Signed)
Pre visit review using our clinic review tool, if applicable. No additional management support is needed unless otherwise documented below in the visit note. 

## 2015-12-25 NOTE — Assessment & Plan Note (Signed)
Chronic Asymptomatic EKG today, rate controlled On ASA 81 mg daily Will follow up with Dr Lovena Le the end of this year - will discuss anti-coagulation at that time

## 2015-12-25 NOTE — Assessment & Plan Note (Signed)
Check tsh  Titrate med dose if needed  

## 2015-12-26 LAB — PSA, TOTAL AND FREE
PSA, Free Pct: 22 % — ABNORMAL LOW (ref >25)
PSA, Free: 0.35 ng/mL
PSA: 1.58 ng/mL (ref <=4.00)

## 2015-12-27 ENCOUNTER — Encounter: Payer: Self-pay | Admitting: Internal Medicine

## 2015-12-28 ENCOUNTER — Encounter: Payer: Self-pay | Admitting: Internal Medicine

## 2015-12-29 NOTE — Telephone Encounter (Signed)
Please advise 

## 2016-02-24 ENCOUNTER — Other Ambulatory Visit: Payer: Self-pay | Admitting: Internal Medicine

## 2016-05-19 ENCOUNTER — Other Ambulatory Visit: Payer: Self-pay | Admitting: Internal Medicine

## 2016-06-21 ENCOUNTER — Encounter: Payer: Self-pay | Admitting: *Deleted

## 2016-07-09 ENCOUNTER — Encounter: Payer: Self-pay | Admitting: Internal Medicine

## 2016-07-09 ENCOUNTER — Ambulatory Visit (INDEPENDENT_AMBULATORY_CARE_PROVIDER_SITE_OTHER): Payer: Medicare Other | Admitting: Internal Medicine

## 2016-07-09 VITALS — BP 136/98 | HR 70 | Ht 75.0 in | Wt 246.8 lb

## 2016-07-09 DIAGNOSIS — I482 Chronic atrial fibrillation, unspecified: Secondary | ICD-10-CM

## 2016-07-09 NOTE — Patient Instructions (Addendum)
Medication Instructions:  Your physician recommends that you continue on your current medications as directed. Please refer to the Current Medication list given to you today.   Labwork: None Ordered   Testing/Procedures: None Ordered    Follow-Up: Your physician recommends that you schedule a follow-up appointment in: 3 months with Dr. Taylor   Any Other Special Instructions Will Be Listed Below (If Applicable).     If you need a refill on your cardiac medications before your next appointment, please call your pharmacy.   

## 2016-07-09 NOTE — Progress Notes (Signed)
HPI Elijah [name] returns today for followup. He is a very pleasant 66 year old man with a history of chronic atrial fibrillation, and preserved left ventricular function. He denies fevers or chills. He has had no chest pain or sob. No syncope. He is exercising regularly. He has not gained weight. No Known Allergies   Current Outpatient Prescriptions  Medication Sig Dispense Refill  . aspirin 81 MG EC tablet Take 162 mg by mouth daily. Reported on 10/06/2015    . Omega-3 Fatty Acids (FISH OIL PO) Take 2 capsules by mouth daily.     Marland Kitchen SYNTHROID 112 MCG tablet TAKE 1 TABLET BY MOUTH DAILY EXCEPT 1 1/2 TABLETS ON SUNDAYS. 102 tablet 1   No current facility-administered medications for this visit.      Past Medical History:  Diagnosis Date  . Adenomatous colon polyp   . Atrial fibrillation [facility]) 2012   Dr Cristopher Peru seen as OP  . Atrial fibrillation ([place]) 11/22-23/2013   presentation as nausea, dizziness & chest tightness  . Diverticulosis 2007  . Hyperlipidemia   . Thyroid disease    hypothyroidism    ROS:   All systems reviewed and negative except as noted in the HPI.   Past Surgical History:  Procedure Laterality Date  . CHEST TUBE INSERTION  1982   Spontaneous Pneumothorax  . COLONOSCOPY W/ POLYPECTOMY  2007   Adenomatous Polyp; [facility], Dr Olevia Perches  . Holter monitor-48 hour  03/2011, 12/2010   Dr Crissie Sickles  . TONSILLECTOMY    . TRANSTHORACIC ECHOCARDIOGRAM   12/2010,      Family History  Problem Relation Age of Onset  . Stroke Mother 74    cns aneurysm bleed X2  . Breast cancer Mother   . Valvular heart disease Mother     valve replacement  . Hypertension Mother   . Cerebral aneurysm Sister 38  . Hypertension Father   . Hypertension Sister   . Diabetes Neg Hx   . Colon cancer Neg Hx      Social History   Social History  . Marital status: Married    Spouse name: N/A  . Number of children: N/A  . Years of education: N/A   Occupational History  .  Not on file.   Social History Main Topics  . Smoking status: Never Smoker  . Smokeless tobacco: Never Used  . Alcohol use No  . Drug use: No  . Sexual activity: No   Other Topics Concern  . Not on file   Social History Narrative   Exercise: 5 days of aerobic, two days of weights     BP (!) 136/98   Pulse 70   Ht 6\' 3"  (1.905 m)   Wt 246 lb 12.8 oz (111.9 kg)   BMI 30.85 kg/m   Physical Exam:  Well appearing 66 year old man, NAD HEENT: Unremarkable Neck:  No JVD, no thyromegally Lungs:  Clear with no wheezes, rales, or rhonchi. HEART:  IRegular rate rhythm, no murmurs, no rubs, no clicks Abd:  soft, positive bowel sounds, no organomegally, no rebound, no guarding Ext:  2 plus pulses, no edema, no cyanosis, no clubbing Skin:  No rashes no nodules Neuro:  CN II through XII intact, motor grossly intact   Assess/Plan:  1. Chronic atrial fib - he will continue a strategy of rate control. 2. coags - he has turned 44. He has not had a h/o HTN but his blood pressure is high today. I have asked him to keep a  weekly log of his blood pressures. I will see him back in 3 months and review his pressures. If he has developed HTN, then we will start a NOAC. 3. Dyslipidemia - He is on an omega 3. Will plan to check fasting lipids.  [name].D.

## 2016-07-22 ENCOUNTER — Encounter: Payer: Self-pay | Admitting: Family

## 2016-07-22 ENCOUNTER — Ambulatory Visit (INDEPENDENT_AMBULATORY_CARE_PROVIDER_SITE_OTHER): Payer: Medicare Other | Admitting: Family

## 2016-07-22 VITALS — BP 110/82 | HR 90 | Temp 97.5°F | Resp 16 | Ht 75.0 in | Wt 248.0 lb

## 2016-07-22 DIAGNOSIS — R52 Pain, unspecified: Secondary | ICD-10-CM | POA: Insufficient documentation

## 2016-07-22 DIAGNOSIS — J019 Acute sinusitis, unspecified: Secondary | ICD-10-CM | POA: Insufficient documentation

## 2016-07-22 DIAGNOSIS — J069 Acute upper respiratory infection, unspecified: Secondary | ICD-10-CM

## 2016-07-22 LAB — POCT INFLUENZA A/B
Influenza A, POC: NEGATIVE
Influenza B, POC: NEGATIVE

## 2016-07-22 NOTE — Progress Notes (Signed)
Subjective:    Patient ID: Elijah [name], male    DOB: March 03, 1951, 66 y.o.   MRN: [id number]  Chief Complaint  Patient presents with  . Nasal Congestion    congestion, cough, and some nausea, aching around knees and neck, since saturday night    HPI:  Elijah [name] is a 66 y.o. male who  has a past medical history of Adenomatous colon polyp; Atrial fibrillation ([place]) (2012); Atrial fibrillation ([place]) (11/22-23/2013); Diverticulosis (2007); Hyperlipidemia; and Thyroid disease. and presents today For an acute office visit.  This is a new problem. Associated symptoms of cough, congestion, and body aches that have going on for approximately 2 days. Endorses some nausea. Denies fevers. Describes dry heaving this morning. Modifying factors include Tylenol which seemed to help with the symptoms. Nausea has been dissipated and has been able to eat today. No recent antibiotics. No abdominal pain, constipation or diarrhea.   No Known Allergies    Outpatient Medications Prior to Visit  Medication Sig Dispense Refill  . aspirin 81 MG EC tablet Take 162 mg by mouth daily. Reported on 10/06/2015    . Omega-3 Fatty Acids (FISH OIL PO) Take 2 capsules by mouth daily.     Marland Kitchen SYNTHROID 112 MCG tablet TAKE 1 TABLET BY MOUTH DAILY EXCEPT 1 1/2 TABLETS ON SUNDAYS. 102 tablet 1   No facility-administered medications prior to visit.      Review of Systems  Constitutional: Negative for chills and fever.  HENT: Positive for congestion. Negative for ear pain, sinus pain, sinus pressure and sore throat.   Respiratory: Positive for cough. Negative for chest tightness and shortness of breath.   Cardiovascular: Negative for chest pain, palpitations and leg swelling.  Gastrointestinal: Positive for nausea and vomiting. Negative for abdominal pain, blood in stool, constipation and diarrhea.  Neurological: Negative for headaches.      Objective:    BP 110/82 (BP Location: Left Arm, Patient Position:  Sitting, Cuff Size: Large)   Pulse 90   Temp 97.5 F (36.4 C) (Oral)   Resp 16   Ht 6\' 3"  (1.905 m)   Wt 248 lb (112.5 kg)   SpO2 97%   BMI 31.00 kg/m  Nursing note and vital signs reviewed.  Physical Exam  Constitutional: He is oriented to person, place, and time. He appears well-developed and well-nourished.  Non-toxic appearance. He does not have a sickly appearance. He does not appear ill. No distress.  HENT:  Right Ear: Hearing, tympanic membrane, external ear and ear canal normal.  Left Ear: Hearing, tympanic membrane, external ear and ear canal normal.  Nose: Nose normal. Right sinus exhibits no maxillary sinus tenderness and no frontal sinus tenderness. Left sinus exhibits no maxillary sinus tenderness and no frontal sinus tenderness.  Mouth/Throat: Uvula is midline, oropharynx is clear and moist and mucous membranes are normal.  Neck: Neck supple.  Cardiovascular: Normal rate, regular rhythm, normal heart sounds and intact distal pulses.  Exam reveals no gallop and no friction rub.   No murmur heard. Pulmonary/Chest: Effort normal and breath sounds normal. No respiratory distress. He has no wheezes. He has no rales. He exhibits no tenderness.  Lymphadenopathy:    He has no cervical adenopathy.  Neurological: He is alert and oriented to person, place, and time.  Skin: Skin is warm and dry.  Psychiatric: He has a normal mood and affect. His behavior is normal. Judgment and thought content normal.       Assessment & Plan:  Problem List Items Addressed This Visit      Respiratory   Acute upper respiratory infection - Primary    In office influenza test negative. Symptoms and exam consistent with acute upper respiratory infection most likely viral. Continue over-the-counter medications as needed for symptom relief and supportive care. Follow-up if symptoms worsen or do not improve.      Relevant Orders   POCT Influenza A/B (Completed)       I am having Mr. Fukuda  maintain his Omega-3 Fatty Acids (FISH OIL PO), aspirin, and SYNTHROID.   Follow-up: Return if symptoms worsen or fail to improve.  Mauricio Po, FNP

## 2016-07-22 NOTE — Patient Instructions (Signed)
Thank you for choosing [facility].  SUMMARY AND INSTRUCTIONS:  Medication:  Your prescription(s) have been submitted to your pharmacy or been printed and provided for you. Please take as directed and contact our office if you believe you are having problem(s) with the medication(s) or have any questions.  Follow up:  If your symptoms worsen or fail to improve, please contact our office for further instruction, or in case of emergency go directly to the emergency room at the closest medical facility.    General Recommendations:    Please drink plenty of fluids.  Get plenty of rest   Sleep in humidified air  Use saline nasal sprays  Netti pot   OTC Medications:  Decongestants - helps relieve congestion   Flonase (generic fluticasone) or Nasacort (generic triamcinolone) - please make sure to use the "cross-over" technique at a 45 degree angle towards the opposite eye as opposed to straight up the nasal passageway.   Sudafed (generic pseudoephedrine - Note this is the one that is available behind the pharmacy counter); Products with phenylephrine (-PE) may also be used but is often not as effective as pseudoephedrine.   If you have HIGH BLOOD PRESSURE - Coricidin HBP; AVOID any product that is -D as this contains pseudoephedrine which may increase your blood pressure.  Afrin (oxymetazoline) every 6-8 hours for up to 3 days.   Allergies - helps relieve runny nose, itchy eyes and sneezing   Claritin (generic loratidine), Allegra (fexofenidine), or Zyrtec (generic cyrterizine) for runny nose. These medications should not cause drowsiness.  Note - Benadryl (generic diphenhydramine) may be used however may cause drowsiness  Cough -   Delsym or Robitussin (generic dextromethorphan)  Expectorants - helps loosen mucus to ease removal   Mucinex (generic guaifenesin) as directed on the package.  Headaches / General Aches   Tylenol (generic acetaminophen) - DO NOT  EXCEED 3 grams (3,000 mg) in a 24 hour time period  Advil/Motrin (generic ibuprofen)   Sore Throat -   Salt water gargle   Chloraseptic (generic benzocaine) spray or lozenges / Sucrets (generic dyclonine)    Upper Respiratory Infection, Adult Most upper respiratory infections (URIs) are caused by a virus. A URI affects the nose, throat, and upper air passages. The most common type of URI is often called "the common cold." Follow these instructions at home:  Take medicines only as told by your doctor.  Gargle warm saltwater or take cough drops to comfort your throat as told by your doctor.  Use a warm mist humidifier or inhale steam from a shower to increase air moisture. This may make it easier to breathe.  Drink enough fluid to keep your pee (urine) clear or pale yellow.  Eat soups and other clear broths.  Have a healthy diet.  Rest as needed.  Go back to work when your fever is gone or your doctor says it is okay.  You may need to stay home longer to avoid giving your URI to others.  You can also wear a face mask and wash your hands often to prevent spread of the virus.  Use your inhaler more if you have asthma.  Do not use any tobacco products, including cigarettes, chewing tobacco, or electronic cigarettes. If you need help quitting, ask your doctor. Contact a doctor if:  You are getting worse, not better.  Your symptoms are not helped by medicine.  You have chills.  You are getting more short of breath.  You have brown or red  mucus.  You have yellow or brown discharge from your nose.  You have pain in your face, especially when you bend forward.  You have a fever.  You have puffy (swollen) neck glands.  You have pain while swallowing.  You have white areas in the back of your throat. Get help right away if:  You have very bad or constant:  Headache.  Ear pain.  Pain in your forehead, behind your eyes, and over your cheekbones (sinus  pain).  Chest pain.  You have long-lasting (chronic) lung disease and any of the following:  Wheezing.  Long-lasting cough.  Coughing up blood.  A change in your usual mucus.  You have a stiff neck.  You have changes in your:  Vision.  Hearing.  Thinking.  Mood. This information is not intended to replace advice given to you by your health care provider. Make sure you discuss any questions you have with your health care provider. Document Released: 11/20/2007 Document Revised: 02/04/2016 Document Reviewed: 09/08/2013 Elsevier Interactive Patient Education  2017 [employer].

## 2016-07-22 NOTE — Assessment & Plan Note (Signed)
In office influenza test negative. Symptoms and exam consistent with acute upper respiratory infection most likely viral. Continue over-the-counter medications as needed for symptom relief and supportive care. Follow-up if symptoms worsen or do not improve.

## 2016-10-08 ENCOUNTER — Ambulatory Visit (INDEPENDENT_AMBULATORY_CARE_PROVIDER_SITE_OTHER): Payer: Medicare Other | Admitting: Internal Medicine

## 2016-10-08 ENCOUNTER — Encounter: Payer: Self-pay | Admitting: Internal Medicine

## 2016-10-08 VITALS — BP 122/86 | HR 87 | Ht 75.0 in | Wt 250.2 lb

## 2016-10-08 DIAGNOSIS — I482 Chronic atrial fibrillation, unspecified: Secondary | ICD-10-CM

## 2016-10-08 NOTE — Patient Instructions (Addendum)
Medication Instructions:    Your physician recommends that you continue on your current medications as directed. Please refer to the Current Medication list given to you today.  Labwork:  None ordered  Testing/Procedures:  None ordered  Follow-Up:  Your physician wants you to follow-up in: 6 months with Dr. Lovena Le.  You will receive a reminder letter in the mail two months in advance. If you don't receive a letter, please call our office to schedule the follow-up appointment.  - If you need a refill on your cardiac medications before your next appointment, please call your pharmacy.   Any Other Special Instructions Will Be Listed Below (If Applicable).   Thank you for choosing [facility]!!

## 2016-10-08 NOTE — Progress Notes (Signed)
HPI Elijah [name] returns today for followup. He is a very pleasant 66 year old man with a history of chronic atrial fibrillation, and preserved left ventricular function. He denies fevers or chills. He has had no chest pain or sob. No syncope. He is exercising regularly. He has not gained weight. When I saw him 3 months ago, his pressure was a little high and we discussed systemic anti-coagulation. He brings a log of his pressures today for review. He is not hypertensive. He has not lost weight. No Known Allergies   Current Outpatient Prescriptions  Medication Sig Dispense Refill  . aspirin 81 MG EC tablet Take 162 mg by mouth daily. Reported on 10/06/2015    . Omega-3 Fatty Acids (FISH OIL PO) Take 2 capsules by mouth daily.     Marland Kitchen SYNTHROID 112 MCG tablet TAKE 1 TABLET BY MOUTH DAILY EXCEPT 1 1/2 TABLETS ON SUNDAYS. 102 tablet 1   No current facility-administered medications for this visit.      Past Medical History:  Diagnosis Date  . Adenomatous colon polyp   . Atrial fibrillation [facility]) 2012   Dr Cristopher Peru seen as OP  . Atrial fibrillation ([place]) 11/22-23/2013   presentation as nausea, dizziness & chest tightness  . Diverticulosis 2007  . Hyperlipidemia   . Thyroid disease    hypothyroidism    ROS:   All systems reviewed and negative except as noted in the HPI.   Past Surgical History:  Procedure Laterality Date  . CHEST TUBE INSERTION  1982   Spontaneous Pneumothorax  . COLONOSCOPY W/ POLYPECTOMY  2007   Adenomatous Polyp; [facility], Dr Olevia Perches  . Holter monitor-48 hour  03/2011, 12/2010   Dr Crissie Sickles  . TONSILLECTOMY    . TRANSTHORACIC ECHOCARDIOGRAM   12/2010,      Family History  Problem Relation Age of Onset  . Stroke Mother 59    cns aneurysm bleed X2  . Breast cancer Mother   . Valvular heart disease Mother     valve replacement  . Hypertension Mother   . Cerebral aneurysm Sister 28  . Hypertension Father   . Hypertension Sister   . Diabetes Neg Hx    . Colon cancer Neg Hx      Social History   Social History  . Marital status: Married    Spouse name: N/A  . Number of children: N/A  . Years of education: N/A   Occupational History  . Not on file.   Social History Main Topics  . Smoking status: Never Smoker  . Smokeless tobacco: Never Used  . Alcohol use No  . Drug use: No  . Sexual activity: No   Other Topics Concern  . Not on file   Social History Narrative   Exercise: 5 days of aerobic, two days of weights     BP 122/86   Pulse 87   Ht 6\' 3"  (1.905 m)   Wt 250 lb 3.2 oz (113.5 kg)   BMI 31.27 kg/m   Physical Exam:  Well appearing 66 year old man, NAD HEENT: Unremarkable Neck:  No JVD, no thyromegally Lungs:  Clear with no wheezes, rales, or rhonchi. HEART:  IRegular rate rhythm, no murmurs, no rubs, no clicks Abd:  soft, positive bowel sounds, no organomegally, no rebound, no guarding Ext:  2 plus pulses, no edema, no cyanosis, no clubbing Skin:  No rashes no nodules Neuro:  CN II through XII intact, motor grossly intact  ECG - atrial fib with a cvr  Assess/Plan:  1. Chronic atrial fib - he will continue a strategy of rate control. 2. coags - he has turned 28. He has not had a h/o HTN. I have asked him to keep a weekly log of his blood pressures. I will see him back in 6 months and review his pressures. If he has developed HTN, then we will start a NOAC. 3. Dyslipidemia - He is on an omega 3. Will plan to check fasting lipids.  [name].D.

## 2016-11-05 ENCOUNTER — Other Ambulatory Visit: Payer: Self-pay | Admitting: Internal Medicine

## 2016-12-13 ENCOUNTER — Other Ambulatory Visit: Payer: Self-pay | Admitting: Internal Medicine

## 2017-02-21 ENCOUNTER — Encounter: Payer: Medicare Other | Admitting: Internal Medicine

## 2017-03-13 NOTE — Patient Instructions (Addendum)
[name] , Thank you for taking time to come for your Medicare Wellness Visit. I appreciate your ongoing commitment to your health goals. Please review the following plan we discussed and let me know if I can assist you in the future.   These are the goals we discussed: Goals    Work on weight loss      This is a list of the screening recommended for you and due dates:  Health Maintenance  Topic Date Due  . Pneumonia vaccines (1 of 2 - PCV13) 03/14/2017  . Flu Shot  03/14/17  . Colon Cancer Screening  04/05/2018  . Tetanus Vaccine  12/24/2025  .  Hepatitis C: One time screening is recommended by Center for Disease Control  (CDC) for  adults born from [birth year] through 1965.   Completed  . HIV Screening  Completed    Test(s) ordered today. Your results will be released to [place] (or called to you) after review, usually within 72hours after test completion. If any changes need to be made, you will be notified at that same time.  All other Health Maintenance issues reviewed.   All recommended immunizations and age-appropriate screenings are up-to-date or discussed.  Flu and prevnar pneumonia immunizations administered today.   Medications reviewed and updated.  No changes recommended at this time.   Please followup in one year    Health Maintenance, Male A healthy lifestyle and preventive care is important for your health and wellness. Ask your health care provider about what schedule of regular examinations is right for you. What should I know about weight and diet? Eat a Healthy Diet  Eat plenty of vegetables, fruits, whole grains, low-fat dairy products, and lean protein.  Do not eat a lot of foods high in solid fats, added sugars, or salt.  Maintain a Healthy Weight Regular exercise can help you achieve or maintain a healthy weight. You should:  Do at least 150 minutes of exercise each week. The exercise should increase your heart rate and make you sweat  (moderate-intensity exercise).  Do strength-training exercises at least twice a week.  Watch Your Levels of Cholesterol and Blood Lipids  Have your blood tested for lipids and cholesterol every 5 years starting at 66 years of age. If you are at high risk for heart disease, you should start having your blood tested when you are 66 years old. You may need to have your cholesterol levels checked more often if: ? Your lipid or cholesterol levels are high. ? You are older than 66 years of age. ? You are at high risk for heart disease.  What should I know about cancer screening? Many types of cancers can be detected early and may often be prevented. Lung Cancer  You should be screened every year for lung cancer if: ? You are a current smoker who has smoked for at least 30 years. ? You are a former smoker who has quit within the past 15 years.  Talk to your health care provider about your screening options, when you should start screening, and how often you should be screened.  Colorectal Cancer  Routine colorectal cancer screening usually begins at 66 years of age and should be repeated every 5-10 years until you are 66 years old. You may need to be screened more often if early forms of precancerous polyps or small growths are found. Your health care provider may recommend screening at an earlier age if you have risk factors for colon cancer.  Your health care provider may recommend using home test kits to check for hidden blood in the stool.  A small camera at the end of a tube can be used to examine your colon (sigmoidoscopy or colonoscopy). This checks for the earliest forms of colorectal cancer.  Prostate and Testicular Cancer  Depending on your age and overall health, your health care provider may do certain tests to screen for prostate and testicular cancer.  Talk to your health care provider about any symptoms or concerns you have about testicular or prostate cancer.  Skin  Cancer  Check your skin from head to toe regularly.  Tell your health care provider about any new moles or changes in moles, especially if: ? There is a change in a mole's size, shape, or color. ? You have a mole that is larger than a pencil eraser.  Always use sunscreen. Apply sunscreen liberally and repeat throughout the day.  Protect yourself by wearing long sleeves, pants, a wide-brimmed hat, and sunglasses when outside.  What should I know about heart disease, diabetes, and high blood pressure?  If you are 17-50 years of age, have your blood pressure checked every 3-5 years. If you are 15 years of age or older, have your blood pressure checked every year. You should have your blood pressure measured twice-once when you are at a hospital or clinic, and once when you are not at a hospital or clinic. Record the average of the two measurements. To check your blood pressure when you are not at a hospital or clinic, you can use: ? An automated blood pressure machine at a pharmacy. ? A home blood pressure monitor.  Talk to your health care provider about your target blood pressure.  If you are between 70-26 years old, ask your health care provider if you should take aspirin to prevent heart disease.  Have regular diabetes screenings by checking your fasting blood sugar level. ? If you are at a normal weight and have a low risk for diabetes, have this test once every three years after the age of 81. ? If you are overweight and have a high risk for diabetes, consider being tested at a younger age or more often.  A one-time screening for abdominal aortic aneurysm (AAA) by ultrasound is recommended for men aged 55-75 years who are current or former smokers. What should I know about preventing infection? Hepatitis B If you have a higher risk for hepatitis B, you should be screened for this virus. Talk with your health care provider to find out if you are at risk for hepatitis B  infection. Hepatitis C Blood testing is recommended for:  Everyone born from [birth year] through 1965.  Anyone with known risk factors for hepatitis C.  Sexually Transmitted Diseases (STDs)  You should be screened each year for STDs including gonorrhea and chlamydia if: ? You are sexually active and are younger than 66 years of age. ? You are older than 66 years of age and your health care provider tells you that you are at risk for this type of infection. ? Your sexual activity has changed since you were last screened and you are at an increased risk for chlamydia or gonorrhea. Ask your health care provider if you are at risk.  Talk with your health care provider about whether you are at high risk of being infected with HIV. Your health care provider may recommend a prescription medicine to help prevent HIV infection.  What else can I do?  Schedule regular health, dental, and eye exams.  Stay current with your vaccines (immunizations).  Do not use any tobacco products, such as cigarettes, chewing tobacco, and e-cigarettes. If you need help quitting, ask your health care provider.  Limit alcohol intake to no more than 2 drinks per day. One drink equals 12 ounces of beer, 5 ounces of wine, or 1 ounces of hard liquor.  Do not use street drugs.  Do not share needles.  Ask your health care provider for help if you need support or information about quitting drugs.  Tell your health care provider if you often feel depressed.  Tell your health care provider if you have ever been abused or do not feel safe at home. This information is not intended to replace advice given to you by your health care provider. Make sure you discuss any questions you have with your health care provider. Document Released: 11/30/2007 Document Revised: 01/31/2016 Document Reviewed: 03/07/2015 Elsevier Interactive Patient Education  [employer].

## 2017-03-13 NOTE — Progress Notes (Signed)
Subjective:    Patient ID: Elijah [name], male    DOB: 1950-10-16, 66 y.o.   MRN: [id number]  HPI Here for welcome to medicare wellness exam and an annual physical exam.   I have personally reviewed and have noted 1.The patient's medical and social history 2.Their use of alcohol, tobacco or illicit drugs 3.Their current medications and supplements 4.The patient's functional ability including ADL's, fall risks, home                 safety risk and hearing or visual impairment. 5.Diet and physical activities 6.Evidence for depression or mood disorders 7.Care team reviewed  - cardiology - Dr Lovena Le, Eye   Are there smokers in your home (other than you)? No  Risk Factors Exercise: cardio - walking/running, weights Dietary issues discussed: snacks too much  Cardiac risk factors: advanced age, hyperlipidemia, and obesity (BMI >= 30 kg/m2).  Depression Screen  Have you felt down, depressed or hopeless? No  Have you felt little interest or pleasure in doing things?  No  Activities of Daily Living In your present state of health, do you have any difficulty performing the following activities?:  Driving? No Managing money?  No Feeding yourself? No Getting from bed to chair? No Climbing a flight of stairs? No Preparing food and eating?: No Bathing or showering? No Getting dressed: No Getting to/using the toilet? No Moving around from place to place: No In the past year have you fallen or had a near fall?: No   Do you have more than one partner?  No  Hearing Difficulties: No Do you often ask people to speak up or repeat themselves? No Do you experience ringing or noises in your ears? No Do you have difficulty understanding soft or whispered voices? No Vision:              Any change in vision:  no             Up to date with eye exam:   yes  Memory:  Do you feel that you have a problem with memory?  No  Do you often misplace items? No  Do you feel safe at home?  Yes  Cognitive Testing  Alert, Orientated? Yes  Normal Appearance? Yes  Recall of three objects?  Yes  Can perform simple calculations? Yes  Displays appropriate judgment? Yes  Can read the correct time from a watch face? Yes   Advanced Directives have been discussed with the patient? Yes - in place   Medications and allergies reviewed with patient and updated if appropriate.  Patient Active Problem List   Diagnosis Date Noted  . Body aches 07/22/2016  . Adenomatous polyp of colon 11/22/2013  . Atrial fibrillation ([place]) 12/31/2010  . Hyperlipidemia 04/26/2008  . Hypothyroidism 02/19/2007    Current Outpatient Prescriptions on File Prior to Visit  Medication Sig Dispense Refill  . aspirin 81 MG EC tablet Take 162 mg by mouth daily. Reported on 10/06/2015    . Omega-3 Fatty Acids (FISH OIL PO) Take 2 capsules by mouth daily.     Marland Kitchen SYNTHROID 112 MCG tablet TAKE 1 TABLET BY MOUTH DAILY, EXCEPT 1 AND 1/2 TABLETS ON SUNDAYS -- Office visit needed for further refills 102 tablet 0   No current facility-administered medications on file prior to visit.     Past Medical History:  Diagnosis Date  . Adenomatous colon polyp   . Atrial fibrillation [facility]) 2012   Dr Cristopher Peru seen  as OP  . Atrial fibrillation ([place]) 11/22-23/2013   presentation as nausea, dizziness & chest tightness  . Diverticulosis 2007  . Hyperlipidemia   . Thyroid disease    hypothyroidism    Past Surgical History:  Procedure Laterality Date  . CHEST TUBE INSERTION  1982   Spontaneous Pneumothorax  . COLONOSCOPY W/ POLYPECTOMY  2007   Adenomatous Polyp; [facility], Dr Olevia Perches  . Holter monitor-48 hour  03/2011, 12/2010   Dr Crissie Sickles  . TONSILLECTOMY    . TRANSTHORACIC ECHOCARDIOGRAM   12/2010,     Social History   Social History  . Marital status: Married    Spouse name: N/A  . Number of children: N/A  . Years of education: N/A    Social History Main Topics  . Smoking status: Never Smoker  . Smokeless tobacco: Never Used  . Alcohol use No  . Drug use: No  . Sexual activity: No   Other Topics Concern  . None   Social History Narrative   Exercise: 5 days of aerobic, two days of weights    Family History  Problem Relation Age of Onset  . Stroke Mother 39       cns aneurysm bleed X2  . Breast cancer Mother   . Valvular heart disease Mother        valve replacement  . Hypertension Mother   . Cerebral aneurysm Sister 70  . Hypertension Father   . Hypertension Sister   . Diabetes Neg Hx   . Colon cancer Neg Hx     Review of Systems  Constitutional: Negative for chills and fever.  HENT: Positive for hearing loss (mild). Negative for tinnitus.   Eyes: Negative for visual disturbance.  Respiratory: Negative for cough, shortness of breath and wheezing.   Cardiovascular: Negative for chest pain, palpitations and leg swelling.  Gastrointestinal: Negative for abdominal pain, blood in stool, constipation, diarrhea and nausea.  Genitourinary: Negative for difficulty urinating, dysuria and hematuria.  Musculoskeletal: Negative for arthralgias and back pain.  Skin: Negative for color change and rash.  Neurological: Negative for dizziness, light-headedness and headaches.  Psychiatric/Behavioral: Negative for dysphoric mood. The patient is not nervous/anxious.        Objective:   Vitals:   03/14/17 0812  BP: 118/86  Pulse: 68  Resp: 16  Temp: 98.1 F (36.7 C)  SpO2: 98%   Filed Weights   03/14/17 0812  Weight: 253 lb (114.8 kg)   Body mass index is 31.62 kg/m.  Wt Readings from Last 3 Encounters:  03/14/17 253 lb (114.8 kg)  10/08/16 250 lb 3.2 oz (113.5 kg)  07/22/16 248 lb (112.5 kg)     Physical Exam Constitutional: He appears well-developed and well-nourished. No distress.  HENT:  Head: Normocephalic and atraumatic.  Right Ear: External ear normal.  Left Ear: External ear normal.   Mouth/Throat: Oropharynx is clear and moist.  Normal ear canals and TM b/l  Eyes: Conjunctivae and EOM are normal.  Neck: Neck supple. No tracheal deviation present. No thyromegaly present.  No carotid bruit  Cardiovascular: Normal rate, regular rhythm, normal heart sounds and intact distal pulses.   No murmur heard. Pulmonary/Chest: Effort normal and breath sounds normal. No respiratory distress. He has no wheezes. He has no rales.  Abdominal: Soft. He exhibits no distension. There is no tenderness.  Genitourinary: deferred - will get just psa Musculoskeletal: He exhibits no edema.  Lymphadenopathy:   He has no cervical adenopathy.  Skin: Skin is warm  and dry. He is not diaphoretic.  Psychiatric: He has a normal mood and affect. His behavior is normal.     Visual Acuity Screening   Right eye Left eye Both eyes  Without correction: 20/25 20/25 20/20   With correction:           Assessment & Plan:   Wellness Exam: Immunizations  Flu, prevnar today, shingrix discussed Colonoscopy   Up to date  Eye exam   Up to date  Hearing loss   Minimal - has had it evaluated in past Memory concerns/difficulties   none Independent of ADLs   fully Stressed the importance of regular exercise   Patient received copy of preventative screening tests/immunizations recommended for the next 5-10 years.   Physical exam: Screening blood work ordered Immunizations  Flu, prevnar today, shingrix discussed Colonoscopy   Up to date  Eye exams    Up to date  EKG   - not needed today, will defer to cardio - had one in spring and in fall Exercise   - regular - cardio and weights Weight   - advised weight loss Skin      No conerns Substance abuse  none  See Problem List for Assessment and Plan of chronic medical problems.   FU in one year

## 2017-03-14 ENCOUNTER — Encounter: Payer: Self-pay | Admitting: Internal Medicine

## 2017-03-14 ENCOUNTER — Other Ambulatory Visit (INDEPENDENT_AMBULATORY_CARE_PROVIDER_SITE_OTHER): Payer: Medicare Other

## 2017-03-14 ENCOUNTER — Other Ambulatory Visit: Payer: Self-pay | Admitting: Internal Medicine

## 2017-03-14 ENCOUNTER — Ambulatory Visit (INDEPENDENT_AMBULATORY_CARE_PROVIDER_SITE_OTHER): Payer: Medicare Other | Admitting: Internal Medicine

## 2017-03-14 VITALS — BP 118/86 | HR 68 | Temp 98.1°F | Resp 16 | Ht 75.0 in | Wt 253.0 lb

## 2017-03-14 DIAGNOSIS — Z23 Encounter for immunization: Secondary | ICD-10-CM

## 2017-03-14 DIAGNOSIS — E785 Hyperlipidemia, unspecified: Secondary | ICD-10-CM | POA: Diagnosis not present

## 2017-03-14 DIAGNOSIS — I482 Chronic atrial fibrillation, unspecified: Secondary | ICD-10-CM

## 2017-03-14 DIAGNOSIS — E039 Hypothyroidism, unspecified: Secondary | ICD-10-CM

## 2017-03-14 DIAGNOSIS — Z125 Encounter for screening for malignant neoplasm of prostate: Secondary | ICD-10-CM

## 2017-03-14 DIAGNOSIS — Z0000 Encounter for general adult medical examination without abnormal findings: Secondary | ICD-10-CM

## 2017-03-14 LAB — CBC WITH DIFFERENTIAL/PLATELET
BASOS PCT: 0.8 % (ref 0.0–3.0)
Basophils Absolute: 0.1 10*3/uL (ref 0.0–0.1)
EOS ABS: 0.1 10*3/uL (ref 0.0–0.7)
Eosinophils Relative: 2.1 % (ref 0.0–5.0)
HCT: 44.5 % (ref 39.0–52.0)
HEMOGLOBIN: 14.9 g/dL (ref 13.0–17.0)
Lymphocytes Relative: 40 % (ref 12.0–46.0)
Lymphs Abs: 2.7 10*3/uL (ref 0.7–4.0)
MCHC: 33.3 g/dL (ref 30.0–36.0)
MCV: 91.1 fl (ref 78.0–100.0)
MONO ABS: 0.8 10*3/uL (ref 0.1–1.0)
Monocytes Relative: 11.3 % (ref 3.0–12.0)
NEUTROS ABS: 3.1 10*3/uL (ref 1.4–7.7)
Neutrophils Relative %: 45.8 % (ref 43.0–77.0)
PLATELETS: 174 10*3/uL (ref 150.0–400.0)
RBC: 4.89 Mil/uL (ref 4.22–5.81)
RDW: 12.8 % (ref 11.5–15.5)
WBC: 6.8 10*3/uL (ref 4.0–10.5)

## 2017-03-14 LAB — COMPREHENSIVE METABOLIC PANEL
ALT: 8 U/L (ref 0–53)
AST: 26 U/L (ref 0–37)
Albumin: 3.9 g/dL (ref 3.5–5.2)
Alkaline Phosphatase: 53 U/L (ref 39–117)
BUN: 15 mg/dL (ref 6–23)
CHLORIDE: 102 meq/L (ref 96–112)
CO2: 27 meq/L (ref 19–32)
CREATININE: 1.14 mg/dL (ref 0.40–1.50)
Calcium: 9 mg/dL (ref 8.4–10.5)
GFR: 68.36 mL/min (ref >60.00)
GLUCOSE: 95 mg/dL (ref 70–99)
Potassium: 4.1 mEq/L (ref 3.5–5.1)
SODIUM: 137 meq/L (ref 135–145)
Total Bilirubin: 1.2 mg/dL (ref 0.2–1.2)
Total Protein: 6.7 g/dL (ref 6.0–8.3)

## 2017-03-14 LAB — PSA, MEDICARE: PSA: 2.46 ng/mL (ref 0.10–4.00)

## 2017-03-14 LAB — LIPID PANEL
CHOL/HDL RATIO: 4
Cholesterol: 149 mg/dL (ref 0–200)
HDL: 36.4 mg/dL — ABNORMAL LOW (ref >39.00)
LDL CALC: 101 mg/dL — AB (ref 0–99)
NonHDL: 112.34
Triglycerides: 55 mg/dL (ref 0.0–149.0)
VLDL: 11 mg/dL (ref 0.0–40.0)

## 2017-03-14 LAB — TSH: TSH: 6.24 u[IU]/mL — AB (ref 0.35–4.50)

## 2017-03-14 NOTE — Assessment & Plan Note (Signed)
Check lipid panel  Not on medication Regular exercise and healthy diet encouraged Advised weight loss

## 2017-03-14 NOTE — Assessment & Plan Note (Signed)
Check tsh  Titrate med dose if needed  

## 2017-03-14 NOTE — Assessment & Plan Note (Signed)
Following with Dr Lovena Le Asymptomatic Not an A/C BP well controlled - not on medication, HR well controlled

## 2017-03-15 ENCOUNTER — Encounter: Payer: Self-pay | Admitting: Internal Medicine

## 2017-03-17 ENCOUNTER — Other Ambulatory Visit: Payer: Self-pay | Admitting: Emergency Medicine

## 2017-03-17 DIAGNOSIS — E039 Hypothyroidism, unspecified: Secondary | ICD-10-CM

## 2017-03-17 MED ORDER — SYNTHROID 125 MCG PO TABS: 125.0000 ug | ORAL_TABLET | Freq: Every day | ORAL | 1 refills | Status: DC

## 2017-03-18 NOTE — Telephone Encounter (Signed)
Spoke with pt on 03/17/17 to clarify

## 2017-04-02 ENCOUNTER — Encounter: Payer: Self-pay | Admitting: Internal Medicine

## 2017-04-15 ENCOUNTER — Ambulatory Visit (INDEPENDENT_AMBULATORY_CARE_PROVIDER_SITE_OTHER): Payer: Medicare Other | Admitting: Internal Medicine

## 2017-04-15 ENCOUNTER — Encounter: Payer: Self-pay | Admitting: Internal Medicine

## 2017-04-15 VITALS — BP 112/88 | HR 92 | Ht 75.0 in | Wt 248.2 lb

## 2017-04-15 DIAGNOSIS — I482 Chronic atrial fibrillation, unspecified: Secondary | ICD-10-CM

## 2017-04-15 NOTE — Progress Notes (Signed)
HPI [name] returns today for ongoing followup of atrial fib. He is a pleasant 66 yo man with a h/o chronic atrial fib. He does not feel his atrial fib. He is active and has class 1 symptoms. He denies chest pain or sob. No edema. He is exercising 4 hours a week. No limitation. He was instructed to keep a log of his blood pressures but did not bring his log in. He thinks that his pressures have been controlled.  No Known Allergies   Current Outpatient Prescriptions  Medication Sig Dispense Refill  . amoxicillin (AMOXIL) 500 MG capsule Take 500 mg by mouth 4 (four) times daily.  1  . aspirin 81 MG EC tablet Take 162 mg by mouth daily. Reported on 10/06/2015    . Omega-3 Fatty Acids (FISH OIL PO) Take 2 capsules by mouth daily.     Marland Kitchen SYNTHROID 125 MCG tablet Take 1 tablet (125 mcg total) by mouth daily before breakfast. 90 tablet 1   No current facility-administered medications for this visit.      Past Medical History:  Diagnosis Date  . Adenomatous colon polyp   . Atrial fibrillation [facility]) 2012   Dr Cristopher Peru seen as OP  . Atrial fibrillation ([place]) 11/22-23/2013   presentation as nausea, dizziness & chest tightness  . Diverticulosis 2007  . Hyperlipidemia   . Thyroid disease    hypothyroidism    ROS:   All systems reviewed and negative except as noted in the HPI.   Past Surgical History:  Procedure Laterality Date  . CHEST TUBE INSERTION  1982   Spontaneous Pneumothorax  . COLONOSCOPY W/ POLYPECTOMY  2007   Adenomatous Polyp; [facility], Dr Olevia Perches  . Holter monitor-48 hour  03/2011, 12/2010   Dr Crissie Sickles  . TONSILLECTOMY    . TRANSTHORACIC ECHOCARDIOGRAM   12/2010,      Family History  Problem Relation Age of Onset  . Stroke Mother 67       cns aneurysm bleed X2  . Breast cancer Mother   . Valvular heart disease Mother        valve replacement  . Hypertension Mother   . Cerebral aneurysm Sister 71  . Hypertension Father   . Hypertension  Sister   . Diabetes Neg Hx   . Colon cancer Neg Hx      Social History   Social History  . Marital status: Married    Spouse name: N/A  . Number of children: N/A  . Years of education: N/A   Occupational History  . Not on file.   Social History Main Topics  . Smoking status: Never Smoker  . Smokeless tobacco: Never Used  . Alcohol use No  . Drug use: No  . Sexual activity: No   Other Topics Concern  . Not on file   Social History Narrative   Exercise: 5 days of aerobic, two days of weights     BP 112/88   Pulse 92   Ht 6\' 3"  (1.905 m)   Wt 248 lb 3.2 oz (112.6 kg)   SpO2 99%   BMI 31.02 kg/m   Physical Exam:  Well appearing NAD HEENT: Unremarkable Neck:  No JVD, no thyromegally Lymphatics:  No adenopathy Back:  No CVA tenderness Lungs:  Clear HEART:  IRegular rate rhythm, no murmurs, no rubs, no clicks Abd:  soft, positive bowel sounds, no organomegally, no rebound, no guarding Ext:  2 plus pulses, no edema, no  cyanosis, no clubbing Skin:  No rashes no nodules Neuro:  CN II through XII intact, motor grossly intact  EKG - atrial fib with a  Controlled VR  Assess/Plan: 1. Atrial fib - his rate appears to be well controlled. He will continue his current meds. His CHADSVASC is 1. I do not think he has HTN but have asked him to monitor his heart as an outpatient and to call us if he has HTN. I will see him back in a year.  [name].D.

## 2017-04-15 NOTE — Patient Instructions (Signed)

## 2017-05-07 ENCOUNTER — Other Ambulatory Visit (INDEPENDENT_AMBULATORY_CARE_PROVIDER_SITE_OTHER): Payer: Medicare Other

## 2017-05-07 DIAGNOSIS — E039 Hypothyroidism, unspecified: Secondary | ICD-10-CM

## 2017-05-07 LAB — TSH: TSH: 3.41 u[IU]/mL (ref 0.35–4.50)

## 2017-05-10 ENCOUNTER — Encounter: Payer: Self-pay | Admitting: Internal Medicine

## 2017-08-15 ENCOUNTER — Ambulatory Visit: Payer: Medicare Other | Admitting: Internal Medicine

## 2017-08-15 ENCOUNTER — Encounter: Payer: Self-pay | Admitting: Internal Medicine

## 2017-08-15 VITALS — BP 132/86 | HR 93 | Temp 98.1°F | Resp 16 | Wt 254.0 lb

## 2017-08-15 DIAGNOSIS — J209 Acute bronchitis, unspecified: Secondary | ICD-10-CM

## 2017-08-15 MED ORDER — BENZONATATE 200 MG PO CAPS: 200.0000 mg | ORAL_CAPSULE | Freq: Three times a day (TID) | ORAL | 0 refills | Status: DC | PRN

## 2017-08-15 MED ORDER — DOXYCYCLINE HYCLATE 100 MG PO TABS: 100.0000 mg | ORAL_TABLET | Freq: Two times a day (BID) | ORAL | 0 refills | Status: DC

## 2017-08-15 NOTE — Progress Notes (Signed)
Subjective:    Patient ID: Elijah [name], male    DOB: 1951/01/04, 67 y.o.   MRN: [id number]  HPI The patient is here for an acute visit for cold symptoms.  His granddaughter was sick and his wife is also sick.  He states fatigue, chills, sneezing, productive cough, rattling in his chest, wheezing and headaches.  The cough seems to be getting worse and is starting to become productive.  Nothing that he has taken including rest and fluids seems to be helping.  He has tried taking cough syrup. Mucinex.    Medications and allergies reviewed with patient and updated if appropriate.  Patient Active Problem List   Diagnosis Date Noted  . Adenomatous polyp of colon 11/22/2013  . Atrial fibrillation ([place]) 12/31/2010  . Hyperlipidemia 04/26/2008  . Hypothyroidism 02/19/2007    Current Outpatient Medications on File Prior to Visit  Medication Sig Dispense Refill  . amoxicillin (AMOXIL) 500 MG capsule Take 500 mg by mouth 4 (four) times daily.  1  . aspirin 81 MG EC tablet Take 162 mg by mouth daily. Reported on 10/06/2015    . Omega-3 Fatty Acids (FISH OIL PO) Take 2 capsules by mouth daily.     Marland Kitchen SYNTHROID 125 MCG tablet Take 1 tablet (125 mcg total) by mouth daily before breakfast. 90 tablet 1   No current facility-administered medications on file prior to visit.     Past Medical History:  Diagnosis Date  . Adenomatous colon polyp   . Atrial fibrillation [facility]) 2012   Dr Cristopher Peru seen as OP  . Atrial fibrillation ([place]) 11/22-23/2013   presentation as nausea, dizziness & chest tightness  . Diverticulosis 2007  . Hyperlipidemia   . Thyroid disease    hypothyroidism    Past Surgical History:  Procedure Laterality Date  . CHEST TUBE INSERTION  1982   Spontaneous Pneumothorax  . COLONOSCOPY W/ POLYPECTOMY  2007   Adenomatous Polyp; [facility], Dr Olevia Perches  . Holter monitor-48 hour  03/2011, 12/2010   Dr Crissie Sickles  . TONSILLECTOMY    . TRANSTHORACIC ECHOCARDIOGRAM    12/2010,     Social History   Socioeconomic History  . Marital status: Married    Spouse name: None  . Number of children: None  . Years of education: None  . Highest education level: None  Social Needs  . Financial resource strain: None  . Food insecurity - worry: None  . Food insecurity - inability: None  . Transportation needs - medical: None  . Transportation needs - non-medical: None  Occupational History  . None  Tobacco Use  . Smoking status: Never Smoker  . Smokeless tobacco: Never Used  Substance and Sexual Activity  . Alcohol use: No  . Drug use: No  . Sexual activity: No  Other Topics Concern  . None  Social History Narrative   Exercise: 5 days of aerobic, two days of weights    Family History  Problem Relation Age of Onset  . Stroke Mother 24       cns aneurysm bleed X2  . Breast cancer Mother   . Valvular heart disease Mother        valve replacement  . Hypertension Mother   . Cerebral aneurysm Sister 24  . Hypertension Father   . Hypertension Sister   . Diabetes Neg Hx   . Colon cancer Neg Hx     Review of Systems  Constitutional: Positive for chills and fatigue. Negative for  fever.  HENT: Positive for sneezing. Negative for congestion, ear pain, sinus pain and sore throat.   Respiratory: Positive for cough (productive) and wheezing. Negative for shortness of breath.   Gastrointestinal: Negative for diarrhea and nausea.  Musculoskeletal: Negative for myalgias.  Neurological: Positive for headaches. Negative for light-headedness.       Objective:   Vitals:   08/15/17 0905  BP: 132/86  Pulse: 93  Resp: 16  Temp: 98.1 F (36.7 C)  SpO2: 98%   Wt Readings from Last 3 Encounters:  08/15/17 254 lb (115.2 kg)  04/15/17 248 lb 3.2 oz (112.6 kg)  03/14/17 253 lb (114.8 kg)   Body mass index is 31.75 kg/m.   Physical Exam    GENERAL APPEARANCE: Appears stated age, well appearing, NAD EYES: conjunctiva clear, no icterus HEENT:  bilateral tympanic membranes and ear canals normal, oropharynx with mild erythema, no thyromegaly, trachea midline, no cervical or supraclavicular lymphadenopathy LUNGS: Unlabored breathing, good air entry bilaterally, possible slight wheeze right lower lung, no obvious crackles CARDIOVASCULAR: Normal S1,S2 without murmurs, no edema SKIN: Warm, dry    Assessment & Plan:    See Problem List for Assessment and Plan of chronic medical problems.

## 2017-08-15 NOTE — Patient Instructions (Addendum)
Take the antibiotic as prescribed and use the cough medication as needed.    Call if no improvement     Your prescription(s) have been submitted to your pharmacy or been printed and provided for you. Please take as directed and contact our office if you believe you are having problem(s) with the medication(s) or have any questions.  If your symptoms worsen or fail to improve, please contact our office for further instruction, or in case of emergency go directly to the emergency room at the closest medical facility.   General Recommendations:    Please drink plenty of fluids.  Get plenty of rest   Sleep in humidified air  Use saline nasal sprays  Netti pot  OTC Medications:  Decongestants - helps relieve congestion   Flonase (generic fluticasone) or Nasacort (generic triamcinolone) - please make sure to use the "cross-over" technique at a 45 degree angle towards the opposite eye as opposed to straight up the nasal passageway.   Sudafed (generic pseudoephedrine - Note this is the one that is available behind the pharmacy counter); Products with phenylephrine (-PE) may also be used but is often not as effective as pseudoephedrine.   If you have HIGH BLOOD PRESSURE - Coricidin HBP; AVOID any product that is -D as this contains pseudoephedrine which may increase your blood pressure.  Afrin (oxymetazoline) every 6-8 hours for up to 3 days.  Allergies - helps relieve runny nose, itchy eyes and sneezing   Claritin (generic loratidine), Allegra (fexofenidine), or Zyrtec (generic cyrterizine) for runny nose. These medications should not cause drowsiness.  Note - Benadryl (generic diphenhydramine) may be used however may cause drowsiness  Cough -   Delsym or Robitussin (generic dextromethorphan)  Expectorants - helps loosen mucus to ease removal   Mucinex (generic guaifenesin) as directed on the package.  Headaches / General Aches   Tylenol (generic  acetaminophen) - DO NOT EXCEED 3 grams (3,000 mg) in a 24 hour time period  Advil/Motrin (generic ibuprofen)  Sore Throat -   Salt water gargle   Chloraseptic (generic benzocaine) spray or lozenges / Sucrets (generic dyclonine)

## 2017-08-15 NOTE — Assessment & Plan Note (Signed)
Symptoms suggestive of an acute bronchitis We will start doxycycline twice daily times 10 days Tessalon Perles for cough Increase rest and fluids Take other over-the-counter cold medications as needed Call or return if symptoms do not improve

## 2017-08-28 ENCOUNTER — Other Ambulatory Visit: Payer: Self-pay | Admitting: Internal Medicine

## 2017-10-11 ENCOUNTER — Other Ambulatory Visit: Payer: Self-pay | Admitting: Internal Medicine

## 2017-10-15 ENCOUNTER — Telehealth: Payer: Self-pay | Admitting: Emergency Medicine

## 2017-10-15 NOTE — Telephone Encounter (Signed)
Called patient to schedule AWV. Patient declined at this time. 

## 2018-03-19 ENCOUNTER — Telehealth: Payer: Self-pay | Admitting: *Deleted

## 2018-03-19 NOTE — Telephone Encounter (Signed)
Called patient to see if he would wan to have AWV after his PCP appointment 03/20/18, patient will have AWV.

## 2018-03-19 NOTE — Patient Instructions (Addendum)
Have blood work today and in 3-4 weeks.    Tests ordered today. Your results will be released to [place] (or called to you) after review, usually within 72hours after test completion. If any changes need to be made, you will be notified at that same time.  All other Health Maintenance issues reviewed.   All recommended immunizations and age-appropriate screenings are up-to-date or discussed.  Flu immunization administered today.    Medications reviewed and updated.  Changes include :   Starting lamisil  Your prescription(s) have been submitted to your pharmacy. Please take as directed and contact our office if you believe you are having problem(s) with the medication(s).  A referral was ordered for podiatry  Please followup in one year    Health Maintenance, Male A healthy lifestyle and preventive care is important for your health and wellness. Ask your health care provider about what schedule of regular examinations is right for you. What should I know about weight and diet? Eat a Healthy Diet  Eat plenty of vegetables, fruits, whole grains, low-fat dairy products, and lean protein.  Do not eat a lot of foods high in solid fats, added sugars, or salt.  Maintain a Healthy Weight Regular exercise can help you achieve or maintain a healthy weight. You should:  Do at least 150 minutes of exercise each week. The exercise should increase your heart rate and make you sweat (moderate-intensity exercise).  Do strength-training exercises at least twice a week.  Watch Your Levels of Cholesterol and Blood Lipids  Have your blood tested for lipids and cholesterol every 5 years starting at 67 years of age. If you are at high risk for heart disease, you should start having your blood tested when you are 67 years old. You may need to have your cholesterol levels checked more often if: ? Your lipid or cholesterol levels are high. ? You are older than 67 years of age. ? You are at high risk for  heart disease.  What should I know about cancer screening? Many types of cancers can be detected early and may often be prevented. Lung Cancer  You should be screened every year for lung cancer if: ? You are a current smoker who has smoked for at least 30 years. ? You are a former smoker who has quit within the past 15 years.  Talk to your health care provider about your screening options, when you should start screening, and how often you should be screened.  Colorectal Cancer  Routine colorectal cancer screening usually begins at 67 years of age and should be repeated every 5-10 years until you are 67 years old. You may need to be screened more often if early forms of precancerous polyps or small growths are found. Your health care provider may recommend screening at an earlier age if you have risk factors for colon cancer.  Your health care provider may recommend using home test kits to check for hidden blood in the stool.  A small camera at the end of a tube can be used to examine your colon (sigmoidoscopy or colonoscopy). This checks for the earliest forms of colorectal cancer.  Prostate and Testicular Cancer  Depending on your age and overall health, your health care provider may do certain tests to screen for prostate and testicular cancer.  Talk to your health care provider about any symptoms or concerns you have about testicular or prostate cancer.  Skin Cancer  Check your skin from head to toe regularly.  Tell your health care provider about any new moles or changes in moles, especially if: ? There is a change in a mole's size, shape, or color. ? You have a mole that is larger than a pencil eraser.  Always use sunscreen. Apply sunscreen liberally and repeat throughout the day.  Protect yourself by wearing long sleeves, pants, a wide-brimmed hat, and sunglasses when outside.  What should I know about heart disease, diabetes, and high blood pressure?  If you are 70-64  years of age, have your blood pressure checked every 3-5 years. If you are 18 years of age or older, have your blood pressure checked every year. You should have your blood pressure measured twice-once when you are at a hospital or clinic, and once when you are not at a hospital or clinic. Record the average of the two measurements. To check your blood pressure when you are not at a hospital or clinic, you can use: ? An automated blood pressure machine at a pharmacy. ? A home blood pressure monitor.  Talk to your health care provider about your target blood pressure.  If you are between 50-40 years old, ask your health care provider if you should take aspirin to prevent heart disease.  Have regular diabetes screenings by checking your fasting blood sugar level. ? If you are at a normal weight and have a low risk for diabetes, have this test once every three years after the age of 67. ? If you are overweight and have a high risk for diabetes, consider being tested at a younger age or more often.  A one-time screening for abdominal aortic aneurysm (AAA) by ultrasound is recommended for men aged 21-75 years who are current or former smokers. What should I know about preventing infection? Hepatitis B If you have a higher risk for hepatitis B, you should be screened for this virus. Talk with your health care provider to find out if you are at risk for hepatitis B infection. Hepatitis C Blood testing is recommended for:  Everyone born from [birth year] through 1965.  Anyone with known risk factors for hepatitis C.  Sexually Transmitted Diseases (STDs)  You should be screened each year for STDs including gonorrhea and chlamydia if: ? You are sexually active and are younger than 67 years of age. ? You are older than 67 years of age and your health care provider tells you that you are at risk for this type of infection. ? Your sexual activity has changed since you were last screened and you are at an  increased risk for chlamydia or gonorrhea. Ask your health care provider if you are at risk.  Talk with your health care provider about whether you are at high risk of being infected with HIV. Your health care provider may recommend a prescription medicine to help prevent HIV infection.  What else can I do?  Schedule regular health, dental, and eye exams.  Stay current with your vaccines (immunizations).  Do not use any tobacco products, such as cigarettes, chewing tobacco, and e-cigarettes. If you need help quitting, ask your health care provider.  Limit alcohol intake to no more than 2 drinks per day. One drink equals 12 ounces of beer, 5 ounces of wine, or 1 ounces of hard liquor.  Do not use street drugs.  Do not share needles.  Ask your health care provider for help if you need support or information about quitting drugs.  Tell your health care provider if you often feel depressed.  Tell your health care provider if you have ever been abused or do not feel safe at home. This information is not intended to replace advice given to you by your health care provider. Make sure you discuss any questions you have with your health care provider. Document Released: 11/30/2007 Document Revised: 01/31/2016 Document Reviewed: 03/07/2015 Elsevier Interactive Patient Education  [employer].

## 2018-03-19 NOTE — Progress Notes (Signed)
Subjective:    Patient ID: Elijah [name], male    DOB: September 10, 1950, 67 y.o.   MRN: [id number]  HPI He is here for a physical exam.   Numbness in 3-4 toes on right foot.  It has been going for a while - only when he has shoes on.  Not with sneakers, only with confining shoes/dress shoes.  It can feel painful.    Right and left toenail are discolored and thick.  He has tried the otc anti-fungal medication and it has not worked.    He feels well overall.  He denies changes in his health since last year.    Medications and allergies reviewed with patient and updated if appropriate.  Patient Active Problem List   Diagnosis Date Noted  . Adenomatous polyp of colon 11/22/2013  . Atrial fibrillation ([place]) 12/31/2010  . Hyperlipidemia 04/26/2008  . Hypothyroidism 02/19/2007    Current Outpatient Medications on File Prior to Visit  Medication Sig Dispense Refill  . aspirin 81 MG EC tablet Take 162 mg by mouth daily. Reported on 10/06/2015    . Omega-3 Fatty Acids (FISH OIL PO) Take 2 capsules by mouth daily.      No current facility-administered medications on file prior to visit.     Past Medical History:  Diagnosis Date  . Adenomatous colon polyp   . Atrial fibrillation [facility]) 2012   Dr Cristopher Peru seen as OP  . Atrial fibrillation ([place]) 11/22-23/2013   presentation as nausea, dizziness & chest tightness  . Diverticulosis 2007  . Hyperlipidemia   . Thyroid disease    hypothyroidism    Past Surgical History:  Procedure Laterality Date  . CHEST TUBE INSERTION  1982   Spontaneous Pneumothorax  . COLONOSCOPY W/ POLYPECTOMY  2007   Adenomatous Polyp; [facility], Dr Olevia Perches  . Holter monitor-48 hour  03/2011, 12/2010   Dr Crissie Sickles  . TONSILLECTOMY    . TRANSTHORACIC ECHOCARDIOGRAM   12/2010,     Social History   Socioeconomic History  . Marital status: Married    Spouse name: Not on file  . Number of children: 2  . Years of education: Not on file  . Highest  education level: Not on file  Occupational History  . Not on file  Social Needs  . Financial resource strain: Not hard at all  . Food insecurity:    Worry: Never true    Inability: Never true  . Transportation needs:    Medical: No    Non-medical: No  Tobacco Use  . Smoking status: Never Smoker  . Smokeless tobacco: Never Used  Substance and Sexual Activity  . Alcohol use: No  . Drug use: No  . Sexual activity: Never  Lifestyle  . Physical activity:    Days per week: 6 days    Minutes per session: 40 min  . Stress: Only a little  Relationships  . Social connections:    Talks on phone: More than three times a week    Gets together: More than three times a week    Attends religious service: More than 4 times per year    Active member of club or organization: Yes    Attends meetings of clubs or organizations: More than 4 times per year    Relationship status: Married  Other Topics Concern  . Not on file  Social History Narrative   Exercise: 5 days of aerobic, two days of weights    Family History  Problem Relation Age of Onset  . Stroke Mother 56       cns aneurysm bleed X2  . Breast cancer Mother   . Valvular heart disease Mother        valve replacement  . Hypertension Mother   . Cerebral aneurysm Sister 70  . Hypertension Father   . Hypertension Sister   . Diabetes Neg Hx   . Colon cancer Neg Hx     Review of Systems  Constitutional: Negative for chills, fatigue and fever.  Eyes: Negative for visual disturbance.  Respiratory: Negative for cough, shortness of breath and wheezing.   Cardiovascular: Negative for chest pain, palpitations and leg swelling.  Gastrointestinal: Negative for abdominal pain, blood in stool, constipation, diarrhea and nausea.       No gerd  Genitourinary: Negative for dysuria and hematuria.  Musculoskeletal: Negative for arthralgias and back pain.  Skin: Negative for color change and rash.       Toenail discoloration  Neurological:  Positive for numbness. Negative for dizziness, light-headedness and headaches.  Psychiatric/Behavioral: Negative for dysphoric mood. The patient is not nervous/anxious.        Objective:   Vitals:   03/20/18 1433  BP: 124/82  Pulse: 75  Resp: 16  Temp: 97.8 F (36.6 C)  SpO2: 97%   Filed Weights   03/20/18 1433  Weight: 255 lb 6.4 oz (115.8 kg)   Body mass index is 31.92 kg/m.  Wt Readings from Last 3 Encounters:  03/20/18 255 lb (115.7 kg)  03/20/18 255 lb 6.4 oz (115.8 kg)  08/15/17 254 lb (115.2 kg)     Physical Exam Constitutional: He appears well-developed and well-nourished. No distress.  HENT:  Head: Normocephalic and atraumatic.  Right Ear: External ear normal.  Left Ear: External ear normal.  Mouth/Throat: Oropharynx is clear and moist.  Normal ear canals and TM b/l  Eyes: Conjunctivae and EOM are normal.  Neck: Neck supple. No tracheal deviation present. No thyromegaly present.  No carotid bruit  Cardiovascular: Normal rate, regular rhythm, normal heart sounds and intact distal pulses.  No edema No murmur heard. Pulmonary/Chest: Effort normal and breath sounds normal. No respiratory distress. He has no wheezes. He has no rales.  Abdominal: Soft. He exhibits no distension. There is no tenderness.  Genitourinary: deferred  Musculoskeletal: right foot w/o lesions, no pain dorsal or plantar surface  Lymphadenopathy:   He has no cervical adenopathy.  Neuro:  Normal sensation right foot Skin: Skin is warm and dry. He is not diaphoretic. Thickened toenails b/l first toenails.   Psychiatric: He has a normal mood and affect. His behavior is normal.         Assessment & Plan:   Physical exam: Screening blood work  ordered Immunizations  Flu today, pneumovax today, discussed shingrix Colonoscopy   Due this month Eye exams   Up to date  EKG done 03/2017 Exercise  5-6 days a week Weight  BMI higher than ideal - working on keeping weight done Skin    Toenail thickening first toes b/l Substance abuse  none  See Problem List for Assessment and Plan of chronic medical problems.   FU in one year

## 2018-03-19 NOTE — Progress Notes (Addendum)
Subjective:   Elijah [name] is a 67 y.o. male who presents for an Initial Medicare Annual Wellness Visit.  Review of Systems  No ROS.  Medicare Wellness Visit. Additional risk factors are reflected in the social history.  Cardiac Risk Factors include: advanced age (>21men, >74 women);dyslipidemia Sleep patterns: feels rested on waking, gets up 1-2 times nightly to void and sleeps 7 hours nightly.    Home Safety/Smoke Alarms: Feels safe in home. Smoke alarms in place.  Living environment; residence and Firearm Safety: 2-story house, no firearms. Lives with wife, no needs for DME, good support system Seat Belt Safety/Bike Helmet: Wears seat belt.   PSA-  Lab Results  Component Value Date   PSA 2.46 03/14/2017   PSA 1.58 12/25/2015   PSA 1.52 12/23/2014      Objective:    There were no vitals filed for this visit. There is no height or weight on file to calculate BMI.  Advanced Directives 03/20/2018 04/06/2015 03/23/2015 05/09/2012  Does Patient Have a Medical Advance Directive? Yes Yes Yes Patient has advance directive, copy not in chart  Type of Advance Directive [place];Living will - [place];Living will [place];Living will  Does patient want to make changes to medical advance directive? - - No - Patient declined -  Copy of [place] in Chart? No - copy requested - No - copy requested -  Pre-existing out of facility DNR order (yellow form or pink MOST form) - - - No    Current Medications (verified) Outpatient Encounter Medications as of 03/20/2018  Medication Sig  . aspirin 81 MG EC tablet Take 162 mg by mouth daily. Reported on 10/06/2015  . Omega-3 Fatty Acids (FISH OIL PO) Take 2 capsules by mouth daily.   Marland Kitchen SYNTHROID 125 MCG tablet TAKE 1 TABLET(125 MCG) BY MOUTH DAILY BEFORE BREAKFAST  . [DISCONTINUED] amoxicillin (AMOXIL) 500 MG capsule Take 500 mg by mouth 4 (four) times daily.  .  [DISCONTINUED] benzonatate (TESSALON) 200 MG capsule Take 1 capsule (200 mg total) by mouth 3 (three) times daily as needed for cough.  . [DISCONTINUED] doxycycline (VIBRA-TABS) 100 MG tablet Take 1 tablet (100 mg total) by mouth 2 (two) times daily.  . [DISCONTINUED] SYNTHROID 125 MCG tablet TAKE 1 TABLET(125 MCG) BY MOUTH DAILY BEFORE BREAKFAST   No facility-administered encounter medications on file as of 03/20/2018.     Allergies (verified) Patient has no known allergies.   History: Past Medical History:  Diagnosis Date  . Adenomatous colon polyp   . Atrial fibrillation [facility]) 2012   Dr Cristopher Peru seen as OP  . Atrial fibrillation ([place]) 11/22-23/2013   presentation as nausea, dizziness & chest tightness  . Diverticulosis 2007  . Hyperlipidemia   . Thyroid disease    hypothyroidism   Past Surgical History:  Procedure Laterality Date  . CHEST TUBE INSERTION  1982   Spontaneous Pneumothorax  . COLONOSCOPY W/ POLYPECTOMY  2007   Adenomatous Polyp; [facility], Dr Olevia Perches  . Holter monitor-48 hour  03/2011, 12/2010   Dr Crissie Sickles  . TONSILLECTOMY    . TRANSTHORACIC ECHOCARDIOGRAM   12/2010,    Family History  Problem Relation Age of Onset  . Stroke Mother 53       cns aneurysm bleed X2  . Breast cancer Mother   . Valvular heart disease Mother        valve replacement  . Hypertension Mother   .  Cerebral aneurysm Sister 25  . Hypertension Father   . Hypertension Sister   . Diabetes Neg Hx   . Colon cancer Neg Hx    Social History   Socioeconomic History  . Marital status: Married    Spouse name: Not on file  . Number of children: 2  . Years of education: Not on file  . Highest education level: Not on file  Occupational History  . Not on file  Social Needs  . Financial resource strain: Not hard at all  . Food insecurity:    Worry: Never true    Inability: Never true  . Transportation needs:    Medical: No    Non-medical: No  Tobacco Use  . Smoking status:  Never Smoker  . Smokeless tobacco: Never Used  Substance and Sexual Activity  . Alcohol use: No  . Drug use: No  . Sexual activity: Never  Lifestyle  . Physical activity:    Days per week: 6 days    Minutes per session: 40 min  . Stress: Only a little  Relationships  . Social connections:    Talks on phone: More than three times a week    Gets together: More than three times a week    Attends religious service: More than 4 times per year    Active member of club or organization: Yes    Attends meetings of clubs or organizations: More than 4 times per year    Relationship status: Married  Other Topics Concern  . Not on file  Social History Narrative   Exercise: 5 days of aerobic, two days of weights   Tobacco Counseling Counseling given: Not Answered  Activities of Daily Living In your present state of health, do you have any difficulty performing the following activities: 03/20/2018  Hearing? N  Vision? N  Difficulty concentrating or making decisions? N  Walking or climbing stairs? N  Dressing or bathing? N  Doing errands, shopping? N  Preparing Food and eating ? N  Using the Toilet? N  In the past six months, have you accidently leaked urine? N  Do you have problems with loss of bowel control? N  Managing your Medications? N  Managing your Finances? N  Housekeeping or managing your Housekeeping? N  Some recent data might be hidden     Immunizations and Health Maintenance Immunization History  Administered Date(s) Administered  . Influenza Split 05/07/2012  . Influenza, High Dose Seasonal PF 03/14/2017, 03/20/2018  . Influenza-Unspecified 04/24/2016  . Pneumococcal Conjugate-13 03/14/2017  . Tdap 01/15/2005, 12/25/2015  . Zoster 03/31/2012   Health Maintenance Due  Topic Date Due  . INFLUENZA VACCINE  01/15/2018  . PNA vac Low Risk Adult (2 of 2 - PPSV23) 03/14/2018    Patient Care Team: Binnie Rail, MD as PCP - General (Internal Medicine)  Indicate  any recent Medical Services you may have received from other than [facility] providers in the past year (date may be approximate).    Assessment:   This is a routine wellness examination for Elijah [name]. Physical assessment deferred to PCP.   Hearing/Vision screen Hearing Screening Comments: States his hearing has decreased. He will make an appointment to see Dr. Erik Obey. Passed whisper test  Vision Screening Comments: appointment yearly [facility] Dr. Ellie Lunch  Dietary issues and exercise activities discussed: Current Exercise Habits: Home exercise routine, Type of exercise: walking;strength training/weights;calisthenics, Time (Minutes): 40, Frequency (Times/Week): 6, Weekly Exercise (Minutes/Week): 240, Intensity: Mild, Exercise limited by: None identified  Diet (  meal preparation, eat out, water intake, caffeinated beverages, dairy products, fruits and vegetables): in general, a "healthy" diet  , well balanced   Reviewed heart healthy diet. Encouraged patient to increase daily water and healthy fluid intake. Discussed weight loss strategies. Relevant patient education assigned to patient using Emmi.  Goals    . Patient Stated     I would like to lose weight by watching what I eat more closely and reading the weight loss tip sheet that I received.       Depression Screen PHQ 2/9 Scores 03/20/2018 03/14/2017 10/06/2015 09/27/2015  PHQ - 2 Score 0 0 0 0  PHQ- 9 Score 0 - - -    Fall Risk Fall Risk  03/20/2018 03/14/2017 10/06/2015  Falls in the past year? No No No   Cognitive Function:       Ad8 score reviewed for issues:  Issues making decisions: no  Less interest in hobbies / activities: no  Repeats questions, stories (family complaining): no  Trouble using ordinary gadgets (microwave, computer, phone):no  Forgets the month or year: no  Mismanaging finances: no  Remembering appts: no  Daily problems with thinking and/or memory: no Ad8 score is= 0  Screening Tests Health  Maintenance  Topic Date Due  . INFLUENZA VACCINE  01/15/2018  . PNA vac Low Risk Adult (2 of 2 - PPSV23) 03/14/2018  . COLONOSCOPY  04/05/2018  . TETANUS/TDAP  12/24/2025  . Hepatitis C Screening  Completed      Plan:     Continue doing brain stimulating activities (puzzles, reading, adult coloring books, staying active) to keep memory sharp.   Continue to eat heart healthy diet (full of fruits, vegetables, whole grains, lean protein, water--limit salt, fat, and sugar intake) and increase physical activity as tolerated.  I have personally reviewed and noted the following in the patient's chart:   . Medical and social history . Use of alcohol, tobacco or illicit drugs  . Current medications and supplements . Functional ability and status . Nutritional status . Physical activity . Advanced directives . List of other physicians . Vitals . Screenings to include cognitive, depression, and falls . Referrals and appointments  In addition, I have reviewed and discussed with patient certain preventive protocols, quality metrics, and best practice recommendations. A written personalized care plan for preventive services as well as general preventive health recommendations were provided to patient.     Michiel Cowboy, RN   03/20/2018     Medical screening examination/treatment/procedure(s) were performed by non-physician practitioner and as supervising physician I was immediately available for consultation/collaboration. I agree with above. Binnie Rail, MD

## 2018-03-20 ENCOUNTER — Ambulatory Visit (INDEPENDENT_AMBULATORY_CARE_PROVIDER_SITE_OTHER): Payer: Medicare Other | Admitting: *Deleted

## 2018-03-20 ENCOUNTER — Other Ambulatory Visit (INDEPENDENT_AMBULATORY_CARE_PROVIDER_SITE_OTHER): Payer: Medicare Other

## 2018-03-20 ENCOUNTER — Ambulatory Visit (INDEPENDENT_AMBULATORY_CARE_PROVIDER_SITE_OTHER): Payer: Medicare Other | Admitting: Internal Medicine

## 2018-03-20 ENCOUNTER — Encounter: Payer: Self-pay | Admitting: Internal Medicine

## 2018-03-20 VITALS — BP 124/82 | HR 75 | Ht 75.0 in | Wt 255.0 lb

## 2018-03-20 VITALS — BP 124/82 | HR 75 | Temp 97.8°F | Resp 16 | Ht 75.0 in | Wt 255.4 lb

## 2018-03-20 DIAGNOSIS — Z9189 Other specified personal risk factors, not elsewhere classified: Secondary | ICD-10-CM

## 2018-03-20 DIAGNOSIS — Z1211 Encounter for screening for malignant neoplasm of colon: Secondary | ICD-10-CM | POA: Diagnosis not present

## 2018-03-20 DIAGNOSIS — E785 Hyperlipidemia, unspecified: Secondary | ICD-10-CM | POA: Diagnosis not present

## 2018-03-20 DIAGNOSIS — I4891 Unspecified atrial fibrillation: Secondary | ICD-10-CM | POA: Diagnosis not present

## 2018-03-20 DIAGNOSIS — B351 Tinea unguium: Secondary | ICD-10-CM

## 2018-03-20 DIAGNOSIS — Z23 Encounter for immunization: Secondary | ICD-10-CM | POA: Diagnosis not present

## 2018-03-20 DIAGNOSIS — R200 Anesthesia of skin: Secondary | ICD-10-CM

## 2018-03-20 DIAGNOSIS — Z1159 Encounter for screening for other viral diseases: Secondary | ICD-10-CM

## 2018-03-20 DIAGNOSIS — Z0000 Encounter for general adult medical examination without abnormal findings: Secondary | ICD-10-CM

## 2018-03-20 DIAGNOSIS — E039 Hypothyroidism, unspecified: Secondary | ICD-10-CM

## 2018-03-20 LAB — CBC WITH DIFFERENTIAL/PLATELET
BASOS ABS: 0 10*3/uL (ref 0.0–0.1)
Basophils Relative: 0.6 % (ref 0.0–3.0)
EOS ABS: 0.1 10*3/uL (ref 0.0–0.7)
Eosinophils Relative: 2.1 % (ref 0.0–5.0)
HEMATOCRIT: 47.3 % (ref 39.0–52.0)
HEMOGLOBIN: 15.9 g/dL (ref 13.0–17.0)
LYMPHS PCT: 42.1 % (ref 12.0–46.0)
Lymphs Abs: 2.8 10*3/uL (ref 0.7–4.0)
MCHC: 33.7 g/dL (ref 30.0–36.0)
MCV: 90.1 fl (ref 78.0–100.0)
Monocytes Absolute: 0.7 10*3/uL (ref 0.1–1.0)
Monocytes Relative: 10.5 % (ref 3.0–12.0)
NEUTROS ABS: 3 10*3/uL (ref 1.4–7.7)
Neutrophils Relative %: 44.7 % (ref 43.0–77.0)
Platelets: 181 10*3/uL (ref 150.0–400.0)
RBC: 5.24 Mil/uL (ref 4.22–5.81)
RDW: 13.2 % (ref 11.5–15.5)
WBC: 6.6 10*3/uL (ref 4.0–10.5)

## 2018-03-20 LAB — LIPID PANEL
CHOL/HDL RATIO: 5
CHOLESTEROL: 167 mg/dL (ref 0–200)
HDL: 36.4 mg/dL — AB (ref >39.00)
LDL Cholesterol: 101 mg/dL — ABNORMAL HIGH (ref 0–99)
NonHDL: 130.79
TRIGLYCERIDES: 149 mg/dL (ref 0.0–149.0)
VLDL: 29.8 mg/dL (ref 0.0–40.0)

## 2018-03-20 LAB — COMPREHENSIVE METABOLIC PANEL
ALBUMIN: 4.1 g/dL (ref 3.5–5.2)
ALK PHOS: 69 U/L (ref 39–117)
ALT: 11 U/L (ref 0–53)
AST: 27 U/L (ref 0–37)
BILIRUBIN TOTAL: 0.6 mg/dL (ref 0.2–1.2)
BUN: 20 mg/dL (ref 6–23)
CALCIUM: 9.1 mg/dL (ref 8.4–10.5)
CO2: 30 meq/L (ref 19–32)
CREATININE: 1.22 mg/dL (ref 0.40–1.50)
Chloride: 105 mEq/L (ref 96–112)
GFR: 63.02 mL/min (ref >60.00)
Glucose, Bld: 64 mg/dL — ABNORMAL LOW (ref 70–99)
Potassium: 4.2 mEq/L (ref 3.5–5.1)
Sodium: 139 mEq/L (ref 135–145)
TOTAL PROTEIN: 7.4 g/dL (ref 6.0–8.3)

## 2018-03-20 LAB — TSH: TSH: 2.63 u[IU]/mL (ref 0.35–4.50)

## 2018-03-20 MED ORDER — SYNTHROID 125 MCG PO TABS: ORAL_TABLET | ORAL | 3 refills | Status: DC

## 2018-03-20 MED ORDER — TERBINAFINE HCL 250 MG PO TABS: 250.0000 mg | ORAL_TABLET | Freq: Every day | ORAL | 0 refills | Status: DC

## 2018-03-20 NOTE — Patient Instructions (Addendum)
Continue doing brain stimulating activities (puzzles, reading, adult coloring books, staying active) to keep memory sharp.   Continue to eat heart healthy diet (full of fruits, vegetables, whole grains, lean protein, water--limit salt, fat, and sugar intake) and increase physical activity as tolerated.   [name] , Thank you for taking time to come for your Medicare Wellness Visit. I appreciate your ongoing commitment to your health goals. Please review the following plan we discussed and let me know if I can assist you in the future.   These are the goals we discussed: Goals    . Patient Stated     I would like to lose weight by watching what I eat more closely and reading the weight loss tip sheet that I received.        This is a list of the screening recommended for you and due dates:  Health Maintenance  Topic Date Due  . Flu Shot  01/15/2018  . Pneumonia vaccines (2 of 2 - PPSV23) 03/14/2018  . Colon Cancer Screening  04/05/2018  . Tetanus Vaccine  12/24/2025  .  Hepatitis C: One time screening is recommended by Center for Disease Control  (CDC) for  adults born from [birth year] through 1965.   Completed

## 2018-03-21 DIAGNOSIS — B351 Tinea unguium: Secondary | ICD-10-CM | POA: Insufficient documentation

## 2018-03-21 DIAGNOSIS — R200 Anesthesia of skin: Secondary | ICD-10-CM | POA: Insufficient documentation

## 2018-03-21 NOTE — Assessment & Plan Note (Signed)
numbness in right 3-4 toes - only with dress or constricting shoes Refer to podiatry

## 2018-03-21 NOTE — Assessment & Plan Note (Signed)
Check lipid panel  Regular exercise and healthy diet encouraged  

## 2018-03-21 NOTE — Assessment & Plan Note (Signed)
B/l first toes Discussed treatment options - topical has not worked in the past lamisil orally - lfts today and again in 3-4 weeks

## 2018-03-21 NOTE — Assessment & Plan Note (Signed)
Intermittent Afib - has intermittent palpitations - does not feel them, but his apple watch detects it -- asymptomatic On ASA 81 mg  Sees cardiology annually

## 2018-03-21 NOTE — Assessment & Plan Note (Signed)
Clinically euthyroid Check tsh  Titrate med dose if needed  

## 2018-03-23 LAB — PSA, TOTAL AND FREE
PSA, % Free: 17 % (calc) — ABNORMAL LOW (ref >25)
PSA, Free: 0.3 ng/mL
PSA, TOTAL: 1.8 ng/mL (ref <=4.0)

## 2018-03-23 LAB — HEPATITIS C ANTIBODY
Hepatitis C Ab: NONREACTIVE
SIGNAL TO CUT-OFF: 0.02 (ref <1.00)

## 2018-03-24 ENCOUNTER — Encounter: Payer: Self-pay | Admitting: Internal Medicine

## 2018-04-11 ENCOUNTER — Other Ambulatory Visit: Payer: Self-pay | Admitting: Family Medicine

## 2018-04-11 ENCOUNTER — Ambulatory Visit (INDEPENDENT_AMBULATORY_CARE_PROVIDER_SITE_OTHER): Payer: Medicare Other | Admitting: Family Medicine

## 2018-04-11 ENCOUNTER — Encounter: Payer: Self-pay | Admitting: Family Medicine

## 2018-04-11 VITALS — BP 124/82 | HR 94 | Temp 97.8°F | Wt 251.0 lb

## 2018-04-11 DIAGNOSIS — J0100 Acute maxillary sinusitis, unspecified: Secondary | ICD-10-CM

## 2018-04-11 MED ORDER — FLUTICASONE PROPIONATE 50 MCG/ACT NA SUSP: 2.0000 | Freq: Every day | NASAL | 1 refills | Status: DC

## 2018-04-11 MED ORDER — CETIRIZINE HCL 10 MG PO TABS: 10.0000 mg | ORAL_TABLET | Freq: Every day | ORAL | 1 refills | Status: DC

## 2018-04-11 NOTE — Progress Notes (Signed)
   Subjective:    Patient ID: Elijah [name], male    DOB: 18-Jun-1950, 67 y.o.   MRN: [id number]  HPI Cough- sxs started ~3 weeks ago.  Cough is now productive.  + HAs.  No fevers.  + sinus pressure.  No ear pain.  No tooth pain.  No N/V.  + sick contacts.  Has used Excedrin for HA and cough drops.  No hx of seasonal allergies.     Review of Systems For ROS see HPI     Objective:   Physical Exam  Constitutional: He appears well-developed and well-nourished. No distress.  HENT:  Head: Normocephalic and atraumatic.  Mild TTP over maxillary sinuses, no TTP over frontal sinuses + turbinate edema + PND TMs normal bilaterally  Eyes: Pupils are equal, round, and reactive to light. Conjunctivae and EOM are normal.  Neck: Normal range of motion. Neck supple.  Cardiovascular: Normal rate, regular rhythm and normal heart sounds.  Pulmonary/Chest: Effort normal and breath sounds normal. No respiratory distress. He has no wheezes.  Lymphadenopathy:    He has no cervical adenopathy.  Skin: Skin is warm and dry.          Assessment & Plan:  Sinus inflammation- new.  Pt w/ mild TTP over maxillary sinuses, copious PND, and dry cough in office.  No evidence of bacterial infxn but suspect viral sinus inflammation.  Given his hx of Afib, will not start prednisone but will add Flonase, Zyrtec to improve sxs.  Reviewed supportive care and red flags that should prompt return.  Pt expressed understanding and is in agreement w/ plan.

## 2018-04-11 NOTE — Patient Instructions (Signed)
Follow up as needed or as scheduled This appears to be sinus inflammation rather than infection START a once daily Zyrtec (Cetirizine) to improve congestion USE nasal steroid 2 sprays each nostril once daily until congestion and cough improve DRINK plenty of fluids ADD Mucinex to keep congestion thin and moving Tylenol as needed for headache Call with any questions or concerns Hang in there!

## 2018-04-13 ENCOUNTER — Encounter: Payer: Self-pay | Admitting: Gastroenterology

## 2018-04-16 ENCOUNTER — Encounter: Payer: Self-pay | Admitting: Internal Medicine

## 2018-05-06 ENCOUNTER — Other Ambulatory Visit: Payer: Self-pay | Admitting: Family Medicine

## 2018-05-20 ENCOUNTER — Ambulatory Visit: Payer: Medicare Other | Admitting: Internal Medicine

## 2018-06-13 ENCOUNTER — Other Ambulatory Visit: Payer: Self-pay | Admitting: Internal Medicine

## 2018-06-15 ENCOUNTER — Encounter: Payer: Self-pay | Admitting: Gastroenterology

## 2018-07-03 ENCOUNTER — Ambulatory Visit (AMBULATORY_SURGERY_CENTER): Payer: Self-pay

## 2018-07-03 ENCOUNTER — Other Ambulatory Visit: Payer: Self-pay

## 2018-07-03 ENCOUNTER — Telehealth: Payer: Self-pay

## 2018-07-03 VITALS — Ht 75.0 in | Wt 251.2 lb

## 2018-07-03 DIAGNOSIS — Z86010 Personal history of colonic polyps: Secondary | ICD-10-CM

## 2018-07-03 MED ORDER — NA SULFATE-K SULFATE-MG SULF 17.5-3.13-1.6 GM/177ML PO SOLN: 1.0000 | Freq: Once | ORAL | 0 refills | Status: AC

## 2018-07-03 NOTE — Telephone Encounter (Signed)
Patient came in today for a previsit. Has a history of A fib. Pt verbalized about 10 years ago during a regular check he was diagnosed with afib. He was referred to a cardiologist, pt has never had any symptoms and does not take any medications for this. He follows up yearly with a cardiologist. Pt is due to see cardio doctor the day after his procedure.  Does he need to reschedule his colonoscopy until after he follows up with cardio. Cardiologist Cristopher Peru  MD  Please advise. Clent Ridges RN previsit.

## 2018-07-03 NOTE — Telephone Encounter (Signed)
I think okay to proceed if he is asymptomatic. Reviewed prior cardiology notes, he has not required therapy. Elijah [name] let me know if you think differently, thanks.

## 2018-07-03 NOTE — Progress Notes (Signed)
No egg or soy allergy known to patient  No issues with past sedation with any surgeries  or procedures, no intubation problems  No diet pills per patient No home 02 use per patient  No blood thinners per patient  Pt denies issues with constipation  Hx of  A fib over 10 years ago, pt verbalize never had symptoms , follows up yearly at cardiologist.  EMMI pt declined

## 2018-07-05 NOTE — Telephone Encounter (Signed)
Dr. Carlean Purl,  It would be best to wait 6-8 weeks post biopsy for his elective procedure.  Thanks,  Osvaldo Angst

## 2018-07-06 ENCOUNTER — Encounter: Payer: Self-pay | Admitting: Gastroenterology

## 2018-07-16 ENCOUNTER — Encounter: Payer: Medicare Other | Admitting: Gastroenterology

## 2018-07-16 NOTE — Progress Notes (Signed)
Subjective:    Patient ID: Elijah [name], male    DOB: [birth year], 68 y.o.   MRN: [id number]  HPI He is here for an acute visit for cold symptoms.  His symptoms started 3 weeks ago.  He is experiencing a primarily dry cough that just will not stop.  It did start with other cold symptoms that have improved.  Currently he denies fever, shortness of breath and wheezing.  He does have some headaches with the cough.  The cough is worse at night and he is having difficulty sleeping.  He denies any drainage that he is aware of or GERD.  He does feel little fatigued.  He has been exposed to pneumonia.  He wears an apple watch and it has been alerting him several times a day that he has an irregular rhythm that is suggestive of atrial fibrillation.  He does have a history of atrial fibrillation and is asymptomatic.  He takes a baby aspirin daily.  He has not had any symptoms, including chest pain, palpitations, shortness of breath, changes in energy level or stamina, lightheadedness.  He has an appointment with Dr. Lovena Le next Tuesday.   Medications and allergies reviewed with patient and updated if appropriate.  Patient Active Problem List   Diagnosis Date Noted  . Onychomycosis of great toe 03/21/2018  . Numbness 03/21/2018  . Adenomatous polyp of colon 11/22/2013  . Atrial fibrillation ([place]) 12/31/2010  . Hyperlipidemia 04/26/2008  . Hypothyroidism 02/19/2007    Current Outpatient Medications on File Prior to Visit  Medication Sig Dispense Refill  . aspirin 81 MG EC tablet Take 162 mg by mouth daily. Reported on 10/06/2015    . Omega-3 Fatty Acids (FISH OIL PO) Take 2 capsules by mouth daily.     Marland Kitchen SYNTHROID 125 MCG tablet TAKE 1 TABLET(125 MCG) BY MOUTH DAILY BEFORE BREAKFAST 90 tablet 3   No current facility-administered medications on file prior to visit.     Past Medical History:  Diagnosis Date  . Adenomatous colon polyp   . Atrial fibrillation [facility]) 2012   Dr Cristopher Peru  seen as OP  . Atrial fibrillation ([place]) 11/22-23/2013   presentation as nausea, dizziness & chest tightness  . Diverticulosis 2007  . Hyperlipidemia   . Thyroid disease    hypothyroidism    Past Surgical History:  Procedure Laterality Date  . CHEST TUBE INSERTION  1982   Spontaneous Pneumothorax  . COLONOSCOPY    . COLONOSCOPY W/ POLYPECTOMY  2007   Adenomatous Polyp; [facility], Dr Olevia Perches  . Holter monitor-48 hour  03/2011, 12/2010   Dr Crissie Sickles  . TONSILLECTOMY    . TRANSTHORACIC ECHOCARDIOGRAM   12/2010,     Social History   Socioeconomic History  . Marital status: Married    Spouse name: Not on file  . Number of children: 2  . Years of education: Not on file  . Highest education level: Not on file  Occupational History  . Not on file  Social Needs  . Financial resource strain: Not hard at all  . Food insecurity:    Worry: Never true    Inability: Never true  . Transportation needs:    Medical: No    Non-medical: No  Tobacco Use  . Smoking status: Never Smoker  . Smokeless tobacco: Never Used  Substance and Sexual Activity  . Alcohol use: No  . Drug use: No  . Sexual activity: Never  Lifestyle  . Physical activity:  Days per week: 6 days    Minutes per session: 40 min  . Stress: Only a little  Relationships  . Social connections:    Talks on phone: More than three times a week    Gets together: More than three times a week    Attends religious service: More than 4 times per year    Active member of club or organization: Yes    Attends meetings of clubs or organizations: More than 4 times per year    Relationship status: Married  Other Topics Concern  . Not on file  Social History Narrative   Exercise: 5 days of aerobic, two days of weights    Family History  Problem Relation Age of Onset  . Stroke Mother 19       cns aneurysm bleed X2  . Breast cancer Mother   . Valvular heart disease Mother        valve replacement  . Hypertension  Mother   . Cerebral aneurysm Sister 101  . Hypertension Father   . Hypertension Sister   . Diabetes Neg Hx   . Colon cancer Neg Hx   . Rectal cancer Neg Hx   . Stomach cancer Neg Hx     Review of Systems  Constitutional: Negative for chills and fever.  HENT: Negative for congestion, ear pain, postnasal drip, sinus pain and sore throat.   Respiratory: Positive for cough. Negative for shortness of breath and wheezing.   Cardiovascular: Negative for chest pain and palpitations.  Gastrointestinal: Negative for diarrhea and nausea.       No gerd  Musculoskeletal: Negative for myalgias.  Neurological: Positive for headaches. Negative for light-headedness.       Objective:   Vitals:   07/17/18 1000  BP: 124/80  Pulse: 92  Resp: 16  Temp: 97.6 F (36.4 C)  SpO2: 97%   Filed Weights   07/17/18 1000  Weight: 253 lb (114.8 kg)   Body mass index is 31.62 kg/m.  Wt Readings from Last 3 Encounters:  07/17/18 253 lb (114.8 kg)  07/03/18 251 lb 3.2 oz (113.9 kg)  04/11/18 251 lb (113.9 kg)     Physical Exam GENERAL APPEARANCE: Appears stated age, well appearing, NAD EYES: conjunctiva clear, no icterus HEENT: bilateral tympanic membranes and ear canals normal, oropharynx with no erythema, no thyromegaly, trachea midline, no cervical or supraclavicular lymphadenopathy LUNGS: Clear to auscultation without wheeze or crackles, unlabored breathing, good air entry bilaterally CARDIOVASCULAR: Normal S1,S2 without murmurs, no edema, manually felt pulse for at least 1 minute-rate and rhythm was very normal SKIN: warm, dry        Assessment & Plan:   See Problem List for Assessment and Plan of chronic medical problems.

## 2018-07-17 ENCOUNTER — Ambulatory Visit: Payer: Medicare Other | Admitting: Internal Medicine

## 2018-07-17 ENCOUNTER — Encounter: Payer: Self-pay | Admitting: Internal Medicine

## 2018-07-17 VITALS — BP 124/80 | HR 92 | Temp 97.6°F | Resp 16 | Ht 75.0 in | Wt 253.0 lb

## 2018-07-17 DIAGNOSIS — R059 Cough, unspecified: Secondary | ICD-10-CM | POA: Insufficient documentation

## 2018-07-17 DIAGNOSIS — R05 Cough: Secondary | ICD-10-CM | POA: Diagnosis not present

## 2018-07-17 DIAGNOSIS — I4891 Unspecified atrial fibrillation: Secondary | ICD-10-CM | POA: Diagnosis not present

## 2018-07-17 MED ORDER — HYDROCODONE-HOMATROPINE 5-1.5 MG/5ML PO SYRP: 5.0000 mL | ORAL_SOLUTION | Freq: Three times a day (TID) | ORAL | 0 refills | Status: DC | PRN

## 2018-07-17 MED ORDER — AZITHROMYCIN 250 MG PO TABS: ORAL_TABLET | ORAL | 0 refills | Status: DC

## 2018-07-17 NOTE — Assessment & Plan Note (Signed)
His watch has been alerting him that most likely he has had atrial fibrillation He is completely asymptomatic Taking baby aspirin daily Today appears to be in normal sinus rhythm Sees Dr. Lovena Le on Tuesday so we will hold off on an EKG and any further evaluation

## 2018-07-17 NOTE — Patient Instructions (Signed)
Take the antibiotic as prescribed - complete the entire course.  Use the cough syrup as needed.  Continue over the counter cold medication, advil and tylenol.  Increase your fluids and rest.    Call if no improvement    

## 2018-07-17 NOTE — Assessment & Plan Note (Signed)
Given duration of 3 weeks we will go ahead and treat with an antibiotic for possible atypical infection He has had close contact with 2 individuals in the past pneumonia Azithromycin Hycodan cough syrup at night Call if no improvement

## 2018-07-20 ENCOUNTER — Encounter: Payer: Medicare Other | Admitting: Gastroenterology

## 2018-07-21 ENCOUNTER — Encounter: Payer: Self-pay | Admitting: Internal Medicine

## 2018-07-21 ENCOUNTER — Ambulatory Visit: Payer: Medicare Other | Admitting: Internal Medicine

## 2018-07-21 VITALS — BP 116/80 | HR 90 | Ht 75.0 in | Wt 252.0 lb

## 2018-07-21 DIAGNOSIS — I482 Chronic atrial fibrillation, unspecified: Secondary | ICD-10-CM

## 2018-07-21 NOTE — Patient Instructions (Signed)

## 2018-07-21 NOTE — Progress Notes (Signed)
HPI Elijah [name] returns today for ongoing followup of atrial fib. He is a pleasant 68 yo man with a h/o chronic atrial fib. He does not feel his atrial fib. He is active and has class 1 symptoms. He denies chest pain or sob. No edema. He is exercising 2-3 hours a week. No limitation. He was instructed to keep a log of his blood pressures. He thinks that his pressures have been controlled. No other complaints today.   No Known Allergies   Current Outpatient Medications  Medication Sig Dispense Refill  . aspirin 81 MG EC tablet Take 162 mg by mouth daily. Reported on 10/06/2015    . Omega-3 Fatty Acids (FISH OIL PO) Take 2 capsules by mouth daily.     Marland Kitchen SYNTHROID 125 MCG tablet TAKE 1 TABLET(125 MCG) BY MOUTH DAILY BEFORE BREAKFAST 90 tablet 3  . HYDROcodone-homatropine (HYCODAN) 5-1.5 MG/5ML syrup Take 5 mLs by mouth every 8 (eight) hours as needed for cough. (Patient not taking: Reported on 07/21/2018) 120 mL 0   No current facility-administered medications for this visit.      Past Medical History:  Diagnosis Date  . Adenomatous colon polyp   . Atrial fibrillation [facility]) 2012   Dr Cristopher Peru seen as OP  . Atrial fibrillation ([place]) 11/22-23/2013   presentation as nausea, dizziness & chest tightness  . Diverticulosis 2007  . Hyperlipidemia   . Thyroid disease    hypothyroidism    ROS:   All systems reviewed and negative except as noted in the HPI.   Past Surgical History:  Procedure Laterality Date  . CHEST TUBE INSERTION  1982   Spontaneous Pneumothorax  . COLONOSCOPY    . COLONOSCOPY W/ POLYPECTOMY  2007   Adenomatous Polyp; [facility], Dr Olevia Perches  . Holter monitor-48 hour  03/2011, 12/2010   Dr Crissie Sickles  . TONSILLECTOMY    . TRANSTHORACIC ECHOCARDIOGRAM   12/2010,      Family History  Problem Relation Age of Onset  . Stroke Mother 96       cns aneurysm bleed X2  . Breast cancer Mother   . Valvular heart disease Mother        valve replacement  .  Hypertension Mother   . Cerebral aneurysm Sister 52  . Hypertension Father   . Hypertension Sister   . Diabetes Neg Hx   . Colon cancer Neg Hx   . Rectal cancer Neg Hx   . Stomach cancer Neg Hx      Social History   Socioeconomic History  . Marital status: Married    Spouse name: Not on file  . Number of children: 2  . Years of education: Not on file  . Highest education level: Not on file  Occupational History  . Not on file  Social Needs  . Financial resource strain: Not hard at all  . Food insecurity:    Worry: Never true    Inability: Never true  . Transportation needs:    Medical: No    Non-medical: No  Tobacco Use  . Smoking status: Never Smoker  . Smokeless tobacco: Never Used  Substance and Sexual Activity  . Alcohol use: No  . Drug use: No  . Sexual activity: Never  Lifestyle  . Physical activity:    Days per week: 6 days    Minutes per session: 40 min  . Stress: Only a little  Relationships  . Social connections:    Talks on  phone: More than three times a week    Gets together: More than three times a week    Attends religious service: More than 4 times per year    Active member of club or organization: Yes    Attends meetings of clubs or organizations: More than 4 times per year    Relationship status: Married  . Intimate partner violence:    Fear of current or ex partner: No    Emotionally abused: No    Physically abused: No    Forced sexual activity: No  Other Topics Concern  . Not on file  Social History Narrative   Exercise: 5 days of aerobic, two days of weights     BP 116/80   Pulse 90   Ht 6\' 3"  (1.905 m)   Wt 252 lb (114.3 kg)   BMI 31.50 kg/m   Physical Exam:  Well appearing NAD HEENT: Unremarkable Neck:  No JVD, no thyromegally Lymphatics:  No adenopathy Back:  No CVA tenderness Lungs:  Clear with no wheezes HEART:  IRegular rate rhythm, no murmurs, no rubs, no clicks Abd:  soft, positive bowel sounds, no organomegally,  no rebound, no guarding Ext:  2 plus pulses, no edema, no cyanosis, no clubbing Skin:  No rashes no nodules Neuro:  CN II through XII intact, motor grossly intact  EKG - atrial fib with a controlled VR   Assess/Plan: 1. Atrial fib - his CHADSVASC score is 1. He will continue ASA. His VR is well controlled.  2. Thyroid dysfunction - he will continue synthroid. He is clinically euvolemic.  Elijah [name].D.

## 2018-12-10 ENCOUNTER — Other Ambulatory Visit: Payer: Self-pay | Admitting: Internal Medicine

## 2019-03-10 ENCOUNTER — Other Ambulatory Visit: Payer: Self-pay | Admitting: Internal Medicine

## 2019-03-25 NOTE — Patient Instructions (Addendum)
Tests ordered today. Your results will be released to MyChart (or called to you) after review.  If any changes need to be made, you will be notified at that same time.  All other Health Maintenance issues reviewed.   All recommended immunizations and age-appropriate screenings are up-to-date or discussed.  No immunization administered today.   Medications reviewed and updated.  Changes include :   none    Please followup in 1 year    Health Maintenance, Male Adopting a healthy lifestyle and getting preventive care are important in promoting health and wellness. Ask your health care provider about:  The right schedule for you to have regular tests and exams.  Things you can do on your own to prevent diseases and keep yourself healthy. What should I know about diet, weight, and exercise? Eat a healthy diet   Eat a diet that includes plenty of vegetables, fruits, low-fat dairy products, and lean protein.  Do not eat a lot of foods that are high in solid fats, added sugars, or sodium. Maintain a healthy weight Body mass index (BMI) is a measurement that can be used to identify possible weight problems. It estimates body fat based on height and weight. Your health care provider can help determine your BMI and help you achieve or maintain a healthy weight. Get regular exercise Get regular exercise. This is one of the most important things you can do for your health. Most adults should:  Exercise for at least 150 minutes each week. The exercise should increase your heart rate and make you sweat (moderate-intensity exercise).  Do strengthening exercises at least twice a week. This is in addition to the moderate-intensity exercise.  Spend less time sitting. Even light physical activity can be beneficial. Watch cholesterol and blood lipids Have your blood tested for lipids and cholesterol at 68 years of age, then have this test every 5 years. You may need to have your cholesterol  levels checked more often if:  Your lipid or cholesterol levels are high.  You are older than 68 years of age.  You are at high risk for heart disease. What should I know about cancer screening? Many types of cancers can be detected early and may often be prevented. Depending on your health history and family history, you may need to have cancer screening at various ages. This may include screening for:  Colorectal cancer.  Prostate cancer.  Skin cancer.  Lung cancer. What should I know about heart disease, diabetes, and high blood pressure? Blood pressure and heart disease  High blood pressure causes heart disease and increases the risk of stroke. This is more likely to develop in people who have high blood pressure readings, are of African descent, or are overweight.  Talk with your health care provider about your target blood pressure readings.  Have your blood pressure checked: ? Every 3-5 years if you are 18-39 years of age. ? Every year if you are 40 years old or older.  If you are between the ages of 65 and 75 and are a current or former smoker, ask your health care provider if you should have a one-time screening for abdominal aortic aneurysm (AAA). Diabetes Have regular diabetes screenings. This checks your fasting blood sugar level. Have the screening done:  Once every three years after age 45 if you are at a normal weight and have a low risk for diabetes.  More often and at a younger age if you are overweight or have a high   risk for diabetes. What should I know about preventing infection? Hepatitis B If you have a higher risk for hepatitis B, you should be screened for this virus. Talk with your health care provider to find out if you are at risk for hepatitis B infection. Hepatitis C Blood testing is recommended for:  Everyone born from 1945 through 1965.  Anyone with known risk factors for hepatitis C. Sexually transmitted infections (STIs)  You should be  screened each year for STIs, including gonorrhea and chlamydia, if: ? You are sexually active and are younger than 68 years of age. ? You are older than 68 years of age and your health care provider tells you that you are at risk for this type of infection. ? Your sexual activity has changed since you were last screened, and you are at increased risk for chlamydia or gonorrhea. Ask your health care provider if you are at risk.  Ask your health care provider about whether you are at high risk for HIV. Your health care provider may recommend a prescription medicine to help prevent HIV infection. If you choose to take medicine to prevent HIV, you should first get tested for HIV. You should then be tested every 3 months for as long as you are taking the medicine. Follow these instructions at home: Lifestyle  Do not use any products that contain nicotine or tobacco, such as cigarettes, e-cigarettes, and chewing tobacco. If you need help quitting, ask your health care provider.  Do not use street drugs.  Do not share needles.  Ask your health care provider for help if you need support or information about quitting drugs. Alcohol use  Do not drink alcohol if your health care provider tells you not to drink.  If you drink alcohol: ? Limit how much you have to 0-2 drinks a day. ? Be aware of how much alcohol is in your drink. In the [place] one drink equals one 12 oz bottle of beer (355 mL), one 5 oz glass of wine (148 mL), or one 1 oz glass of hard liquor (44 mL). General instructions  Schedule regular health, dental, and eye exams.  Stay current with your vaccines.  Tell your health care provider if: ? You often feel depressed. ? You have ever been abused or do not feel safe at home. Summary  Adopting a healthy lifestyle and getting preventive care are important in promoting health and wellness.  Follow your health care provider's instructions about healthy diet, exercising, and getting  tested or screened for diseases.  Follow your health care provider's instructions on monitoring your cholesterol and blood pressure. This information is not intended to replace advice given to you by your health care provider. Make sure you discuss any questions you have with your health care provider. Document Released: 11/30/2007 Document Revised: 05/27/2018 Document Reviewed: 05/27/2018 Elsevier Patient Education  2020 Elsevier Inc.  

## 2019-03-25 NOTE — Progress Notes (Addendum)
Subjective:   Elijah [name] is a 68 y.o. male who presents for Medicare Annual/Subsequent preventive examination.  Review of Systems:   Cardiac Risk Factors include: advanced age (>60men, >73 women);male gender Sleep patterns: feels rested on waking, gets up 3 times nightly to void and sleeps 7-8 hours nightly.    Home Safety/Smoke Alarms: Feels safe in home. Smoke alarms in place.  Living environment; residence and Firearm Safety: 1-story house/ trailer. Lives with wife, no needs for DME, good support system     Objective:    Vitals: There were no vitals taken for this visit.  There is no height or weight on file to calculate BMI.  Advanced Directives 03/26/2019 03/20/2018 04/06/2015 03/23/2015 05/09/2012  Does Patient Have a Medical Advance Directive? Yes Yes Yes Yes Patient has advance directive, copy not in chart  Type of Advance Directive [place];Living will [place];Living will - [place];Living will [place];Living will  Does patient want to make changes to medical advance directive? - - - No - Patient declined -  Copy of [place] in Chart? No - copy requested No - copy requested - No - copy requested -  Pre-existing out of facility DNR order (yellow form or pink MOST form) - - - - No    Tobacco Social History   Tobacco Use  Smoking Status Never Smoker  Smokeless Tobacco Never Used     Counseling given: Not Answered   Past Medical History:  Diagnosis Date  . Adenomatous colon polyp   . Atrial fibrillation [facility]) 2012   Dr Cristopher Peru seen as OP  . Atrial fibrillation ([place]) 11/22-23/2013   presentation as nausea, dizziness & chest tightness  . Diverticulosis 2007  . Hyperlipidemia   . Thyroid disease    hypothyroidism   Past Surgical History:  Procedure Laterality Date  . CHEST TUBE INSERTION  1982   Spontaneous Pneumothorax  . COLONOSCOPY    . COLONOSCOPY  W/ POLYPECTOMY  2007   Adenomatous Polyp; [facility], Dr Olevia Perches  . Holter monitor-48 hour  03/2011, 12/2010   Dr Crissie Sickles  . TONSILLECTOMY    . TRANSTHORACIC ECHOCARDIOGRAM   12/2010,    Family History  Problem Relation Age of Onset  . Stroke Mother 4       cns aneurysm bleed X2  . Breast cancer Mother   . Valvular heart disease Mother        valve replacement  . Hypertension Mother   . Cerebral aneurysm Sister 63  . Hypertension Father   . Hypertension Sister   . Diabetes Neg Hx   . Colon cancer Neg Hx   . Rectal cancer Neg Hx   . Stomach cancer Neg Hx    Social History   Socioeconomic History  . Marital status: Married    Spouse name: Not on file  . Number of children: 2  . Years of education: Not on file  . Highest education level: Not on file  Occupational History  . Not on file  Social Needs  . Financial resource strain: Not hard at all  . Food insecurity    Worry: Never true    Inability: Never true  . Transportation needs    Medical: No    Non-medical: No  Tobacco Use  . Smoking status: Never Smoker  . Smokeless tobacco: Never Used  Substance and Sexual Activity  . Alcohol use: No  . Drug use:  No  . Sexual activity: Never  Lifestyle  . Physical activity    Days per week: 6 days    Minutes per session: 40 min  . Stress: Only a little  Relationships  . Social connections    Talks on phone: More than three times a week    Gets together: More than three times a week    Attends religious service: More than 4 times per year    Active member of club or organization: Yes    Attends meetings of clubs or organizations: More than 4 times per year    Relationship status: Married  Other Topics Concern  . Not on file  Social History Narrative   Exercise: 5 days of aerobic, two days of weights    Outpatient Encounter Medications as of 03/26/2019  Medication Sig  . aspirin 81 MG EC tablet Take 162 mg by mouth daily. Reported on 10/06/2015  . Omega-3 Fatty  Acids (FISH OIL PO) Take 2 capsules by mouth daily.   . sildenafil (VIAGRA) 100 MG tablet TK 1 T PO PRN  . SYNTHROID 125 MCG tablet TAKE 1 TABLET(125 MCG) BY MOUTH DAILY BEFORE BREAKFAST  . [DISCONTINUED] HYDROcodone-homatropine (HYCODAN) 5-1.5 MG/5ML syrup Take 5 mLs by mouth every 8 (eight) hours as needed for cough. (Patient not taking: Reported on 07/21/2018)   No facility-administered encounter medications on file as of 03/26/2019.     Activities of Daily Living In your present state of health, do you have any difficulty performing the following activities: 03/26/2019  Hearing? N  Vision? N  Difficulty concentrating or making decisions? N  Walking or climbing stairs? N  Dressing or bathing? N  Doing errands, shopping? N  Preparing Food and eating ? N  Using the Toilet? N  In the past six months, have you accidently leaked urine? N  Do you have problems with loss of bowel control? N  Managing your Medications? N  Managing your Finances? N  Housekeeping or managing your Housekeeping? N  Some recent data might be hidden    Patient Care Team: Binnie Rail, MD as PCP - General (Internal Medicine) Armbruster, Carlota Raspberry, MD as Consulting Physician (Gastroenterology) Evans Lance, MD as Consulting Physician (Cardiology)   Assessment:   This is a routine wellness examination for Elijah [name]. Physical assessment deferred to PCP.  Exercise Activities and Dietary recommendations Current Exercise Habits: Home exercise routine, Type of exercise: walking, Time (Minutes): 40, Frequency (Times/Week): 6, Weekly Exercise (Minutes/Week): 240, Exercise limited by: None identified  Diet (meal preparation, eat out, water intake, caffeinated beverages, dairy products, fruits and vegetables): in general, a "healthy" diet  , well balanced eats a variety of fruits and vegetables daily, limits salt, fat/cholesterol, sugar,carbohydrates,caffeine, drinks 6-8 glasses of water daily.  Discussed weight loss  strategies and using portion control.    Goals    . Patient Stated     I would like to lose weight by watching what I eat more closely and reading the weight loss tip sheet that I received.     . 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       Fall Risk Fall Risk  03/26/2019 03/26/2019 03/20/2018 03/14/2017 10/06/2015  Falls in the past year? 0 0 No No No  Number falls in past yr: 0 0 - - -  Injury with Fall? 0 - - - -  Depression Screen PHQ 2/9 Scores 03/26/2019 03/20/2018 03/14/2017 10/06/2015  PHQ - 2 Score 0 0 0 0  PHQ- 9 Score - 0 - -    Cognitive Function       Ad8 score reviewed for issues:  Issues making decisions: no  Less interest in hobbies / activities: no  Repeats questions, stories (family complaining): no  Trouble using ordinary gadgets (microwave, computer, phone):no  Forgets the month or year: no  Mismanaging finances: no  Remembering appts: no  Daily problems with thinking and/or memory: no Ad8 score is= 0  Immunization History  Administered Date(s) Administered  . Fluad Quad(high Dose 65+) 03/13/2019  . Influenza Split 05/07/2012  . Influenza, High Dose Seasonal PF 03/14/2017, 03/20/2018  . Influenza-Unspecified 04/24/2016  . Pneumococcal Conjugate-13 03/14/2017  . Pneumococcal Polysaccharide-23 03/20/2018  . Tdap 01/15/2005, 12/25/2015  . Zoster 03/31/2012   Screening Tests Health Maintenance  Topic Date Due  . COLONOSCOPY  04/05/2018  . TETANUS/TDAP  12/24/2025  . INFLUENZA VACCINE  Completed  . Hepatitis C Screening  Completed  . PNA vac Low Risk Adult  Completed      Plan:    Reviewed health maintenance screenings with patient today and relevant education, vaccines, and/or referrals were provided.   I have personally reviewed and noted the following in the patient's chart:   . Medical and social history . Use of  alcohol, tobacco or illicit drugs  . Current medications and supplements . Functional ability and status . Nutritional status . Physical activity . Advanced directives . List of other physicians . Hospitalizations, surgeries, and ER visits in previous 12 months . Vitals . Screenings to include cognitive, depression, and falls . Referrals and appointments  In addition, I have reviewed and discussed with patient certain preventive protocols, quality metrics, and best practice recommendations. A written personalized care plan for preventive services as well as general preventive health recommendations were provided to patient.     Michiel Cowboy, RN  03/26/2019    Medical screening examination/treatment/procedure(s) were performed by non-physician practitioner and as supervising physician I was immediately available for consultation/collaboration. I agree with above. Binnie Rail, MD

## 2019-03-25 NOTE — Progress Notes (Signed)
Subjective:    Patient ID: Elijah [name], male    DOB: 22-Dec-1950, 68 y.o.   MRN: [id number]  HPI He is here for a physical exam.   He saw urology over the summer for nocutria.  He is not on any medication for BPH.  He has gained some weight with the [place] pandemic.  He knows he needs to decrease his snacking and improve his diet a little bit.  He is exercising regularly.  Overall he feels well and has no concerns.  Medications and allergies reviewed with patient and updated if appropriate.  Patient Active Problem List   Diagnosis Date Noted  . Onychomycosis of great toe 03/21/2018  . Numbness 03/21/2018  . Adenomatous polyp of colon 11/22/2013  . Atrial fibrillation ([place]) 12/31/2010  . Hyperlipidemia 04/26/2008  . Hypothyroidism 02/19/2007    Current Outpatient Medications on File Prior to Visit  Medication Sig Dispense Refill  . aspirin 81 MG EC tablet Take 162 mg by mouth daily. Reported on 10/06/2015    . Omega-3 Fatty Acids (FISH OIL PO) Take 2 capsules by mouth daily.     Marland Kitchen SYNTHROID 125 MCG tablet TAKE 1 TABLET(125 MCG) BY MOUTH DAILY BEFORE BREAKFAST 90 tablet 3  . sildenafil (VIAGRA) 100 MG tablet TK 1 T PO PRN     No current facility-administered medications on file prior to visit.     Past Medical History:  Diagnosis Date  . Adenomatous colon polyp   . Atrial fibrillation [facility]) 2012   Dr Cristopher Peru seen as OP  . Atrial fibrillation ([place]) 11/22-23/2013   presentation as nausea, dizziness & chest tightness  . Diverticulosis 2007  . Hyperlipidemia   . Thyroid disease    hypothyroidism    Past Surgical History:  Procedure Laterality Date  . CHEST TUBE INSERTION  1982   Spontaneous Pneumothorax  . COLONOSCOPY    . COLONOSCOPY W/ POLYPECTOMY  2007   Adenomatous Polyp; [facility], Dr Olevia Perches  . Holter monitor-48 hour  03/2011, 12/2010   Dr Crissie Sickles  . TONSILLECTOMY    . TRANSTHORACIC ECHOCARDIOGRAM   12/2010,     Social History    Socioeconomic History  . Marital status: Married    Spouse name: Not on file  . Number of children: 2  . Years of education: Not on file  . Highest education level: Not on file  Occupational History  . Not on file  Social Needs  . Financial resource strain: Not hard at all  . Food insecurity    Worry: Never true    Inability: Never true  . Transportation needs    Medical: No    Non-medical: No  Tobacco Use  . Smoking status: Never Smoker  . Smokeless tobacco: Never Used  Substance and Sexual Activity  . Alcohol use: No  . Drug use: No  . Sexual activity: Never  Lifestyle  . Physical activity    Days per week: 6 days    Minutes per session: 40 min  . Stress: Only a little  Relationships  . Social connections    Talks on phone: More than three times a week    Gets together: More than three times a week    Attends religious service: More than 4 times per year    Active member of club or organization: Yes    Attends meetings of clubs or organizations: More than 4 times per year    Relationship status: Married  Other Topics Concern  .  Not on file  Social History Narrative   Exercise: 5 days of aerobic, two days of weights    Family History  Problem Relation Age of Onset  . Stroke Mother 50       cns aneurysm bleed X2  . Breast cancer Mother   . Valvular heart disease Mother        valve replacement  . Hypertension Mother   . Cerebral aneurysm Sister 59  . Hypertension Father   . Hypertension Sister   . Diabetes Neg Hx   . Colon cancer Neg Hx   . Rectal cancer Neg Hx   . Stomach cancer Neg Hx     Review of Systems  Constitutional: Negative for chills and fever.  Eyes: Negative for visual disturbance.  Respiratory: Negative for choking, shortness of breath and wheezing.   Cardiovascular: Positive for palpitations (occ Afib). Negative for chest pain and leg swelling.  Gastrointestinal: Negative for abdominal pain, blood in stool, constipation, diarrhea and  nausea.       No gerd  Genitourinary: Negative for dysuria and hematuria.  Musculoskeletal: Negative for arthralgias and back pain.  Skin: Negative for color change and rash.  Neurological: Negative for dizziness, light-headedness, numbness and headaches.  Psychiatric/Behavioral: Negative for dysphoric mood. The patient is not nervous/anxious.        Objective:   Vitals:   03/26/19 0748  BP: 136/84  Pulse: 70  Resp: 16  Temp: 97.7 F (36.5 C)  SpO2: 97%   Filed Weights   03/26/19 0748  Weight: 261 lb 12.8 oz (118.8 kg)   Body mass index is 32.72 kg/m.  BP Readings from Last 3 Encounters:  03/26/19 136/84  03/26/19 136/84  07/21/18 116/80    Wt Readings from Last 3 Encounters:  03/26/19 261 lb (118.4 kg)  03/26/19 261 lb 12.8 oz (118.8 kg)  07/21/18 252 lb (114.3 kg)     Physical Exam Constitutional: He appears well-developed and well-nourished. No distress.  HENT:  Head: Normocephalic and atraumatic.  Right Ear: External ear normal.  Left Ear: External ear normal.  Mouth/Throat: Oropharynx is clear and moist.  Normal ear canals and TM b/l  Eyes: Conjunctivae and EOM are normal.  Neck: Neck supple. No tracheal deviation present. No thyromegaly present.  No carotid bruit  Cardiovascular: Normal rate, regular rhythm, normal heart sounds and intact distal pulses.   No murmur heard. Pulmonary/Chest: Effort normal and breath sounds normal. No respiratory distress. He has no wheezes. He has no rales.  Abdominal: Soft. He exhibits no distension. There is no tenderness.  Genitourinary: deferred  Musculoskeletal: He exhibits no edema.  Lymphadenopathy:   He has no cervical adenopathy.  Skin: Skin is warm and dry. He is not diaphoretic.  Psychiatric: He has a normal mood and affect. His behavior is normal.         Assessment & Plan:   Physical exam: Screening blood work  ordered Immunizations  Flu vaccine up to date, discussed shingrix Colonoscopy   Due -   Will schedule Eye exams   Up to date  Exercise   Regular - walking, uses resistance bands Weight   Advised weight loss Substance abuse   none  See Problem List for Assessment and Plan of chronic medical problems.   Follow-up in 1 year

## 2019-03-26 ENCOUNTER — Ambulatory Visit (INDEPENDENT_AMBULATORY_CARE_PROVIDER_SITE_OTHER): Payer: Medicare Other | Admitting: *Deleted

## 2019-03-26 ENCOUNTER — Ambulatory Visit (INDEPENDENT_AMBULATORY_CARE_PROVIDER_SITE_OTHER): Payer: Medicare Other | Admitting: Internal Medicine

## 2019-03-26 ENCOUNTER — Encounter: Payer: Self-pay | Admitting: Internal Medicine

## 2019-03-26 ENCOUNTER — Other Ambulatory Visit: Payer: Self-pay

## 2019-03-26 ENCOUNTER — Other Ambulatory Visit (INDEPENDENT_AMBULATORY_CARE_PROVIDER_SITE_OTHER): Payer: Medicare Other

## 2019-03-26 VITALS — BP 136/84 | HR 70 | Temp 97.7°F | Resp 16 | Ht 75.0 in | Wt 261.8 lb

## 2019-03-26 VITALS — BP 136/84 | HR 70 | Resp 16 | Ht 75.0 in | Wt 261.0 lb

## 2019-03-26 DIAGNOSIS — Z125 Encounter for screening for malignant neoplasm of prostate: Secondary | ICD-10-CM | POA: Diagnosis not present

## 2019-03-26 DIAGNOSIS — E782 Mixed hyperlipidemia: Secondary | ICD-10-CM | POA: Diagnosis not present

## 2019-03-26 DIAGNOSIS — E039 Hypothyroidism, unspecified: Secondary | ICD-10-CM

## 2019-03-26 DIAGNOSIS — Z0000 Encounter for general adult medical examination without abnormal findings: Secondary | ICD-10-CM

## 2019-03-26 DIAGNOSIS — I4891 Unspecified atrial fibrillation: Secondary | ICD-10-CM

## 2019-03-26 LAB — CBC WITH DIFFERENTIAL/PLATELET
Basophils Absolute: 0 10*3/uL (ref 0.0–0.1)
Basophils Relative: 0.8 % (ref 0.0–3.0)
Eosinophils Absolute: 0.1 10*3/uL (ref 0.0–0.7)
Eosinophils Relative: 2 % (ref 0.0–5.0)
HCT: 45.4 % (ref 39.0–52.0)
Hemoglobin: 15.2 g/dL (ref 13.0–17.0)
Lymphocytes Relative: 40.7 % (ref 12.0–46.0)
Lymphs Abs: 2.2 10*3/uL (ref 0.7–4.0)
MCHC: 33.5 g/dL (ref 30.0–36.0)
MCV: 90.4 fl (ref 78.0–100.0)
Monocytes Absolute: 0.7 10*3/uL (ref 0.1–1.0)
Monocytes Relative: 12.4 % — ABNORMAL HIGH (ref 3.0–12.0)
Neutro Abs: 2.4 10*3/uL (ref 1.4–7.7)
Neutrophils Relative %: 44.1 % (ref 43.0–77.0)
Platelets: 161 10*3/uL (ref 150.0–400.0)
RBC: 5.02 Mil/uL (ref 4.22–5.81)
RDW: 13.5 % (ref 11.5–15.5)
WBC: 5.4 10*3/uL (ref 4.0–10.5)

## 2019-03-26 LAB — COMPREHENSIVE METABOLIC PANEL
ALT: 12 U/L (ref 0–53)
AST: 30 U/L (ref 0–37)
Albumin: 4 g/dL (ref 3.5–5.2)
Alkaline Phosphatase: 66 U/L (ref 39–117)
BUN: 19 mg/dL (ref 6–23)
CO2: 26 mEq/L (ref 19–32)
Calcium: 8.9 mg/dL (ref 8.4–10.5)
Chloride: 106 mEq/L (ref 96–112)
Creatinine, Ser: 1.05 mg/dL (ref 0.40–1.50)
GFR: 70.28 mL/min (ref >60.00)
Glucose, Bld: 77 mg/dL (ref 70–99)
Potassium: 3.9 mEq/L (ref 3.5–5.1)
Sodium: 139 mEq/L (ref 135–145)
Total Bilirubin: 0.8 mg/dL (ref 0.2–1.2)
Total Protein: 6.6 g/dL (ref 6.0–8.3)

## 2019-03-26 LAB — TSH: TSH: 2.08 u[IU]/mL (ref 0.35–4.50)

## 2019-03-26 LAB — LIPID PANEL
Cholesterol: 161 mg/dL (ref 0–200)
HDL: 42.6 mg/dL (ref >39.00)
LDL Cholesterol: 103 mg/dL — ABNORMAL HIGH (ref 0–99)
NonHDL: 118.73
Total CHOL/HDL Ratio: 4
Triglycerides: 77 mg/dL (ref 0.0–149.0)
VLDL: 15.4 mg/dL (ref 0.0–40.0)

## 2019-03-26 LAB — PSA, MEDICARE: PSA: 1.61 ng/ml (ref 0.10–4.00)

## 2019-03-26 NOTE — Patient Instructions (Signed)
Continue doing brain stimulating activities (puzzles, reading, adult coloring books, staying active) to keep memory sharp.   Continue to eat heart healthy diet (full of fruits, vegetables, whole grains, lean protein, water--limit salt, fat, and sugar intake) and increase physical activity as tolerated.   [name] , Thank you for taking time to come for your Medicare Wellness Visit. I appreciate your ongoing commitment to your health goals. Please review the following plan we discussed and let me know if I can assist you in the future.   These are the goals we discussed: Goals    . Patient Stated     I would like to lose weight by watching what I eat more closely and reading the weight loss tip sheet that I received.     . 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       This is a list of the screening recommended for you and due dates:  Health Maintenance  Topic Date Due  . Colon Cancer Screening  04/05/2018  . Tetanus Vaccine  12/24/2025  . Flu Shot  Completed  .  Hepatitis C: One time screening is recommended by Center for Disease Control  (CDC) for  adults born from [birth year] through 1965.   Completed  . Pneumonia vaccines  Completed

## 2019-03-26 NOTE — Assessment & Plan Note (Addendum)
Check lipid panel, cmp Diet controlled Regular exercise and healthy diet encouraged

## 2019-03-26 NOTE — Assessment & Plan Note (Signed)
Following with Dr. Lovena Le Taking aspirin 81 mg daily-no other anticoagulation because of low risk Stressed decreasing weight, which will help keep his blood pressure controlled CBC, TSH, CMP

## 2019-03-26 NOTE — Assessment & Plan Note (Signed)
Clinically euthyroid Check tsh  Titrate med dose if needed  

## 2019-03-28 ENCOUNTER — Encounter: Payer: Self-pay | Admitting: Internal Medicine

## 2019-05-23 ENCOUNTER — Emergency Department (HOSPITAL_COMMUNITY)
Admission: EM | Admit: 2019-05-23 | Discharge: 2019-05-23 | Disposition: A | Discharge status: Home / Self Care | Payer: Medicare Other | Attending: Emergency Medicine | Admitting: Emergency Medicine

## 2019-05-23 ENCOUNTER — Telehealth: Payer: Self-pay | Admitting: Cardiology

## 2019-05-23 ENCOUNTER — Encounter (HOSPITAL_COMMUNITY): Payer: Self-pay

## 2019-05-23 ENCOUNTER — Other Ambulatory Visit: Payer: Self-pay

## 2019-05-23 ENCOUNTER — Emergency Department (HOSPITAL_BASED_OUTPATIENT_CLINIC_OR_DEPARTMENT_OTHER)
Admit: 2019-05-23 | Discharge: 2019-05-23 | Disposition: A | Discharge status: Home / Self Care | Payer: Medicare Other | Attending: Emergency Medicine | Admitting: Emergency Medicine

## 2019-05-23 DIAGNOSIS — Z79899 Other long term (current) drug therapy: Secondary | ICD-10-CM | POA: Insufficient documentation

## 2019-05-23 DIAGNOSIS — Z7982 Long term (current) use of aspirin: Secondary | ICD-10-CM | POA: Diagnosis not present

## 2019-05-23 DIAGNOSIS — E039 Hypothyroidism, unspecified: Secondary | ICD-10-CM | POA: Insufficient documentation

## 2019-05-23 DIAGNOSIS — M79605 Pain in left leg: Secondary | ICD-10-CM | POA: Diagnosis present

## 2019-05-23 DIAGNOSIS — L03116 Cellulitis of left lower limb: Secondary | ICD-10-CM | POA: Diagnosis not present

## 2019-05-23 DIAGNOSIS — I8002 Phlebitis and thrombophlebitis of superficial vessels of left lower extremity: Secondary | ICD-10-CM | POA: Diagnosis not present

## 2019-05-23 DIAGNOSIS — M7989 Other specified soft tissue disorders: Secondary | ICD-10-CM

## 2019-05-23 MED ORDER — IBUPROFEN 200 MG PO TABS
400.0000 mg | ORAL_TABLET | Freq: Once | ORAL | Status: AC
  Administered 2019-05-23: 400 mg via ORAL
  Filled 2019-05-23: qty 2

## 2019-05-23 MED ORDER — CEPHALEXIN 500 MG PO CAPS: 1000.0000 mg | ORAL_CAPSULE | Freq: Two times a day (BID) | ORAL | 0 refills | Status: DC

## 2019-05-23 MED ORDER — CEPHALEXIN 500 MG PO CAPS
1000.0000 mg | ORAL_CAPSULE | Freq: Once | ORAL | Status: AC
  Administered 2019-05-23: 16:00:00 1000 mg via ORAL
  Filled 2019-05-23: qty 2

## 2019-05-23 NOTE — ED Triage Notes (Signed)
Pt presents with c/o left leg pain that started a few days ago but became worse today. Pt reports the pain is on the bottom portion of his leg, radiating to the back of his leg. Area is tender to to touch, red, and warm to touch.

## 2019-05-23 NOTE — Discharge Instructions (Signed)
It was our pleasure to provide your ER care today - we hope that you feel better.  Take antibiotic as prescribed. Keep skin in area very clean/dry.   You may try warm compresses to sore area.   Take anti-inflammatory type medication such as ibuprofen or naprosyn as need.   Follow up with primary care doctor in 1 week if symptoms fail to improve/resolve.  Return to ER if worse, new symptoms, high fevers, worsening or severe pain, spreading redness, or other concern.

## 2019-05-23 NOTE — Progress Notes (Signed)
Left lower extremity venous duplex has been completed. Preliminary results can be found in CV Proc through chart review.  Results were given to Dr. Melina Copa.  05/23/19 3:16 PM Elijah [name] RVT

## 2019-05-23 NOTE — ED Provider Notes (Addendum)
[facility] DEPT Provider Note   CSN: [id number] Arrival date & time: 05/23/19  1328     History   Chief Complaint Chief Complaint  Patient presents with  . Leg Pain    HPI Elijah [name] is a 68 y.o. male.     Patient c/o localized area pain, erythema, swelling to LLE for past 2 days. Denies specific injury to area, skin intact. Symptoms acute onset, moderate, dull, persistent, non radiating, slowly worse indicating area of erythema has gradually gotten bigger. Hx varicosities to legs, no hx dvt. Denies fever or chills. States does not feel ill or sick.   The history is provided by the patient.  Leg Pain Associated symptoms: no back pain and no fever     Past Medical History:  Diagnosis Date  . Adenomatous colon polyp   . Atrial fibrillation [facility]) 2012   Dr Cristopher Peru seen as OP  . Atrial fibrillation ([place]) 11/22-23/2013   presentation as nausea, dizziness & chest tightness  . Diverticulosis 2007  . Hyperlipidemia   . Thyroid disease    hypothyroidism    Patient Active Problem List   Diagnosis Date Noted  . Onychomycosis of great toe 03/21/2018  . Numbness 03/21/2018  . Adenomatous polyp of colon 11/22/2013  . Atrial fibrillation ([place]) 12/31/2010  . Hyperlipidemia 04/26/2008  . Hypothyroidism 02/19/2007    Past Surgical History:  Procedure Laterality Date  . CHEST TUBE INSERTION  1982   Spontaneous Pneumothorax  . COLONOSCOPY    . COLONOSCOPY W/ POLYPECTOMY  2007   Adenomatous Polyp; [facility], Dr Olevia Perches  . Holter monitor-48 hour  03/2011, 12/2010   Dr Crissie Sickles  . TONSILLECTOMY    . TRANSTHORACIC ECHOCARDIOGRAM   12/2010,         Home Medications    Prior to Admission medications   Medication Sig Start Date End Date Taking? Authorizing Provider  aspirin 81 MG EC tablet Take 162 mg by mouth daily. Reported on 10/06/2015    [provider]  Omega-3 Fatty Acids (FISH OIL PO) Take 2 capsules by mouth  daily.     [provider]  sildenafil (VIAGRA) 100 MG tablet TK 1 T PO PRN 12/01/18   [provider]  SYNTHROID 125 MCG tablet TAKE 1 TABLET(125 MCG) BY MOUTH DAILY BEFORE BREAKFAST 03/10/19   Binnie Rail, MD    Family History Family History  Problem Relation Age of Onset  . Stroke Mother 48       cns aneurysm bleed X2  . Breast cancer Mother   . Valvular heart disease Mother        valve replacement  . Hypertension Mother   . Cerebral aneurysm Sister 63  . Hypertension Father   . Hypertension Sister   . Diabetes Neg Hx   . Colon cancer Neg Hx   . Rectal cancer Neg Hx   . Stomach cancer Neg Hx     Social History Social History   Tobacco Use  . Smoking status: Never Smoker  . Smokeless tobacco: Never Used  Substance Use Topics  . Alcohol use: No  . Drug use: No     Allergies   Patient has no known allergies.   Review of Systems Review of Systems  Constitutional: Negative for fever.  HENT: Negative for sore throat.   Eyes: Negative for redness.  Respiratory: Negative for shortness of breath.   Cardiovascular: Negative for chest pain.  Gastrointestinal: Negative for abdominal pain.  Genitourinary: Negative for flank pain.  Musculoskeletal: Negative for back pain.  Skin:       Erythematous area to anterior left lower leg.   Neurological: Negative for headaches.  Hematological: Does not bruise/bleed easily.  Psychiatric/Behavioral: Negative for confusion.     Physical Exam Updated Vital Signs BP 135/89 (BP Location: Left Arm)   Pulse (!) 101   Temp 97.7 F (36.5 C) (Oral)   Resp 18   SpO2 99%   Physical Exam Vitals signs and nursing note reviewed.  Constitutional:      Appearance: Normal appearance. He is well-developed.  HENT:     Head: Atraumatic.     Nose: Nose normal.     Mouth/Throat:     Mouth: Mucous membranes are moist.  Eyes:     General: No scleral icterus.    Conjunctiva/sclera: Conjunctivae normal.  Neck:      Musculoskeletal: Neck supple.     Trachea: No tracheal deviation.  Cardiovascular:     Rate and Rhythm: Normal rate.     Pulses: Normal pulses.  Pulmonary:     Effort: Pulmonary effort is normal. No accessory muscle usage or respiratory distress.  Abdominal:     General: There is no distension.  Genitourinary:    Comments: No cva tenderness. Musculoskeletal:     Comments: Mild swelling to left lower leg. There is an erythematous area of mild induration approximately 1 by 2 cm to left anterior lower leg, with mild surrounding erythema extending outwards from there. No fluctuance or abscess. No crepitus. Intact distal pulses.   Skin:    General: Skin is warm and dry.     Findings: No rash.  Neurological:     Mental Status: He is alert.     Comments: Alert, speech clear. LLE w motor/sens grossly intact.   Psychiatric:        Mood and Affect: Mood normal.      ED Treatments / Results  Labs (all labs ordered are listed, but only abnormal results are displayed) Labs Reviewed - No data to display  EKG None  Vascular ultrasound: varicosities, no dvt.   Procedures Procedures (including critical care time)  Medications Ordered in ED Medications - No data to display   Initial Impression / Assessment and Plan / ED Course  I have reviewed the triage vital signs and the nursing notes.  Pertinent labs & imaging results that were available during my care of the patient were reviewed by me and considered in my medical decision making (see chart for details).  Vascular u/s ordered.   Reviewed nursing notes and prior charts for additional history.   Exam c/w superficial thrombophlebitis with mild cellulitis. Keflex po.   Vascular study reviewed/interpreted by me - no dvt. Discussed also w vascular tech who indicates no dvt - see below: Summary: Right: No evidence of common femoral vein obstruction. Left: Findings consistent with acute superficial vein thrombosis involving the  left great saphenous vein. There is no evidence of deep vein thrombosis in the lower extremity. No cystic structure found in the popliteal fossa. There are multiple varicose  veins in the calf which appear thrombosed.   Keflex po. Ibuprofen po.  Hr 88, rr 16, no increased wob, no cp or sob. Afebrile. Pt currently appear stable for d/c.   Rec heat, nsaid, keflex, pcp f/u.  Return precautions provided.     Final Clinical Impressions(s) / ED Diagnoses   Final diagnoses:  None    ED Discharge Orders  None          Lajean Saver, MD 05/23/19 1538

## 2019-05-23 NOTE — Telephone Encounter (Signed)
Patient called in reporting redness and swelling to his left calf which started yesterday.  States he was hanging Christmas lights Friday afternoon and felt a "charley horse" in the back of his left calf.  Yesterday noticed increasing redness which extended into the back of his calf.  Denies any fever, but area is somewhat warm to touch per patient report.  Area is also firm to palpation per patient.  Does have history of chronic atrial fibrillation and has only been maintained on aspirin 81 mg as his CHA2DS2-VASc score has been 1.  Given concerns for possible DVT or cellulitis I advised that he seek treatment today for further evaluation.  He was agreeable to this plan and thanked me for the call back.

## 2019-05-26 ENCOUNTER — Ambulatory Visit (INDEPENDENT_AMBULATORY_CARE_PROVIDER_SITE_OTHER): Payer: Medicare Other | Admitting: Internal Medicine

## 2019-05-26 ENCOUNTER — Encounter: Payer: Self-pay | Admitting: Internal Medicine

## 2019-05-26 ENCOUNTER — Other Ambulatory Visit: Payer: Self-pay

## 2019-05-26 DIAGNOSIS — I8002 Phlebitis and thrombophlebitis of superficial vessels of left lower extremity: Secondary | ICD-10-CM

## 2019-05-26 DIAGNOSIS — I809 Phlebitis and thrombophlebitis of unspecified site: Secondary | ICD-10-CM | POA: Insufficient documentation

## 2019-05-26 NOTE — Progress Notes (Signed)
Subjective:    Patient ID: Elijah [name], male    DOB: Nov 10, 1950, 68 y.o.   MRN: [id number]  HPI The patient is here for follow up.  He went to the ED 12/6 for localized pain, erythema and swelling in the LLE for 2 days.  He denied injury.  It was acute onset, moderate pain, persistent pain and non-radiating.  The area of erythema was getting larger. Denied fever.   He states a couple of days prior he had sudden, transient severe pain in the leg leg.  He had a little bit of pain linger, but it got worse on Sunday, 12/6.  When he looked at it the left lower leg was red.  He was advised to go to the ED to r/o a DVT.    He had an [place] in the ED and it was neg for DVT.  Exam was c/w superficial thrombophlebitis and mild cellulitis.  He was prescribed Keflex orally, advised to apply heat and advised ibuprofen po.     He is taking the keflex.  He is taking ASA 162 mg daily.  He applied heat the first night, but has not done it consistently since then.    Area still hard, and has a area that is hard area higher above.  These areas are more sore.  The redness has improved a little. He wonders if the swelling in the ankle is increased.  He sat all day yesterday doing a course.          Medications and allergies reviewed with patient and updated if appropriate.  Patient Active Problem List   Diagnosis Date Noted  . Superficial thrombophlebitis 05/26/2019  . Onychomycosis of great toe 03/21/2018  . Numbness 03/21/2018  . Adenomatous polyp of colon 11/22/2013  . Atrial fibrillation ([place]) 12/31/2010  . Hyperlipidemia 04/26/2008  . Hypothyroidism 02/19/2007    Current Outpatient Medications on File Prior to Visit  Medication Sig Dispense Refill  . aspirin 81 MG EC tablet Take 162 mg by mouth daily. Reported on 10/06/2015    . cephALEXin (KEFLEX) 500 MG capsule Take 2 capsules (1,000 mg total) by mouth 2 (two) times daily. 28 capsule 0  . Omega-3 Fatty Acids (FISH OIL PO) Take 2 capsules  by mouth daily.     . sildenafil (VIAGRA) 100 MG tablet TK 1 T PO PRN    . SYNTHROID 125 MCG tablet TAKE 1 TABLET(125 MCG) BY MOUTH DAILY BEFORE BREAKFAST 90 tablet 3   No current facility-administered medications on file prior to visit.     Past Medical History:  Diagnosis Date  . Adenomatous colon polyp   . Atrial fibrillation [facility]) 2012   Dr Cristopher Peru seen as OP  . Atrial fibrillation ([place]) 11/22-23/2013   presentation as nausea, dizziness & chest tightness  . Diverticulosis 2007  . Hyperlipidemia   . Thyroid disease    hypothyroidism    Past Surgical History:  Procedure Laterality Date  . CHEST TUBE INSERTION  1982   Spontaneous Pneumothorax  . COLONOSCOPY    . COLONOSCOPY W/ POLYPECTOMY  2007   Adenomatous Polyp; [facility], Dr Olevia Perches  . Holter monitor-48 hour  03/2011, 12/2010   Dr Crissie Sickles  . TONSILLECTOMY    . TRANSTHORACIC ECHOCARDIOGRAM   12/2010,     Social History   Socioeconomic History  . Marital status: Married    Spouse name: Not on file  . Number of children: 2  . Years of education: Not  on file  . Highest education level: Not on file  Occupational History  . Not on file  Social Needs  . Financial resource strain: Not hard at all  . Food insecurity    Worry: Never true    Inability: Never true  . Transportation needs    Medical: No    Non-medical: No  Tobacco Use  . Smoking status: Never Smoker  . Smokeless tobacco: Never Used  Substance and Sexual Activity  . Alcohol use: No  . Drug use: No  . Sexual activity: Never  Lifestyle  . Physical activity    Days per week: 6 days    Minutes per session: 40 min  . Stress: Only a little  Relationships  . Social connections    Talks on phone: More than three times a week    Gets together: More than three times a week    Attends religious service: More than 4 times per year    Active member of club or organization: Yes    Attends meetings of clubs or organizations: More than 4 times per  year    Relationship status: Married  Other Topics Concern  . Not on file  Social History Narrative   Exercise: 5 days of aerobic, two days of weights    Family History  Problem Relation Age of Onset  . Stroke Mother 55       cns aneurysm bleed X2  . Breast cancer Mother   . Valvular heart disease Mother        valve replacement  . Hypertension Mother   . Cerebral aneurysm Sister 71  . Hypertension Father   . Hypertension Sister   . Diabetes Neg Hx   . Colon cancer Neg Hx   . Rectal cancer Neg Hx   . Stomach cancer Neg Hx     Review of Systems  Constitutional: Negative for chills and fever.  Respiratory: Negative for shortness of breath.   Cardiovascular: Positive for leg swelling. Negative for chest pain.  Skin: Positive for color change. Negative for wound.       Objective:   Vitals:   05/26/19 1325  BP: (!) 152/94  Pulse: 73  Resp: 16  Temp: 97.7 F (36.5 C)  SpO2: 99%   BP Readings from Last 3 Encounters:  05/26/19 (!) 152/94  05/23/19 135/89  03/26/19 136/84   Wt Readings from Last 3 Encounters:  05/26/19 262 lb (118.8 kg)  03/26/19 261 lb (118.4 kg)  03/26/19 261 lb 12.8 oz (118.8 kg)   Body mass index is 32.75 kg/m.   Physical Exam    Constitutional: Appears well-developed and well-nourished. No distress.  Left lower extremity: mild left ankle swelling, several varicosities in left lower leg, superficial thrombosis anterior-medial varicosity and a slightly firm vein more proximal- medial aspect of leg, mild erythema connecting both, mild tenderness with palpation     Assessment & Plan:    See Problem List for Assessment and Plan of chronic medical problems.

## 2019-05-26 NOTE — Patient Instructions (Addendum)
Increase aspirin to 325 mg   Start using light compression socks   Get up and walk during the day as much as possible   Apply heat    Please call if there is no improvement in your symptoms.

## 2019-05-26 NOTE — Assessment & Plan Note (Signed)
Improved redness, still sore - possibly slightly more sore Mild ankle edema Chronic varicose veins - several in LLE Increase ASA to 325 mg daily Apply heat Start wearing compression socks Get up and walk more during the day Elevate legs when able  Call if no improvement

## 2019-06-07 ENCOUNTER — Other Ambulatory Visit: Payer: Self-pay | Admitting: Internal Medicine

## 2019-06-07 DIAGNOSIS — I8393 Asymptomatic varicose veins of bilateral lower extremities: Secondary | ICD-10-CM

## 2019-06-07 MED ORDER — CEPHALEXIN 500 MG PO CAPS: 1000.0000 mg | ORAL_CAPSULE | Freq: Two times a day (BID) | ORAL | 0 refills | Status: DC

## 2019-06-22 DIAGNOSIS — M79644 Pain in right finger(s): Secondary | ICD-10-CM | POA: Diagnosis not present

## 2019-06-22 DIAGNOSIS — M79645 Pain in left finger(s): Secondary | ICD-10-CM | POA: Diagnosis not present

## 2019-08-02 ENCOUNTER — Ambulatory Visit: Payer: Medicare PPO | Admitting: Internal Medicine

## 2019-08-02 ENCOUNTER — Encounter: Payer: Self-pay | Admitting: Internal Medicine

## 2019-08-02 ENCOUNTER — Other Ambulatory Visit: Payer: Self-pay

## 2019-08-02 VITALS — BP 132/88 | HR 75 | Ht 75.0 in | Wt 255.6 lb

## 2019-08-02 DIAGNOSIS — I482 Chronic atrial fibrillation, unspecified: Secondary | ICD-10-CM

## 2019-08-02 NOTE — Progress Notes (Signed)
HPI [name] returns for followup. He has chronic atrial fib and his CHADSVASC is 1. He was in the ER with calf pain and diagnosed with superficial phlebitis of the greater saphenous vein. In the interim, he has had several documented episodes of HTN including when he was in the ED with his episode of phlebitis. He brings in a log of his bp and notes that his ave bp is in the 124/75 range. No Known Allergies   Current Outpatient Medications  Medication Sig Dispense Refill  . aspirin 81 MG EC tablet Take 162 mg by mouth daily. Reported on 10/06/2015    . Omega-3 Fatty Acids (FISH OIL PO) Take 2 capsules by mouth daily.     Marland Kitchen SYNTHROID 125 MCG tablet TAKE 1 TABLET(125 MCG) BY MOUTH DAILY BEFORE BREAKFAST 90 tablet 3   No current facility-administered medications for this visit.     Past Medical History:  Diagnosis Date  . Adenomatous colon polyp   . Atrial fibrillation [facility]) 2012   Dr Cristopher Peru seen as OP  . Atrial fibrillation ([place]) 11/22-23/2013   presentation as nausea, dizziness & chest tightness  . Diverticulosis 2007  . Hyperlipidemia   . Thyroid disease    hypothyroidism    ROS:   All systems reviewed and negative except as noted in the HPI.   Past Surgical History:  Procedure Laterality Date  . CHEST TUBE INSERTION  1982   Spontaneous Pneumothorax  . COLONOSCOPY    . COLONOSCOPY W/ POLYPECTOMY  2007   Adenomatous Polyp;  GI, Dr Olevia Perches  . Holter monitor-48 hour  03/2011, 12/2010   Dr Crissie Sickles  . TONSILLECTOMY    . TRANSTHORACIC ECHOCARDIOGRAM   12/2010,      Family History  Problem Relation Age of Onset  . Stroke Mother 2       cns aneurysm bleed X2  . Breast cancer Mother   . Valvular heart disease Mother        valve replacement  . Hypertension Mother   . Cerebral aneurysm Sister 73  . Hypertension Father   . Hypertension Sister   . Diabetes Neg Hx   . Colon cancer Neg Hx   . Rectal cancer Neg Hx   . Stomach cancer Neg Hx       Social History   Socioeconomic History  . Marital status: Married    Spouse name: Not on file  . Number of children: 2  . Years of education: Not on file  . Highest education level: Not on file  Occupational History  . Not on file  Tobacco Use  . Smoking status: Never Smoker  . Smokeless tobacco: Never Used  Substance and Sexual Activity  . Alcohol use: No  . Drug use: No  . Sexual activity: Never  Other Topics Concern  . Not on file  Social History Narrative   Exercise: 5 days of aerobic, two days of weights   Social Determinants of Health   Financial Resource Strain:   . Difficulty of Paying Living Expenses: Not on file  Food Insecurity:   . Worried About Charity fundraiser in the Last Year: Not on file  . Ran Out of Food in the Last Year: Not on file  Transportation Needs:   . Lack of Transportation (Medical): Not on file  . Lack of Transportation (Non-Medical): Not on file  Physical Activity:   . Days of Exercise per Week: Not on file  .  Minutes of Exercise per Session: Not on file  Stress:   . Feeling of Stress : Not on file  Social Connections:   . Frequency of Communication with Friends and Family: Not on file  . Frequency of Social Gatherings with Friends and Family: Not on file  . Attends Religious Services: Not on file  . Active Member of Clubs or Organizations: Not on file  . Attends Archivist Meetings: Not on file  . Marital Status: Not on file  Intimate Partner Violence:   . Fear of Current or Ex-Partner: Not on file  . Emotionally Abused: Not on file  . Physically Abused: Not on file  . Sexually Abused: Not on file     BP 132/88   Pulse 75   Ht 6\' 3"  (1.905 m)   Wt 255 lb 9.6 oz (115.9 kg)   SpO2 98%   BMI 31.95 kg/m   Physical Exam:  Well appearing NAD HEENT: Unremarkable Neck:  No JVD, no thyromegally Lymphatics:  No adenopathy Back:  No CVA tenderness Lungs:  Clear with no wheezes HEART:  IRegular rate rhythm, no  murmurs, no rubs, no clicks Abd:  soft, positive bowel sounds, no organomegally, no rebound, no guarding Ext:  2 plus pulses, no edema, no cyanosis, no clubbing Skin:  No rashes no nodules Neuro:  CN II through XII intact, motor grossly intact  EKG - atrial fib with a controlled VR  DEVICE  Normal device function.  See PaceArt for details.   Assess/Plan: 1. Atrial fib - His CHADSVASC score remains 1. His bp was up when he was in the hospital but at home is well controlled.  2. Dyslipidemia -  His HDL was 42 and LDL was 103 on dietary therapy. I think that this is acceptable for primary prevention.  2. High blood pressure - he really has pre-hypertension. I asked him to continue to monitor his bp and call if his SBP/DBP start to average above 130/80. 3. Superficial phlebitis -  On exam his has some swelling in his left leg but does not have pain or redness.  [name].D.

## 2019-08-02 NOTE — Progress Notes (Signed)
Requested by:  Binnie Rail, MD [place],  [place] [address]  Reason for consultation: large varicose veins   History of Present Illness   Elijah [name] is a 69 y.o. (Oct 21, 1950) male who presents with chief complaint: large varicose veins bilateral lower extremities. Patient notes onset of swelling and knot in left leg 2 months ago, associated with small erythematous patch of the anterior lower leg.  The patient's symptoms include: pain and swelling.  The patient has had no history of DVT, positive history of varicose vein, no history of venous stasis ulcers, no history of  Lymphedema and  history of skin changes in left lower leg. No  There is family history of venous disorders.  The patient has attempted to use knee high compression stockings in the past however he has large legs and has struggled to find ones that fit.  The patient presented to the emergency department on 05/23/2019 complaining of pain, erythema and swelling of his left lower extremity for 2 days. On examination there was a small 1 x 2 cm area of erythema on the left anterior lower leg. He underwent a venous ultrasound of his left lower extremity for evaluation of DVT. Findings were consistent with acute superficial vein thrombosis involving the left greater saphenous vein. There was no evidence of deep vein thrombosis in the left lower extremity. Multiple veins win the calf which appeared thrombosed were noted. He was treated with warm compresses and NSAIDs  He has had resolution of the redness, swelling and pain in his left leg. He does still have any area of palpable firmness which is non tender. He complains of aching and heaviness in his legs. He also has multiple spider veins and varicose veins in both legs but they are not tender, no bleeding, itching, or burning.  He has hx of traveling a lot for work and is generally very active. He just retired at the end of 2020  He has hx of atrial  fibrillation which he has never been symptomatic. He only is presently on Aspirin and not taking any medication for the atrial fibrillation   Past Medical History:  Diagnosis Date  . Adenomatous colon polyp   . Atrial fibrillation [facility]) 2012   Dr Cristopher Peru seen as OP  . Atrial fibrillation ([place]) 11/22-23/2013   presentation as nausea, dizziness & chest tightness  . Diverticulosis 2007  . Hyperlipidemia   . Thyroid disease    hypothyroidism    Past Surgical History:  Procedure Laterality Date  . CHEST TUBE INSERTION  1982   Spontaneous Pneumothorax  . COLONOSCOPY    . COLONOSCOPY W/ POLYPECTOMY  2007   Adenomatous Polyp; [facility], Dr Olevia Perches  . Holter monitor-48 hour  03/2011, 12/2010   Dr Crissie Sickles  . TONSILLECTOMY    . TRANSTHORACIC ECHOCARDIOGRAM   12/2010,     Social History   Socioeconomic History  . Marital status: Married    Spouse name: Not on file  . Number of children: 2  . Years of education: Not on file  . Highest education level: Not on file  Occupational History  . Not on file  Tobacco Use  . Smoking status: Never Smoker  . Smokeless tobacco: Never Used  Substance and Sexual Activity  . Alcohol use: No  . Drug use: No  . Sexual activity: Never  Other Topics Concern  . Not on file  Social History Narrative   Exercise: 5 days of aerobic,  two days of weights   Social Determinants of Health   Financial Resource Strain:   . Difficulty of Paying Living Expenses: Not on file  Food Insecurity:   . Worried About Charity fundraiser in the Last Year: Not on file  . Ran Out of Food in the Last Year: Not on file  Transportation Needs:   . Lack of Transportation (Medical): Not on file  . Lack of Transportation (Non-Medical): Not on file  Physical Activity:   . Days of Exercise per Week: Not on file  . Minutes of Exercise per Session: Not on file  Stress:   . Feeling of Stress : Not on file  Social Connections:   . Frequency of Communication with  Friends and Family: Not on file  . Frequency of Social Gatherings with Friends and Family: Not on file  . Attends Religious Services: Not on file  . Active Member of Clubs or Organizations: Not on file  . Attends Archivist Meetings: Not on file  . Marital Status: Not on file  Intimate Partner Violence:   . Fear of Current or Ex-Partner: Not on file  . Emotionally Abused: Not on file  . Physically Abused: Not on file  . Sexually Abused: Not on file    Family History  Problem Relation Age of Onset  . Stroke Mother 3       cns aneurysm bleed X2  . Breast cancer Mother   . Valvular heart disease Mother        valve replacement  . Hypertension Mother   . Cerebral aneurysm Sister 37  . Hypertension Father   . Hypertension Sister   . Diabetes Neg Hx   . Colon cancer Neg Hx   . Rectal cancer Neg Hx   . Stomach cancer Neg Hx     Current Outpatient Medications  Medication Sig Dispense Refill  . aspirin 81 MG EC tablet Take 162 mg by mouth daily. Reported on 10/06/2015    . Omega-3 Fatty Acids (FISH OIL PO) Take 2 capsules by mouth daily.     Marland Kitchen SYNTHROID 125 MCG tablet TAKE 1 TABLET(125 MCG) BY MOUTH DAILY BEFORE BREAKFAST 90 tablet 3   No current facility-administered medications for this visit.    No Known Allergies  REVIEW OF SYSTEMS (negative unless checked):   Cardiac:  []  Chest pain or chest pressure? []  Shortness of breath upon activity? []  Shortness of breath when lying flat? []  Irregular heart rhythm?  Vascular:  []  Pain in calf, thigh, or hip brought on by walking? []  Pain in feet at night that wakes you up from your sleep? []  Blood clot in your veins? []  Leg swelling?  Pulmonary:  []  Oxygen at home? []  Productive cough? []  Wheezing?  Neurologic:  []  Sudden weakness in arms or legs? []  Sudden numbness in arms or legs? []  Sudden onset of difficult speaking or slurred speech? []  Temporary loss of vision in one eye? []  Problems with  dizziness?  Gastrointestinal:  []  Blood in stool? []  Vomited blood?  Genitourinary:  []  Burning when urinating? []  Blood in urine?  Psychiatric:  []  Major depression  Hematologic:  []  Bleeding problems? []  Problems with blood clotting?  Dermatologic:  []  Rashes or ulcers?  Constitutional:  []  Fever or chills?  Ear/Nose/Throat:  []  Change in hearing? []  Nose bleeds? []  Sore throat?  Musculoskeletal:  []  Back pain? []  Joint pain? []  Muscle pain?   Physical Examination     Vitals:  08/04/19 1449  BP: (!) 144/93  Pulse: 74  Temp: (!) 96.7 F (35.9 C)  SpO2: 97%  Weight: 258 lb 9.6 oz (117.3 kg)  Height: 6\' 3"  (1.905 m)   Body mass index is 32.32 kg/m.  General Alert, O x 3, WD, not in any discomfort  Head Normal cephalic atraumatic  Neck Supple, mid-line trachea, no carotid bruits  Pulmonary Sym exp, , clear to auscultation bilaterally  Cardiac Irregularly, irregular rate and rhythm, no Murmurs  Vascular Vessel Right Left  Radial Palpable Palpable  Brachial Palpable Palpable  Carotid Palpable, No Bruit Palpable, No Bruit  Aorta Not palpable N/A  Femoral Palpable Palpable  Popliteal Not palpable Not palpable  PT Palpable Palpable  DP Palpable Palpable    Gastro- intestinal soft, non-distended, non-tender to palpation, No guarding or rebound, no HSM, no masses, , No palpable prominent aortic pulse,   Musculo- skeletal M/S 5/5 throughout , Extremities without ischemic changes , No edema present, Varicosities present: right thigh, large varicosities over right knee, right anterior medial and posterior leg; left thigh, large varicosities over left knee, left anterior, medial and posterior left leg; thrombosed varicosity medial mid left leg, not tender, overlying hyperpigmentation, Right distal medial leg with multiple varicosities with thinning overlying skin, no ulceration  Neurologic No weakness or parasthesias  Psychiatric Judgement intact, Mood &  affect appropriate  Dermatologic See M/S exam for extremity exam, No rashes otherwise noted  Lymphatic  Palpable lymph nodes: None    Non-invasive Vascular Imaging   BLE Venous Insufficiency Duplex (08/04/19):   LLE:   No DVT but has thrombosis in left calf in varicose vein branch of GSV  GSV reflux at [facility], mid thigh, distal thigh, knee,   SSV reflux at popliteal fossa, prox calf, mid calf,  deep venous reflux in the CFV  RLE:  No DVT no SVT  GSV reflux in the calf  SSV reflux  deep venous reflux popliteal vein  May 23, 2019   Lower extremity venous Doppler ultrasound Summary:  Right: No evidence of common femoral vein obstruction.  Left: Findings consistent with acute superficial vein thrombosis involving  the left great saphenous vein. There is no evidence of deep vein  thrombosis in the lower extremity. No cystic structure found in the  popliteal fossa. There are multiple varicose  veins in the calf which appear thrombosed.   Medical Decision Making   Elijah [name] is a 69 y.o. male who presents with: LE chronic venous insufficiency, with varicose veins with superficial thrombosis of left lower extremity. Symptoms are now improved in the LLE with conservative treatment. He has not tolerated compression stockings but in discussion he is willing to try them again. I encouraged him to elevate his legs, continue exercise as well as thigh high compression stockings. I discussed possibility of venous ablation and further vein intervention for his large varicose veins if they become symptomatic. He does have some varicose veins that are concerning for risk of bleeding. I discussed with the patient the use of his 20-30 mm thigh high compression stockings and need for 3 month trial of such.The patient will follow up in 3 months with Dr. Oneida Alar or Dr. Scot Dock for further evaluation and treatment   Karoline Caldwell, PA-C [facility] [place] [facility] Office:  [phone]  08/04/2019, 4:17 PM  Clinic MD: Dr. Scot Dock

## 2019-08-02 NOTE — Patient Instructions (Signed)

## 2019-08-04 ENCOUNTER — Other Ambulatory Visit (HOSPITAL_COMMUNITY): Payer: Self-pay | Admitting: Physician Assistant

## 2019-08-04 ENCOUNTER — Other Ambulatory Visit: Payer: Self-pay

## 2019-08-04 ENCOUNTER — Ambulatory Visit: Payer: Medicare PPO | Admitting: Physician Assistant

## 2019-08-04 ENCOUNTER — Ambulatory Visit (HOSPITAL_COMMUNITY)
Admission: RE | Admit: 2019-08-04 | Discharge: 2019-08-04 | Disposition: A | Discharge status: Home / Self Care | Payer: Medicare PPO | Source: Ambulatory Visit | Attending: Surgery | Admitting: Surgery

## 2019-08-04 VITALS — BP 144/93 | HR 74 | Temp 96.7°F | Ht 75.0 in | Wt 258.6 lb

## 2019-08-04 DIAGNOSIS — I8000 Phlebitis and thrombophlebitis of superficial vessels of unspecified lower extremity: Secondary | ICD-10-CM | POA: Insufficient documentation

## 2019-08-04 DIAGNOSIS — I872 Venous insufficiency (chronic) (peripheral): Secondary | ICD-10-CM

## 2019-08-04 DIAGNOSIS — I8002 Phlebitis and thrombophlebitis of superficial vessels of left lower extremity: Secondary | ICD-10-CM

## 2019-11-04 ENCOUNTER — Other Ambulatory Visit: Payer: Self-pay

## 2019-11-04 ENCOUNTER — Ambulatory Visit: Payer: Medicare PPO | Admitting: Vascular Surgery

## 2019-11-04 ENCOUNTER — Encounter: Payer: Self-pay | Admitting: Vascular Surgery

## 2019-11-04 VITALS — BP 129/87 | HR 74 | Temp 96.6°F | Resp 18 | Ht 75.0 in | Wt 258.0 lb

## 2019-11-04 DIAGNOSIS — I83813 Varicose veins of bilateral lower extremities with pain: Secondary | ICD-10-CM | POA: Diagnosis not present

## 2019-11-04 NOTE — Progress Notes (Signed)
REASON FOR VISIT:   Painful varicose veins bilaterally.  65-month follow-up visit.  MEDICAL ISSUES:   PAINFUL VARICOSE VEINS BILATERALLY: This patient has varicose veins bilaterally.  He has CEAP C4 venous disease.  He has significant aching pain and heaviness associated with his varicosities.  He has evidence of deep venous reflux bilaterally and also some superficial venous reflux.  We have discussed the importance of intermittent leg elevation and the proper positioning for this.  In addition I have encouraged him to continue to wear his compression stockings when he is on his feet a lot or traveling.  I feel that knee-high 15 to 20 mmHg pressure stockings would be more practical during the summer.  I have encouraged him to avoid prolonged sitting and standing.  We discussed the importance of exercise specifically walking and water aerobics.  We have also discussed the importance of maintaining a healthy weight as central obesity especially increases lower extremity venous pressure.  Currently I do not think he is a candidate for laser ablation of the left small saphenous vein I looked at this myself and could not confirm significant reflux today.  He has some short segment reflux in the right great saphenous vein at the knee and also on the left great saphenous vein in the distal thigh although the vein here is not especially dilated.  I explained that I had be happy to see him back at any time if his symptoms progress and we could repeat his duplex scan to see if his reflux in the superficial system has progressed.   HPI:   Elijah [name] is a pleasant 69 y.o. male who was seen by Karoline Caldwell, [place] on 08/04/2019.  He presented with painful varicose veins of both lower extremities.  He also had developed swelling and had not in his left leg 2 months prior to this which was associated with erythema and swelling.  The patient had no previous history of DVT.  In December 2020 he presented to the  emergency department with swelling of the left leg for 2 days and an area of erythema on the left anterior lower leg.  He underwent a venous duplex scan which showed evidence of superficial venous thrombosis involving the left great saphenous vein.  There was no evidence of deep venous thrombosis.  Patient symptoms had improved by the time he was seen in our office.  He was encouraged to wear his compression stockings and elevate his legs.  He was prescribed thigh-high compression stockings with a gradient of 20 to 30 mmHg.  On my history the patient describes some aching pain and heaviness in both legs which is aggravated by standing and sitting and relieved somewhat with elevation.  He has been wearing his thigh-high compression stockings with a gradient of 20 to 30 mmHg which do help his symptoms some.  He has had previous phlebitis as described above but no previous history of DVT and no previous venous procedures.  He has a history of atrial fibrillation and is followed by Dr. Crissie Sickles.  He is not on oral anticoagulation.  He is on aspirin.  Past Medical History:  Diagnosis Date  . Adenomatous colon polyp   . Atrial fibrillation [facility]) 2012   Dr Cristopher Peru seen as OP  . Atrial fibrillation ([place]) 11/22-23/2013   presentation as nausea, dizziness & chest tightness  . Diverticulosis 2007  . Hyperlipidemia   . Thyroid disease    hypothyroidism    Family History  Problem Relation Age of Onset  . Stroke Mother 64       cns aneurysm bleed X2  . Breast cancer Mother   . Valvular heart disease Mother        valve replacement  . Hypertension Mother   . Cerebral aneurysm Sister 70  . Hypertension Father   . Hypertension Sister   . Diabetes Neg Hx   . Colon cancer Neg Hx   . Rectal cancer Neg Hx   . Stomach cancer Neg Hx     SOCIAL HISTORY: Social History   Tobacco Use  . Smoking status: Never Smoker  . Smokeless tobacco: Never Used  Substance Use Topics  . Alcohol use: No     No Known Allergies  Current Outpatient Medications  Medication Sig Dispense Refill  . aspirin 81 MG EC tablet Take 162 mg by mouth daily. Reported on 10/06/2015    . Omega-3 Fatty Acids (FISH OIL PO) Take 2 capsules by mouth daily.     Marland Kitchen SYNTHROID 125 MCG tablet TAKE 1 TABLET(125 MCG) BY MOUTH DAILY BEFORE BREAKFAST 90 tablet 3   No current facility-administered medications for this visit.    REVIEW OF SYSTEMS:  [X]  denotes positive finding, [ ]  denotes negative finding Cardiac  Comments:  Chest pain or chest pressure:    Shortness of breath upon exertion:    Short of breath when lying flat:    Irregular heart rhythm: x       Vascular    Pain in calf, thigh, or hip brought on by ambulation:    Pain in feet at night that wakes you up from your sleep:     Blood clot in your veins: x   Leg swelling:         Pulmonary    Oxygen at home:    Productive cough:     Wheezing:         Neurologic    Sudden weakness in arms or legs:     Sudden numbness in arms or legs:     Sudden onset of difficulty speaking or slurred speech:    Temporary loss of vision in one eye:     Problems with dizziness:         Gastrointestinal    Blood in stool:     Vomited blood:         Genitourinary    Burning when urinating:     Blood in urine:        Psychiatric    Major depression:         Hematologic    Bleeding problems:    Problems with blood clotting too easily:        Skin    Rashes or ulcers:        Constitutional    Fever or chills:     PHYSICAL EXAM:   Vitals:   11/04/19 1403  BP: 129/87  Pulse: 74  Resp: 18  Temp: (!) 96.6 F (35.9 C)  TempSrc: Temporal  SpO2: 97%  Weight: 258 lb (117 kg)  Height: 6\' 3"  (1.905 m)    GENERAL: The patient is a well-nourished male, in no acute distress. The vital signs are documented above. CARDIAC: There is a regular rate and rhythm.  VASCULAR: I do not detect carotid bruits. He has palpable pedal pulses bilaterally. He has a  biphasic dorsalis pedis and posterior tibial signal bilaterally. He does have hyperpigmentation bilaterally consistent with chronic venous insufficiency. He has varicose  veins bilaterally as shown below.      PULMONARY: There is good air exchange bilaterally without wheezing or rales. ABDOMEN: Soft and non-tender with normal pitched bowel sounds.  MUSCULOSKELETAL: There are no major deformities or cyanosis. NEUROLOGIC: No focal weakness or paresthesias are detected. SKIN: There are no ulcers or rashes noted. PSYCHIATRIC: The patient has a normal affect.  DATA:    VENOUS DUPLEX: I reviewed the venous duplex scan that was done in February of this year.  On the right side there was no evidence of DVT.  There was deep venous reflux involving the popliteal vein.  There was superficial venous reflux involving the great saphenous vein in the distal thigh and proximal calf.  This segment was likely being fed by a escape perforator.  The diameters of the vein here range from 0.56-0.61.  On the left side there was no evidence of DVT.  There was deep venous reflux involving the common femoral vein and popliteal vein.  There was reflux in the superficial system involving the great saphenous vein in the mid and distal thigh.  The vein was not especially dilated.  There was reflux in the small saphenous vein on the left and the vein was significantly dilated up to 0.6-0.7 cm.  Deitra Mayo [facility] [phone]

## 2020-02-29 DIAGNOSIS — H21232 Degeneration of iris (pigmentary), left eye: Secondary | ICD-10-CM | POA: Diagnosis not present

## 2020-02-29 DIAGNOSIS — H2513 Age-related nuclear cataract, bilateral: Secondary | ICD-10-CM | POA: Diagnosis not present

## 2020-02-29 DIAGNOSIS — H5213 Myopia, bilateral: Secondary | ICD-10-CM | POA: Diagnosis not present

## 2020-03-04 ENCOUNTER — Other Ambulatory Visit: Payer: Self-pay | Admitting: Internal Medicine

## 2020-03-31 ENCOUNTER — Ambulatory Visit (INDEPENDENT_AMBULATORY_CARE_PROVIDER_SITE_OTHER): Payer: Medicare PPO

## 2020-03-31 ENCOUNTER — Ambulatory Visit: Payer: Self-pay | Admitting: Internal Medicine

## 2020-03-31 DIAGNOSIS — Z0000 Encounter for general adult medical examination without abnormal findings: Secondary | ICD-10-CM | POA: Diagnosis not present

## 2020-03-31 NOTE — Progress Notes (Signed)
I connected with Elijah [name] today by telephone and verified that I am speaking with the correct person using two identifiers. Location patient: home Location provider: work Persons participating in the virtual visit: Elijah [name] and Elijah Abu, LPN.  I discussed the limitations, risks, security and privacy concerns of performing an evaluation and management service by telephone and the availability of in person appointments. I also discussed with the patient that there may be a patient responsible charge related to this service. The patient expressed understanding and verbally consented to this telephonic visit.    Interactive audio and video telecommunications were attempted between this provider and patient, however failed, due to patient having technical difficulties OR patient did not have access to video capability.  We continued and completed visit with audio only.  Some vital signs may be absent or patient reported.   Time Spent with patient on telephone encounter: 20 minutes  Subjective:   Elijah [name] is a 69 y.o. male who presents for Medicare Annual/Subsequent preventive examination.  Review of Systems    No ROS. Medicare Wellness Visit. Cardiac Risk Factors include: advanced age (>38men, >37 women);dyslipidemia;family history of premature cardiovascular disease;male gender     Objective:    There were no vitals filed for this visit. There is no height or weight on file to calculate BMI.  Advanced Directives 03/31/2020 05/23/2019 03/26/2019 03/20/2018 04/06/2015 03/23/2015 05/09/2012  Does Patient Have a Medical Advance Directive? Yes Yes Yes Yes Yes Yes Patient has advance directive, copy not in chart  Type of Advance Directive Living will;Healthcare Power of Elijah [name];Living will Elijah [name];Living will Elijah [name];Living will - Elijah [name];Living will [place];Living will  Does patient want to make changes to medical advance directive? No - Patient declined - - - - No - Patient declined -  Copy of Elijah [name] in Chart? No - copy requested - No - copy requested No - copy requested - No - copy requested -  Pre-existing out of facility DNR order (yellow form or pink MOST form) - - - - - - No    Current Medications (verified) Outpatient Encounter Medications as of 03/31/2020  Medication Sig   Omega-3 Fatty Acids (FISH OIL PO) Take 2 capsules by mouth daily.    SYNTHROID 125 MCG tablet TAKE 1 TABLET(125 MCG) BY MOUTH DAILY BEFORE BREAKFAST Annual appt due in Oct must see provider for future refills   aspirin 81 MG EC tablet Take 162 mg by mouth daily. Reported on 10/06/2015 (Patient not taking: Reported on 03/31/2020)   No facility-administered encounter medications on file as of 03/31/2020.    Allergies (verified) Patient has no known allergies.   History: Past Medical History:  Diagnosis Date   Adenomatous colon polyp    Atrial fibrillation ([place]) 2012   Dr Cristopher Peru seen as OP   Atrial fibrillation ([place]) 11/22-23/2013   presentation as nausea, dizziness & chest tightness   Diverticulosis 2007   Hyperlipidemia    Thyroid disease    hypothyroidism   Past Surgical History:  Procedure Laterality Date   CHEST TUBE INSERTION  1982   Spontaneous Pneumothorax   COLONOSCOPY     COLONOSCOPY W/ POLYPECTOMY  2007   Adenomatous Polyp; [facility], Dr Olevia Perches   Holter monitor-48 hour  03/2011, 12/2010   Dr Crissie Sickles   TONSILLECTOMY     TRANSTHORACIC ECHOCARDIOGRAM   12/2010,    Family History  Problem Relation Age of Onset   Stroke Mother 81       cns aneurysm bleed X2   Breast cancer Mother    Valvular heart disease Mother        valve replacement   Hypertension Mother    Cerebral aneurysm Sister 1   Hypertension Father    Hypertension Sister    Diabetes Neg Hx    Colon cancer Neg  Hx    Rectal cancer Neg Hx    Stomach cancer Neg Hx    Social History   Socioeconomic History   Marital status: Married    Spouse name: Not on file   Number of children: 2   Years of education: Not on file   Highest education level: Not on file  Occupational History   Not on file  Tobacco Use   Smoking status: Never Smoker   Smokeless tobacco: Never Used  Vaping Use   Vaping Use: Never used  Substance and Sexual Activity   Alcohol use: No   Drug use: No   Sexual activity: Never  Other Topics Concern   Not on file  Social History Narrative   Exercise: 5 days of aerobic, two days of weights   Social Determinants of Health   Financial Resource Strain: Low Risk    Difficulty of Paying Living Expenses: Not hard at all  Food Insecurity: No Food Insecurity   Worried About Charity fundraiser in the Last Year: Never true   Ran Out of Food in the Last Year: Never true  Transportation Needs:    Lack of Transportation (Medical): Not on file   Lack of Transportation (Non-Medical): Not on file  Physical Activity: Sufficiently Active   Days of Exercise per Week: 7 days   Minutes of Exercise per Session: 60 min  Stress: No Stress Concern Present   Feeling of Stress : Not at all  Social Connections: Socially Integrated   Frequency of Communication with Friends and Family: More than three times a week   Frequency of Social Gatherings with Friends and Family: More than three times a week   Attends Religious Services: More than 4 times per year   Active Member of [employer] or Organizations: Yes   Attends Music therapist: More than 4 times per year   Marital Status: Married    Tobacco Counseling Counseling given: Not Answered   Clinical Intake:  Pre-visit preparation completed: Yes  Pain : No/denies pain     Nutritional Risks: None Diabetes: No  How often do you need to have someone help you when you read instructions, pamphlets, or  other written materials from your doctor or pharmacy?: 1 - Never What is the last grade level you completed in school?: [employer] in Business  Diabetic? no  Interpreter Needed?: No  Information entered by :: [employer]. Mubashir Mallek, LPN   Activities of Daily Living In your present state of health, do you have any difficulty performing the following activities: 03/31/2020  Hearing? N  Vision? N  Difficulty concentrating or making decisions? N  Walking or climbing stairs? N  Dressing or bathing? N  Doing errands, shopping? N  Preparing Food and eating ? N  Using the Toilet? N  In the past six months, have you accidently leaked urine? N  Do you have problems with loss of bowel control? N  Managing your Medications? N  Managing your Finances? N  Housekeeping or managing your Housekeeping? N  Some recent data might be  hidden    Patient Care Team: Binnie Rail, MD as PCP - General (Internal Medicine) Armbruster, Carlota Raspberry, MD as Consulting Physician (Gastroenterology) Evans Lance, MD as Consulting Physician (Cardiology)  Indicate any recent Medical Services you may have received from other than [facility] providers in the past year (date may be approximate).     Assessment:   This is a routine wellness examination for Elijah [name].  Hearing/Vision screen No exam data present  Dietary issues and exercise activities discussed: Current Exercise Habits: Home exercise routine, Type of exercise: strength training/weights;stretching;treadmill;walking, Time (Minutes): 60, Frequency (Times/Week): 7, Weekly Exercise (Minutes/Week): 420, Intensity: Moderate, Exercise limited by: None identified  Goals     Patient Stated     I would like to lose weight by watching what I eat more closely and reading the weight loss tip sheet that I received.      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     Patient Stated     I have a goal to workout at least 16 hours a month and continue to walk 2 miles everyday.  Also I would like to continue to lose weight and get down to 237 pounds.      Depression Screen PHQ 2/9 Scores 03/31/2020 03/26/2019 03/20/2018 03/14/2017 10/06/2015 09/27/2015  PHQ - 2 Score 0 0 0 0 0 0  PHQ- 9 Score - - 0 - - -    Fall Risk Fall Risk  03/31/2020 03/26/2019 03/26/2019 03/20/2018 03/14/2017  Falls in the past year? 0 0 0 No No  Number falls in past yr: 0 0 0 - -  Injury with Fall? 0 0 - - -  Risk for fall due to : No Fall Risks - - - -  Follow up Falls evaluation completed - - - -    Any stairs in or around the home? Yes  If so, are there any without handrails? No  Home free of loose throw rugs in walkways, pet beds, electrical cords, etc? Yes  Adequate lighting in your home to reduce risk of falls? Yes   ASSISTIVE DEVICES UTILIZED TO PREVENT FALLS:  Life alert? No  Use of a cane, walker or w/c? No  Grab bars in the bathroom? No  Shower chair or bench in shower? No  Elevated toilet seat or a handicapped toilet? No   TIMED UP AND GO:  Was the test performed? No .  Length of time to ambulate 10 feet: 0 sec.   Gait steady and fast without use of assistive device  Cognitive Function:        Immunizations Immunization History  Administered Date(s) Administered   Fluad Quad(high Dose 65+) 03/13/2019   Influenza Split 05/07/2012   Influenza, High Dose Seasonal PF 03/14/2017, 03/20/2018   Influenza-Unspecified 04/24/2016   Pneumococcal Conjugate-13 03/14/2017   Pneumococcal Polysaccharide-23 03/20/2018   Tdap 01/15/2005, 12/25/2015   Zoster 03/31/2012    TDAP status: Up to date Flu Vaccine status: Up to date Pneumococcal vaccine status: Up to date Covid-19 vaccine status: Completed vaccines  Qualifies for Shingles Vaccine? Yes   Zostavax completed Yes   Shingrix Completed?: No.    Education has been provided  regarding the importance of this vaccine. Patient has been advised to call insurance company to determine out of pocket expense if they have not yet received this vaccine. Advised may  also receive vaccine at local pharmacy or Health Dept. Verbalized acceptance and understanding.  Screening Tests Health Maintenance  Topic Date Due   COVID-19 Vaccine (1) Never done   COLONOSCOPY  04/05/2018   INFLUENZA VACCINE  01/16/2020   TETANUS/TDAP  12/24/2025   Hepatitis C Screening  Completed   PNA vac Low Risk Adult  Completed    Health Maintenance  Health Maintenance Due  Topic Date Due   COVID-19 Vaccine (1) Never done   COLONOSCOPY  04/05/2018   INFLUENZA VACCINE  01/16/2020    Colorectal cancer screening: Completed 04/06/2015. Repeat every 3 years  Lung Cancer Screening: (Low Dose CT Chest recommended if Age 83-80 years, 30 pack-year currently smoking OR have quit w/in 15years.) does not qualify.   Lung Cancer Screening Referral: no  Additional Screening:  Hepatitis C Screening: does qualify; Completed yes  Vision Screening: Recommended annual ophthalmology exams for early detection of glaucoma and other disorders of the eye. Is the patient up to date with their annual eye exam?  Yes  Who is the provider or what is the name of the office in which the patient attends annual eye exams? Luberta Mutter, MD If pt is not established with a provider, would they like to be referred to a provider to establish care? No .   Dental Screening: Recommended annual dental exams for proper oral hygiene  Community Resource Referral / Chronic Care Management: CRR required this visit?  No   CCM required this visit?  No      Plan:     I have personally reviewed and noted the following in the patients chart:    Medical and social history  Use of alcohol, tobacco or illicit drugs   Current medications and supplements  Functional ability and status  Nutritional  status  Physical activity  Advanced directives  List of other physicians  Hospitalizations, surgeries, and ER visits in previous 12 months  Vitals  Screenings to include cognitive, depression, and falls  Referrals and appointments  In addition, I have reviewed and discussed with patient certain preventive protocols, quality metrics, and best practice recommendations. A written personalized care plan for preventive services as well as general preventive health recommendations were provided to patient.     Sheral Flow, LPN   [id number]   Nurse Notes:  Patient is cogitatively intact. There were no vitals filed for this visit. There is no height or weight on file to calculate BMI. Patient stated that he has no issues with gait or balance; does not use any assistive devices.

## 2020-03-31 NOTE — Patient Instructions (Signed)
Elijah [name] , Thank you for taking time to come for your Medicare Wellness Visit. I appreciate your ongoing commitment to your health goals. Please review the following plan we discussed and let me know if I can assist you in the future.   Screening recommendations/referrals: Colonoscopy: 04/06/2015; due every 3 years (Patient will speak with pcp at next visit) Recommended yearly ophthalmology/optometry visit for glaucoma screening and checkup Recommended yearly dental visit for hygiene and checkup  Vaccinations: Influenza vaccine: 03/13/2019 Pneumococcal vaccine: up to date Tdap vaccine: 12/25/2015; due every 10 years Shingles vaccine: never done   Covid-19: up to date with booster  Advanced directives: Please bring a copy of your health care power of attorney and living will to the office at your convenience.   Conditions/risks identified: Yes; Reviewed health maintenance screenings with patient today and relevant education, vaccines, and/or referrals were provided. Please continue to do your personal lifestyle choices by: daily care of teeth and gums, regular physical activity (goal should be 5 days a week for 30 minutes), eat a healthy diet, avoid tobacco and drug use, limiting any alcohol intake, taking a low-dose aspirin (if not allergic or have been advised by your provider otherwise) and taking vitamins and minerals as recommended by your provider. Continue doing brain stimulating activities (puzzles, reading, adult coloring books, staying active) to keep memory sharp. Continue to eat heart healthy diet (full of fruits, vegetables, whole grains, lean protein, water--limit salt, fat, and sugar intake) and increase physical activity as tolerated.  Next appointment: Please schedule your next Medicare Wellness Visit with your Nurse Health Advisor in 1 year by calling [phone].  Preventive Care 5 Years and Older, Male Preventive care refers to lifestyle choices and visits with your  health care provider that can promote health and wellness. What does preventive care include?  A yearly physical exam. This is also called an annual well check.  Dental exams once or twice a year.  Routine eye exams. Ask your health care provider how often you should have your eyes checked.  Personal lifestyle choices, including:  Daily care of your teeth and gums.  Regular physical activity.  Eating a healthy diet.  Avoiding tobacco and drug use.  Limiting alcohol use.  Practicing safe sex.  Taking low doses of aspirin every day.  Taking vitamin and mineral supplements as recommended by your health care provider. What happens during an annual well check? The services and screenings done by your health care provider during your annual well check will depend on your age, overall health, lifestyle risk factors, and family history of disease. Counseling  Your health care provider may ask you questions about your:  Alcohol use.  Tobacco use.  Drug use.  Emotional well-being.  Home and relationship well-being.  Sexual activity.  Eating habits.  History of falls.  Memory and ability to understand (cognition).  Work and work Statistician. Screening  You may have the following tests or measurements:  Height, weight, and BMI.  Blood pressure.  Lipid and cholesterol levels. These may be checked every 5 years, or more frequently if you are over 20 years old.  Skin check.  Lung cancer screening. You may have this screening every year starting at age 20 if you have a 30-pack-year history of smoking and currently smoke or have quit within the past 15 years.  Fecal occult blood test (FOBT) of the stool. You may have this test every year starting at age 38.  Flexible sigmoidoscopy or colonoscopy. You may have  a sigmoidoscopy every 5 years or a colonoscopy every 10 years starting at age 86.  Prostate cancer screening. Recommendations will vary depending on your family  history and other risks.  Hepatitis C blood test.  Hepatitis B blood test.  Sexually transmitted disease (STD) testing.  Diabetes screening. This is done by checking your blood sugar (glucose) after you have not eaten for a while (fasting). You may have this done every 1-3 years.  Abdominal aortic aneurysm (AAA) screening. You may need this if you are a current or former smoker.  Osteoporosis. You may be screened starting at age 95 if you are at high risk. Talk with your health care provider about your test results, treatment options, and if necessary, the need for more tests. Vaccines  Your health care provider may recommend certain vaccines, such as:  Influenza vaccine. This is recommended every year.  Tetanus, diphtheria, and acellular pertussis (Tdap, Td) vaccine. You may need a Td booster every 10 years.  Zoster vaccine. You may need this after age 45.  Pneumococcal 13-valent conjugate (PCV13) vaccine. One dose is recommended after age 60.  Pneumococcal polysaccharide (PPSV23) vaccine. One dose is recommended after age 10. Talk to your health care provider about which screenings and vaccines you need and how often you need them. This information is not intended to replace advice given to you by your health care provider. Make sure you discuss any questions you have with your health care provider. Document Released: 06/30/2015 Document Revised: 02/21/2016 Document Reviewed: 04/04/2015 Elsevier Interactive Patient Education  2017 [place] Prevention in the Home Falls can cause injuries. They can happen to people of all ages. There are many things you can do to make your home safe and to help prevent falls. What can I do on the outside of my home?  Regularly fix the edges of walkways and driveways and fix any cracks.  Remove anything that might make you trip as you walk through a door, such as a raised step or threshold.  Trim any bushes or trees on the path to  your home.  Use bright outdoor lighting.  Clear any walking paths of anything that might make someone trip, such as rocks or tools.  Regularly check to see if handrails are loose or broken. Make sure that both sides of any steps have handrails.  Any raised decks and porches should have guardrails on the edges.  Have any leaves, snow, or ice cleared regularly.  Use sand or salt on walking paths during winter.  Clean up any spills in your garage right away. This includes oil or grease spills. What can I do in the bathroom?  Use night lights.  Install grab bars by the toilet and in the tub and shower. Do not use towel bars as grab bars.  Use non-skid mats or decals in the tub or shower.  If you need to sit down in the shower, use a plastic, non-slip stool.  Keep the floor dry. Clean up any water that spills on the floor as soon as it happens.  Remove soap buildup in the tub or shower regularly.  Attach bath mats securely with double-sided non-slip rug tape.  Do not have throw rugs and other things on the floor that can make you trip. What can I do in the bedroom?  Use night lights.  Make sure that you have a light by your bed that is easy to reach.  Do not use any sheets or blankets that  are too big for your bed. They should not hang down onto the floor.  Have a firm chair that has side arms. You can use this for support while you get dressed.  Do not have throw rugs and other things on the floor that can make you trip. What can I do in the kitchen?  Clean up any spills right away.  Avoid walking on wet floors.  Keep items that you use a lot in easy-to-reach places.  If you need to reach something above you, use a strong step stool that has a grab bar.  Keep electrical cords out of the way.  Do not use floor polish or wax that makes floors slippery. If you must use wax, use non-skid floor wax.  Do not have throw rugs and other things on the floor that can make  you trip. What can I do with my stairs?  Do not leave any items on the stairs.  Make sure that there are handrails on both sides of the stairs and use them. Fix handrails that are broken or loose. Make sure that handrails are as long as the stairways.  Check any carpeting to make sure that it is firmly attached to the stairs. Fix any carpet that is loose or worn.  Avoid having throw rugs at the top or bottom of the stairs. If you do have throw rugs, attach them to the floor with carpet tape.  Make sure that you have a light switch at the top of the stairs and the bottom of the stairs. If you do not have them, ask someone to add them for you. What else can I do to help prevent falls?  Wear shoes that:  Do not have high heels.  Have rubber bottoms.  Are comfortable and fit you well.  Are closed at the toe. Do not wear sandals.  If you use a stepladder:  Make sure that it is fully opened. Do not climb a closed stepladder.  Make sure that both sides of the stepladder are locked into place.  Ask someone to hold it for you, if possible.  Clearly mark and make sure that you can see:  Any grab bars or handrails.  First and last steps.  Where the edge of each step is.  Use tools that help you move around (mobility aids) if they are needed. These include:  Canes.  Walkers.  Scooters.  Crutches.  Turn on the lights when you go into a dark area. Replace any light bulbs as soon as they burn out.  Set up your furniture so you have a clear path. Avoid moving your furniture around.  If any of your floors are uneven, fix them.  If there are any pets around you, be aware of where they are.  Review your medicines with your doctor. Some medicines can make you feel dizzy. This can increase your chance of falling. Ask your doctor what other things that you can do to help prevent falls. This information is not intended to replace advice given to you by your health care provider.  Make sure you discuss any questions you have with your health care provider. Document Released: 03/30/2009 Document Revised: 11/09/2015 Document Reviewed: 07/08/2014 Elsevier Interactive Patient Education  2017 [employer].

## 2020-04-02 NOTE — Progress Notes (Signed)
Subjective:    Patient ID: Elijah [name], male    DOB: [birth year], 69 y.o.   MRN: [id number]  HPI He is here for a physical exam.   Overall he feels great and has no concerns.  He has lost some weight recently.     Medications and allergies reviewed with patient and updated if appropriate.  Patient Active Problem List   Diagnosis Date Noted  . Superficial thrombophlebitis 05/26/2019  . Onychomycosis of great toe 03/21/2018  . Numbness 03/21/2018  . Adenomatous polyp of colon 11/22/2013  . Atrial fibrillation ([place]) 12/31/2010  . Hyperlipidemia 04/26/2008  . Hypothyroidism 02/19/2007    Current Outpatient Medications on File Prior to Visit  Medication Sig Dispense Refill  . Omega-3 Fatty Acids (FISH OIL PO) Take 2 capsules by mouth daily.     Marland Kitchen SYNTHROID 125 MCG tablet TAKE 1 TABLET(125 MCG) BY MOUTH DAILY BEFORE BREAKFAST Annual appt due in Oct must see provider for future refills 30 tablet 0  . aspirin 81 MG EC tablet Take 162 mg by mouth daily. Reported on 10/06/2015 (Patient not taking: Reported on 04/03/2020)     No current facility-administered medications on file prior to visit.    Past Medical History:  Diagnosis Date  . Adenomatous colon polyp   . Atrial fibrillation [facility]) 2012   Dr Cristopher Peru seen as OP  . Atrial fibrillation ([place]) 11/22-23/2013   presentation as nausea, dizziness & chest tightness  . Diverticulosis 2007  . Hyperlipidemia   . Thyroid disease    hypothyroidism    Past Surgical History:  Procedure Laterality Date  . CHEST TUBE INSERTION  1982   Spontaneous Pneumothorax  . COLONOSCOPY    . COLONOSCOPY W/ POLYPECTOMY  2007   Adenomatous Polyp; [facility], Dr Olevia Perches  . Holter monitor-48 hour  03/2011, 12/2010   Dr Crissie Sickles  . TONSILLECTOMY    . TRANSTHORACIC ECHOCARDIOGRAM   12/2010,     Social History   Socioeconomic History  . Marital status: Married    Spouse name: Not on file  . Number of children: 2  . Years of  education: Not on file  . Highest education level: Not on file  Occupational History  . Not on file  Tobacco Use  . Smoking status: Never Smoker  . Smokeless tobacco: Never Used  Vaping Use  . Vaping Use: Never used  Substance and Sexual Activity  . Alcohol use: No  . Drug use: No  . Sexual activity: Never  Other Topics Concern  . Not on file  Social History Narrative   Exercise: 5 days of aerobic, two days of weights   Social Determinants of Health   Financial Resource Strain: Low Risk   . Difficulty of Paying Living Expenses: Not hard at all  Food Insecurity: No Food Insecurity  . Worried About Charity fundraiser in the Last Year: Never true  . Ran Out of Food in the Last Year: Never true  Transportation Needs:   . Lack of Transportation (Medical): Not on file  . Lack of Transportation (Non-Medical): Not on file  Physical Activity: Sufficiently Active  . Days of Exercise per Week: 7 days  . Minutes of Exercise per Session: 60 min  Stress: No Stress Concern Present  . Feeling of Stress : Not at all  Social Connections: Socially Integrated  . Frequency of Communication with Friends and Family: More than three times a week  . Frequency of Social Gatherings with Friends  and Family: More than three times a week  . Attends Religious Services: More than 4 times per year  . Active Member of Clubs or Organizations: Yes  . Attends Archivist Meetings: More than 4 times per year  . Marital Status: Married    Family History  Problem Relation Age of Onset  . Stroke Mother 57       cns aneurysm bleed X2  . Breast cancer Mother   . Valvular heart disease Mother        valve replacement  . Hypertension Mother   . Cerebral aneurysm Sister 83  . Hypertension Father   . Hypertension Sister   . Diabetes Neg Hx   . Colon cancer Neg Hx   . Rectal cancer Neg Hx   . Stomach cancer Neg Hx     Review of Systems  Constitutional: Negative for chills, fatigue and fever.   Eyes: Negative for visual disturbance.  Respiratory: Negative for cough, shortness of breath and wheezing.   Cardiovascular: Positive for palpitations. Negative for chest pain and leg swelling.  Gastrointestinal: Negative for abdominal pain, blood in stool, constipation, diarrhea and nausea.  Genitourinary: Negative for difficulty urinating, dysuria and hematuria.  Neurological: Positive for light-headedness. Negative for headaches.       Objective:   Vitals:   04/03/20 0749  BP: 126/78  Pulse: 62  Temp: 98.1 F (36.7 C)  SpO2: 98%   Filed Weights   04/03/20 0749  Weight: 247 lb (112 kg)   Body mass index is 30.87 kg/m.  BP Readings from Last 3 Encounters:  04/03/20 126/78  11/04/19 129/87  08/04/19 (!) 144/93    Wt Readings from Last 3 Encounters:  04/03/20 247 lb (112 kg)  11/04/19 258 lb (117 kg)  08/04/19 258 lb 9.6 oz (117.3 kg)     Physical Exam Constitutional: He appears well-developed and well-nourished. No distress.  HENT:  Head: Normocephalic and atraumatic.  Right Ear: External ear normal.  Left Ear: External ear normal.  Mouth/Throat: Oropharynx is clear and moist.  Normal ear canals and TM b/l  Eyes: Conjunctivae and EOM are normal.  Neck: Neck supple. No tracheal deviation present. No thyromegaly present.  No carotid bruit  Cardiovascular: Normal rate, regular rhythm, normal heart sounds and intact distal pulses.   No murmur heard. Pulmonary/Chest: Effort normal and breath sounds normal. No respiratory distress. He has no wheezes. He has no rales.  Abdominal: Soft. He exhibits no distension. There is no tenderness.  Genitourinary: deferred  Musculoskeletal: He exhibits no edema.  Lymphadenopathy:   He has no cervical adenopathy.  Skin: Skin is warm and dry. He is not diaphoretic.  Psychiatric: He has a normal mood and affect. His behavior is normal.         Assessment & Plan:   Physical exam: Screening blood work   ordered Immunizations  Flu vac today, discussed shingrix Colonoscopy   Due -  He will schedule Eye exams   Up to date  Exercise   regular Weight  Has lost weight and working on weight loss Substance abuse   none   See Problem List for Assessment and Plan of chronic medical problems.   This visit occurred during the SARS-CoV-2 public health emergency.  Safety protocols were in place, including screening questions prior to the visit, additional usage of staff PPE, and extensive cleaning of exam room while observing appropriate contact time as indicated for disinfecting solutions.

## 2020-04-02 NOTE — Patient Instructions (Addendum)
[facility] Phone: [phone]    Blood work was ordered.    All other Health Maintenance issues reviewed.   All recommended immunizations and age-appropriate screenings are up-to-date or discussed.  Flu immunization administered today.    Medications reviewed and updated.  Changes include :   none     Please followup in 1 year    Health Maintenance, Male Adopting a healthy lifestyle and getting preventive care are important in promoting health and wellness. Ask your health care provider about:  The right schedule for you to have regular tests and exams.  Things you can do on your own to prevent diseases and keep yourself healthy. What should I know about diet, weight, and exercise? Eat a healthy diet   Eat a diet that includes plenty of vegetables, fruits, low-fat dairy products, and lean protein.  Do not eat a lot of foods that are high in solid fats, added sugars, or sodium. Maintain a healthy weight Body mass index (BMI) is a measurement that can be used to identify possible weight problems. It estimates body fat based on height and weight. Your health care provider can help determine your BMI and help you achieve or maintain a healthy weight. Get regular exercise Get regular exercise. This is one of the most important things you can do for your health. Most adults should:  Exercise for at least 150 minutes each week. The exercise should increase your heart rate and make you sweat (moderate-intensity exercise).  Do strengthening exercises at least twice a week. This is in addition to the moderate-intensity exercise.  Spend less time sitting. Even light physical activity can be beneficial. Watch cholesterol and blood lipids Have your blood tested for lipids and cholesterol at 69 years of age, then have this test every 5 years. You may need to have your cholesterol levels checked more often if:  Your lipid or cholesterol levels are high.  You are older than 69  years of age.  You are at high risk for heart disease. What should I know about cancer screening? Many types of cancers can be detected early and may often be prevented. Depending on your health history and family history, you may need to have cancer screening at various ages. This may include screening for:  Colorectal cancer.  Prostate cancer.  Skin cancer.  Lung cancer. What should I know about heart disease, diabetes, and high blood pressure? Blood pressure and heart disease  High blood pressure causes heart disease and increases the risk of stroke. This is more likely to develop in people who have high blood pressure readings, are of African descent, or are overweight.  Talk with your health care provider about your target blood pressure readings.  Have your blood pressure checked: ? Every 3-5 years if you are 70-61 years of age. ? Every year if you are 18 years old or older.  If you are between the ages of 27 and 62 and are a current or former smoker, ask your health care provider if you should have a one-time screening for abdominal aortic aneurysm (AAA). Diabetes Have regular diabetes screenings. This checks your fasting blood sugar level. Have the screening done:  Once every three years after age 30 if you are at a normal weight and have a low risk for diabetes.  More often and at a younger age if you are overweight or have a high risk for diabetes. What should I know about preventing infection? Hepatitis B If you have a  higher risk for hepatitis B, you should be screened for this virus. Talk with your health care provider to find out if you are at risk for hepatitis B infection. Hepatitis C Blood testing is recommended for:  Everyone born from [birth year] through 1965.  Anyone with known risk factors for hepatitis C. Sexually transmitted infections (STIs)  You should be screened each year for STIs, including gonorrhea and chlamydia, if: ? You are sexually active and are  younger than 69 years of age. ? You are older than 69 years of age and your health care provider tells you that you are at risk for this type of infection. ? Your sexual activity has changed since you were last screened, and you are at increased risk for chlamydia or gonorrhea. Ask your health care provider if you are at risk.  Ask your health care provider about whether you are at high risk for HIV. Your health care provider may recommend a prescription medicine to help prevent HIV infection. If you choose to take medicine to prevent HIV, you should first get tested for HIV. You should then be tested every 3 months for as long as you are taking the medicine. Follow these instructions at home: Lifestyle  Do not use any products that contain nicotine or tobacco, such as cigarettes, e-cigarettes, and chewing tobacco. If you need help quitting, ask your health care provider.  Do not use street drugs.  Do not share needles.  Ask your health care provider for help if you need support or information about quitting drugs. Alcohol use  Do not drink alcohol if your health care provider tells you not to drink.  If you drink alcohol: ? Limit how much you have to 0-2 drinks a day. ? Be aware of how much alcohol is in your drink. In the [place] one drink equals one 12 oz bottle of beer (355 mL), one 5 oz glass of wine (148 mL), or one 1 oz glass of hard liquor (44 mL). General instructions  Schedule regular health, dental, and eye exams.  Stay current with your vaccines.  Tell your health care provider if: ? You often feel depressed. ? You have ever been abused or do not feel safe at home. Summary  Adopting a healthy lifestyle and getting preventive care are important in promoting health and wellness.  Follow your health care provider's instructions about healthy diet, exercising, and getting tested or screened for diseases.  Follow your health care provider's instructions on monitoring your  cholesterol and blood pressure. This information is not intended to replace advice given to you by your health care provider. Make sure you discuss any questions you have with your health care provider. Document Revised: 05/27/2018 Document Reviewed: 05/27/2018 Elsevier Patient Education  2020 [employer].

## 2020-04-03 ENCOUNTER — Other Ambulatory Visit: Payer: Self-pay | Admitting: Internal Medicine

## 2020-04-03 ENCOUNTER — Other Ambulatory Visit: Payer: Self-pay

## 2020-04-03 ENCOUNTER — Encounter: Payer: Self-pay | Admitting: Internal Medicine

## 2020-04-03 ENCOUNTER — Ambulatory Visit (INDEPENDENT_AMBULATORY_CARE_PROVIDER_SITE_OTHER): Payer: Medicare PPO | Admitting: Internal Medicine

## 2020-04-03 VITALS — BP 126/78 | HR 62 | Temp 98.1°F | Ht 75.0 in | Wt 247.0 lb

## 2020-04-03 DIAGNOSIS — Z125 Encounter for screening for malignant neoplasm of prostate: Secondary | ICD-10-CM

## 2020-04-03 DIAGNOSIS — Z23 Encounter for immunization: Secondary | ICD-10-CM

## 2020-04-03 DIAGNOSIS — I480 Paroxysmal atrial fibrillation: Secondary | ICD-10-CM | POA: Diagnosis not present

## 2020-04-03 DIAGNOSIS — E782 Mixed hyperlipidemia: Secondary | ICD-10-CM

## 2020-04-03 DIAGNOSIS — Z0000 Encounter for general adult medical examination without abnormal findings: Secondary | ICD-10-CM

## 2020-04-03 DIAGNOSIS — E039 Hypothyroidism, unspecified: Secondary | ICD-10-CM | POA: Diagnosis not present

## 2020-04-03 LAB — CBC WITH DIFFERENTIAL/PLATELET
Basophils Absolute: 0.1 10*3/uL (ref 0.0–0.1)
Basophils Relative: 1.2 % (ref 0.0–3.0)
Eosinophils Absolute: 0.1 10*3/uL (ref 0.0–0.7)
Eosinophils Relative: 2.4 % (ref 0.0–5.0)
HCT: 44.1 % (ref 39.0–52.0)
Hemoglobin: 14.7 g/dL (ref 13.0–17.0)
Lymphocytes Relative: 40.6 % (ref 12.0–46.0)
Lymphs Abs: 2.5 10*3/uL (ref 0.7–4.0)
MCHC: 33.4 g/dL (ref 30.0–36.0)
MCV: 90 fl (ref 78.0–100.0)
Monocytes Absolute: 0.9 10*3/uL (ref 0.1–1.0)
Monocytes Relative: 14.6 % — ABNORMAL HIGH (ref 3.0–12.0)
Neutro Abs: 2.6 10*3/uL (ref 1.4–7.7)
Neutrophils Relative %: 41.2 % — ABNORMAL LOW (ref 43.0–77.0)
Platelets: 225 10*3/uL (ref 150.0–400.0)
RBC: 4.91 Mil/uL (ref 4.22–5.81)
RDW: 13.4 % (ref 11.5–15.5)
WBC: 6.3 10*3/uL (ref 4.0–10.5)

## 2020-04-03 LAB — LIPID PANEL
Cholesterol: 155 mg/dL (ref 0–200)
HDL: 34.9 mg/dL — ABNORMAL LOW (ref >39.00)
LDL Cholesterol: 107 mg/dL — ABNORMAL HIGH (ref 0–99)
NonHDL: 119.78
Total CHOL/HDL Ratio: 4
Triglycerides: 64 mg/dL (ref 0.0–149.0)
VLDL: 12.8 mg/dL (ref 0.0–40.0)

## 2020-04-03 LAB — COMPREHENSIVE METABOLIC PANEL
ALT: 11 U/L (ref 0–53)
AST: 27 U/L (ref 0–37)
Albumin: 3.9 g/dL (ref 3.5–5.2)
Alkaline Phosphatase: 58 U/L (ref 39–117)
BUN: 18 mg/dL (ref 6–23)
CO2: 27 mEq/L (ref 19–32)
Calcium: 9.1 mg/dL (ref 8.4–10.5)
Chloride: 105 mEq/L (ref 96–112)
Creatinine, Ser: 1.1 mg/dL (ref 0.40–1.50)
GFR: 68.22 mL/min (ref >60.00)
Glucose, Bld: 92 mg/dL (ref 70–99)
Potassium: 3.8 mEq/L (ref 3.5–5.1)
Sodium: 139 mEq/L (ref 135–145)
Total Bilirubin: 0.9 mg/dL (ref 0.2–1.2)
Total Protein: 6.8 g/dL (ref 6.0–8.3)

## 2020-04-03 LAB — PSA: PSA: 2.39 ng/mL (ref 0.10–4.00)

## 2020-04-03 LAB — TSH: TSH: 5.47 u[IU]/mL — ABNORMAL HIGH (ref 0.35–4.50)

## 2020-04-03 NOTE — Assessment & Plan Note (Signed)
Chronic Check lipid panel  lifestyle controlled Regular exercise and healthy diet encouraged

## 2020-04-03 NOTE — Assessment & Plan Note (Signed)
Chronic  Clinically euthyroid Currently taking synthroid 125 mcg daily Check tsh  Titrate med dose if needed

## 2020-04-03 NOTE — Addendum Note (Signed)
Addended by: Marcina Millard on: 04/03/2020 01:38 PM   Modules accepted: Orders

## 2020-04-03 NOTE — Assessment & Plan Note (Signed)
Chronic Following with Dr Lovena Le once a year A/c not indicated asymptomatic Cbc, tsh, cmp

## 2020-04-05 MED ORDER — SYNTHROID 137 MCG PO TABS: 137.0000 ug | ORAL_TABLET | Freq: Every day | ORAL | 3 refills | Status: DC

## 2020-04-05 NOTE — Addendum Note (Signed)
Addended by: Binnie Rail on: 04/05/2020 07:47 AM   Modules accepted: Orders

## 2020-05-16 ENCOUNTER — Other Ambulatory Visit: Payer: Self-pay

## 2020-05-16 ENCOUNTER — Other Ambulatory Visit (INDEPENDENT_AMBULATORY_CARE_PROVIDER_SITE_OTHER): Payer: Medicare PPO

## 2020-05-16 DIAGNOSIS — E039 Hypothyroidism, unspecified: Secondary | ICD-10-CM

## 2020-05-16 LAB — TSH: TSH: 1.63 u[IU]/mL (ref 0.35–4.50)

## 2020-08-14 ENCOUNTER — Other Ambulatory Visit: Payer: Self-pay

## 2020-08-14 ENCOUNTER — Encounter: Payer: Self-pay | Admitting: Internal Medicine

## 2020-08-14 ENCOUNTER — Ambulatory Visit: Payer: Medicare PPO | Admitting: Internal Medicine

## 2020-08-14 VITALS — BP 120/80 | HR 76 | Ht 75.0 in | Wt 239.0 lb

## 2020-08-14 DIAGNOSIS — I482 Chronic atrial fibrillation, unspecified: Secondary | ICD-10-CM | POA: Diagnosis not present

## 2020-08-14 NOTE — Patient Instructions (Signed)

## 2020-08-14 NOTE — Progress Notes (Signed)
HPI Mr. Jenny returns today for followup of his atrial fib. He is a pleasant 70 yo man with chronic atrial fib and rate control. He has been CHADSVASC of 1 and not required systemic anti-coagulation. When I saw him a year ago his ave bp was in the low 120's. He remains active exercising.  No Known Allergies   Current Outpatient Medications  Medication Sig Dispense Refill  . Omega-3 Fatty Acids (FISH OIL PO) Take 2 capsules by mouth daily.    Marland Kitchen SYNTHROID 137 MCG tablet Take 1 tablet (137 mcg total) by mouth daily before breakfast. 90 tablet 3   No current facility-administered medications for this visit.     Past Medical History:  Diagnosis Date  . Adenomatous colon polyp   . Atrial fibrillation [facility]) 2012   Dr Cristopher Peru seen as OP  . Atrial fibrillation ([place]) 11/22-23/2013   presentation as nausea, dizziness & chest tightness  . Diverticulosis 2007  . Hyperlipidemia   . Thyroid disease    hypothyroidism    ROS:   All systems reviewed and negative except as noted in the HPI.   Past Surgical History:  Procedure Laterality Date  . CHEST TUBE INSERTION  1982   Spontaneous Pneumothorax  . COLONOSCOPY    . COLONOSCOPY W/ POLYPECTOMY  2007   Adenomatous Polyp; [facility], Dr Olevia Perches  . Holter monitor-48 hour  03/2011, 12/2010   Dr Crissie Sickles  . TONSILLECTOMY    . TRANSTHORACIC ECHOCARDIOGRAM   12/2010,      Family History  Problem Relation Age of Onset  . Stroke Mother 30       cns aneurysm bleed X2  . Breast cancer Mother   . Valvular heart disease Mother        valve replacement  . Hypertension Mother   . Cerebral aneurysm Sister 53  . Hypertension Father   . Hypertension Sister   . Diabetes Neg Hx   . Colon cancer Neg Hx   . Rectal cancer Neg Hx   . Stomach cancer Neg Hx      Social History   Socioeconomic History  . Marital status: Married    Spouse name: Not on file  . Number of children: 2  . Years of education: Not on file  .  Highest education level: Not on file  Occupational History  . Not on file  Tobacco Use  . Smoking status: Never Smoker  . Smokeless tobacco: Never Used  Vaping Use  . Vaping Use: Never used  Substance and Sexual Activity  . Alcohol use: No  . Drug use: No  . Sexual activity: Never  Other Topics Concern  . Not on file  Social History Narrative   Exercise: 5 days of aerobic, two days of weights   Social Determinants of Health   Financial Resource Strain: Low Risk   . Difficulty of Paying Living Expenses: Not hard at all  Food Insecurity: No Food Insecurity  . Worried About Charity fundraiser in the Last Year: Never true  . Ran Out of Food in the Last Year: Never true  Transportation Needs: Not on file  Physical Activity: Sufficiently Active  . Days of Exercise per Week: 7 days  . Minutes of Exercise per Session: 60 min  Stress: No Stress Concern Present  . Feeling of Stress : Not at all  Social Connections: Socially Integrated  . Frequency of Communication with Friends and Family: More than three times a  week  . Frequency of Social Gatherings with Friends and Family: More than three times a week  . Attends Religious Services: More than 4 times per year  . Active Member of Clubs or Organizations: Yes  . Attends Archivist Meetings: More than 4 times per year  . Marital Status: Married  Human resources officer Violence: Not on file     Ht 6\' 3"  (1.905 m)   BMI 30.87 kg/m   Physical Exam:  Well appearing NAD HEENT: Unremarkable Neck:  No JVD, no thyromegally Lymphatics:  No adenopathy Back:  No CVA tenderness Lungs:  Clear HEART:  Regular rate rhythm, no murmurs, no rubs, no clicks Abd:  soft, positive bowel sounds, no organomegally, no rebound, no guarding Ext:  2 plus pulses, no edema, no cyanosis, no clubbing Skin:  No rashes no nodules Neuro:  CN II through XII intact, motor grossly intact  EKG  DEVICE  Normal device function.  See PaceArt for details.    Assess/Plan: 1. Atrial fib - his CHADSVASC score is 1. He will continue ASA. His VR is well controlled.  2. Thyroid dysfunction - he will continue synthroid. He is clinically euvolemic. 3. Obesity - he has lost 20 lbs since his last visit.  Carleene Overlie Taylor,MD

## 2020-11-23 ENCOUNTER — Telehealth (INDEPENDENT_AMBULATORY_CARE_PROVIDER_SITE_OTHER): Payer: Medicare PPO | Admitting: Family Medicine

## 2020-11-23 DIAGNOSIS — R0981 Nasal congestion: Secondary | ICD-10-CM | POA: Diagnosis not present

## 2020-11-23 MED ORDER — AMOXICILLIN-POT CLAVULANATE 875-125 MG PO TABS: 1.0000 | ORAL_TABLET | Freq: Two times a day (BID) | ORAL | 0 refills | Status: DC

## 2020-11-23 MED ORDER — BENZONATATE 100 MG PO CAPS: 100.0000 mg | ORAL_CAPSULE | Freq: Three times a day (TID) | ORAL | 0 refills | Status: DC | PRN

## 2020-11-23 NOTE — Patient Instructions (Signed)
-  I sent the medication(s) we discussed to your pharmacy: Meds ordered this encounter  Medications   amoxicillin-clavulanate (AUGMENTIN) 875-125 MG tablet    Sig: Take 1 tablet by mouth 2 (two) times daily.    Dispense:  20 tablet    Refill:  0   benzonatate (TESSALON PERLES) 100 MG capsule    Sig: Take 1 capsule (100 mg total) by mouth 3 (three) times daily as needed.    Dispense:  20 capsule    Refill:  0   Start with some nasal saline, the Afrin nasal spray for 3 days and the cough medication. Hopefully, you will be feeling better with those treatments. If not, can star the antibiotic (Augmentin.)  I hope you are feeling better soon!  Seek in person care promptly if your symptoms worsen, new concerns arise or you are not improving with treatment.  It was nice to meet you today. I help [facility] out with telemedicine visits on Tuesdays and Thursdays and am available for visits on those days. If you have any concerns or questions following this visit please schedule a follow up visit with your Primary Care doctor or seek care at a local urgent care clinic to avoid delays in care.

## 2020-11-23 NOTE — Progress Notes (Signed)
Virtual Visit via Video Note  I connected with Elijah [name]  on 11/23/20 at 10:40 AM EDT by a video enabled telemedicine application and verified that I am speaking with the correct person using two identifiers.  Location patient: home, [place] Location provider:work or home office Persons participating in the virtual visit: patient, provider, wife Santiago Glad  I discussed the limitations of evaluation and management by telemedicine and the availability of in person appointments. The patient expressed understanding and agreed to proceed.   HPI:  Acute telemedicine visit for congestion and cough: -Onset: 2 weeks ago -had 3 negative covid tests -Symptoms include: congestion, thick and discolored mucus, cough, headache/sinus discomfort -Denies: fevers, body aches, CP, SOB, NVD, inability to eat/drink/get out of bed -Has tried:musinex, cough drop -no known sick contacts -Pertinent past medical history: denies asthma or lung disease -Pertinent medication allergies: No Known Allergies -COVID-19 vaccine status: vaccinated and had  boosters; had flu shot as well  ROS: See pertinent positives and negatives per HPI.  Past Medical History:  Diagnosis Date   Adenomatous colon polyp    Atrial fibrillation [facility]) 2012   Dr Cristopher Peru seen as OP   Atrial fibrillation ([place]) 11/22-23/2013   presentation as nausea, dizziness & chest tightness   Diverticulosis 2007   Hyperlipidemia    Thyroid disease    hypothyroidism    Past Surgical History:  Procedure Laterality Date   CHEST TUBE INSERTION  1982   Spontaneous Pneumothorax   COLONOSCOPY     COLONOSCOPY W/ POLYPECTOMY  2007   Adenomatous Polyp; [facility], Dr Olevia Perches   Holter monitor-48 hour  03/2011, 12/2010   Dr Crissie Sickles   TONSILLECTOMY     TRANSTHORACIC ECHOCARDIOGRAM   12/2010,      Current Outpatient Medications:    amoxicillin-clavulanate (AUGMENTIN) 875-125 MG tablet, Take 1 tablet by mouth 2 (two) times daily., Disp: 20 tablet, Rfl: 0    benzonatate (TESSALON PERLES) 100 MG capsule, Take 1 capsule (100 mg total) by mouth 3 (three) times daily as needed., Disp: 20 capsule, Rfl: 0   Omega-3 Fatty Acids (FISH OIL PO), Take 2 capsules by mouth daily., Disp: , Rfl:    SYNTHROID 137 MCG tablet, Take 1 tablet (137 mcg total) by mouth daily before breakfast., Disp: 90 tablet, Rfl: 3  EXAM:  VITALS per patient if applicable:  GENERAL: alert, oriented, appears well and in no acute distress  HEENT: atraumatic, conjunttiva clear, no obvious abnormalities on inspection of external nose and ears  NECK: normal movements of the head and neck  LUNGS: on inspection no signs of respiratory distress, breathing rate appears normal, no obvious gross SOB, gasping or wheezing  CV: no obvious cyanosis  MS: moves all visible extremities without noticeable abnormality  PSYCH/NEURO: pleasant and cooperative, no obvious depression or anxiety, speech and thought processing grossly intact  ASSESSMENT AND PLAN:  Discussed the following assessment and plan:  Nasal sinus congestion  -we discussed possible serious and likely etiologies, options for evaluation and workup, limitations of telemedicine visit vs in person visit, treatment, treatment risks and precautions. Pt prefers to treat via telemedicine empirically rather than in person at this moment. Query resolving VURI, sinusitis vs other. Opted for trial of nasal saline, short course of nasal decongestant and Tessalon rx for cough. If worsening or not resolving with these measures he did wish to have and antibiotic rx sent in in case needs it.  Work/School slipped offered:declined Advised to seek prompt in person care if worsening, new symptoms arise,  or if is not improving with treatment. Discussed options for inperson care if PCP office not available. Did let this patient know that I only do telemedicine on Tuesdays and Thursdays for [place]. Advised to schedule follow up visit with PCP or [facility] if  any further questions or concerns to avoid delays in care.   I discussed the assessment and treatment plan with the patient. The patient was provided an opportunity to ask questions and all were answered. The patient agreed with the plan and demonstrated an understanding of the instructions.     Lucretia Kern, DO

## 2020-12-15 DIAGNOSIS — Z20822 Contact with and (suspected) exposure to covid-19: Secondary | ICD-10-CM | POA: Diagnosis not present

## 2020-12-21 ENCOUNTER — Encounter: Payer: Self-pay | Admitting: Internal Medicine

## 2020-12-21 NOTE — Progress Notes (Signed)
Subjective:    Patient ID: Elijah [name], male    DOB: 1951/02/01, 70 y.o.   MRN: [id number]  HPI He is here for an acute visit for cold symptoms.  His symptoms started end of June.  He was sick in May and took an antibiotic, but recovered from that.  He is experiencing chills, sore throat, nasal congestion, right ear pain with dec hearing, sinus pain, cough that is occ productive of brown sputum and wheeze.   He has tried taking mucinex, ibuprofen.    3 neg covid tests   Medications and allergies reviewed with patient and updated if appropriate.  Patient Active Problem List   Diagnosis Date Noted   Superficial thrombophlebitis 05/26/2019   Onychomycosis of great toe 03/21/2018   Numbness 03/21/2018   Adenomatous polyp of colon 11/22/2013   Atrial fibrillation ([place]) 12/31/2010   Hyperlipidemia 04/26/2008   Hypothyroidism 02/19/2007    Current Outpatient Medications on File Prior to Visit  Medication Sig Dispense Refill   Omega-3 Fatty Acids (FISH OIL PO) Take 2 capsules by mouth daily.     SYNTHROID 137 MCG tablet Take 1 tablet (137 mcg total) by mouth daily before breakfast. 90 tablet 3   No current facility-administered medications on file prior to visit.    Past Medical History:  Diagnosis Date   Adenomatous colon polyp    Atrial fibrillation ([place]) 2012   Dr Cristopher Peru seen as OP   Atrial fibrillation ([place]) 11/22-23/2013   presentation as nausea, dizziness & chest tightness   Diverticulosis 2007   Hyperlipidemia    Thyroid disease    hypothyroidism    Past Surgical History:  Procedure Laterality Date   CHEST TUBE INSERTION  1982   Spontaneous Pneumothorax   COLONOSCOPY     COLONOSCOPY W/ POLYPECTOMY  2007   Adenomatous Polyp; [facility], Dr Olevia Perches   Holter monitor-48 hour  03/2011, 12/2010   Dr Crissie Sickles   TONSILLECTOMY     TRANSTHORACIC ECHOCARDIOGRAM   12/2010,     Social History   Socioeconomic History   Marital status: Married     Spouse name: Not on file   Number of children: 2   Years of education: Not on file   Highest education level: Not on file  Occupational History   Not on file  Tobacco Use   Smoking status: Never   Smokeless tobacco: Never  Vaping Use   Vaping Use: Never used  Substance and Sexual Activity   Alcohol use: No   Drug use: No   Sexual activity: Never  Other Topics Concern   Not on file  Social History Narrative   Exercise: 5 days of aerobic, two days of weights   Social Determinants of Health   Financial Resource Strain: Low Risk    Difficulty of Paying Living Expenses: Not hard at all  Food Insecurity: No Food Insecurity   Worried About Charity fundraiser in the Last Year: Never true   Ran Out of Food in the Last Year: Never true  Transportation Needs: Not on file  Physical Activity: Sufficiently Active   Days of Exercise per Week: 7 days   Minutes of Exercise per Session: 60 min  Stress: No Stress Concern Present   Feeling of Stress : Not at all  Social Connections: Socially Integrated   Frequency of Communication with Friends and Family: More than three times a week   Frequency of Social Gatherings with Friends and Family: More than three  times a week   Attends Religious Services: More than 4 times per year   Active Member of Clubs or Organizations: Yes   Attends Music therapist: More than 4 times per year   Marital Status: Married    Family History  Problem Relation Age of Onset   Stroke Mother 15       cns aneurysm bleed X2   Breast cancer Mother    Valvular heart disease Mother        valve replacement   Hypertension Mother    Cerebral aneurysm Sister 60   Hypertension Father    Hypertension Sister    Diabetes Neg Hx    Colon cancer Neg Hx    Rectal cancer Neg Hx    Stomach cancer Neg Hx     Review of Systems  Constitutional:  Positive for chills. Negative for fever.  HENT:  Positive for congestion, ear pain, sinus pressure, sinus pain and  sore throat.   Respiratory:  Positive for cough (productive of brown sputum) and wheezing. Negative for chest tightness and shortness of breath.   Musculoskeletal:  Positive for myalgias.  Neurological:  Positive for headaches.      Objective:   Vitals:   12/22/20 1449  BP: 120/76  Pulse: 68  Temp: 98.4 F (36.9 C)  SpO2: 98%   BP Readings from Last 3 Encounters:  12/22/20 120/76  08/14/20 120/80  04/03/20 126/78   Wt Readings from Last 3 Encounters:  12/22/20 243 lb (110.2 kg)  08/14/20 239 lb (108.4 kg)  04/03/20 247 lb (112 kg)   Body mass index is 30.37 kg/m.   Physical Exam    GENERAL APPEARANCE: Appears stated age, well appearing, NAD EYES: conjunctiva clear, no icterus HENT: left ear canal with moderate cerumen but otherwise normal, right ear canal with scant cerumen, right TM with erythema and dull, oropharynx with no erythema or exudates, trachea midline, no cervical or supraclavicular lymphadenopathy LUNGS: Unlabored breathing, good air entry bilaterally, clear to auscultation without wheeze or crackles CARDIOVASCULAR: Normal S1,S2 , no edema SKIN: Warm, dry      Assessment & Plan:    See Problem List for Assessment and Plan of chronic medical problems.    This visit occurred during the SARS-CoV-2 public health emergency.  Safety protocols were in place, including screening questions prior to the visit, additional usage of staff PPE, and extensive cleaning of exam room while observing appropriate contact time as indicated for disinfecting solutions.

## 2020-12-22 ENCOUNTER — Other Ambulatory Visit: Payer: Self-pay

## 2020-12-22 ENCOUNTER — Ambulatory Visit: Payer: Medicare PPO | Admitting: Internal Medicine

## 2020-12-22 ENCOUNTER — Encounter: Payer: Self-pay | Admitting: Internal Medicine

## 2020-12-22 DIAGNOSIS — H6591 Unspecified nonsuppurative otitis media, right ear: Secondary | ICD-10-CM | POA: Diagnosis not present

## 2020-12-22 DIAGNOSIS — J019 Acute sinusitis, unspecified: Secondary | ICD-10-CM | POA: Insufficient documentation

## 2020-12-22 DIAGNOSIS — H6690 Otitis media, unspecified, unspecified ear: Secondary | ICD-10-CM | POA: Insufficient documentation

## 2020-12-22 DIAGNOSIS — J0100 Acute maxillary sinusitis, unspecified: Secondary | ICD-10-CM | POA: Diagnosis not present

## 2020-12-22 MED ORDER — AMOXICILLIN-POT CLAVULANATE 875-125 MG PO TABS: 1.0000 | ORAL_TABLET | Freq: Two times a day (BID) | ORAL | 0 refills | Status: DC

## 2020-12-22 MED ORDER — HYDROCODONE BIT-HOMATROP MBR 5-1.5 MG/5ML PO SOLN: 5.0000 mL | Freq: Four times a day (QID) | ORAL | 0 refills | Status: DC | PRN

## 2020-12-22 NOTE — Assessment & Plan Note (Signed)
Acute Likely bacterial  Start Augmentin 875-125 mg BID x 10 day Hydromet cough syrup 5 mL every 6 hours as needed otc cold medications Rest, fluid Call if no improvement

## 2020-12-22 NOTE — Patient Instructions (Addendum)
    Medications changes include :   augmetin twice daily for 10 days.  Cough syrup.     Your prescription(s) have been submitted to your pharmacy. Please take as directed and contact our office if you believe you are having problem(s) with the medication(s).

## 2020-12-22 NOTE — Assessment & Plan Note (Signed)
Acute Findings on exam consistent with otitis media Start Augmentin 875-125 mg twice daily x10 days Can continue ibuprofen as needed

## 2020-12-25 DIAGNOSIS — J343 Hypertrophy of nasal turbinates: Secondary | ICD-10-CM | POA: Diagnosis not present

## 2020-12-25 DIAGNOSIS — H6122 Impacted cerumen, left ear: Secondary | ICD-10-CM | POA: Diagnosis not present

## 2020-12-25 DIAGNOSIS — D164 Benign neoplasm of bones of skull and face: Secondary | ICD-10-CM | POA: Diagnosis not present

## 2020-12-25 DIAGNOSIS — H938X1 Other specified disorders of right ear: Secondary | ICD-10-CM | POA: Diagnosis not present

## 2021-03-13 DIAGNOSIS — H2513 Age-related nuclear cataract, bilateral: Secondary | ICD-10-CM | POA: Diagnosis not present

## 2021-03-13 DIAGNOSIS — H5213 Myopia, bilateral: Secondary | ICD-10-CM | POA: Diagnosis not present

## 2021-03-13 DIAGNOSIS — H21232 Degeneration of iris (pigmentary), left eye: Secondary | ICD-10-CM | POA: Diagnosis not present

## 2021-04-02 ENCOUNTER — Ambulatory Visit (INDEPENDENT_AMBULATORY_CARE_PROVIDER_SITE_OTHER): Payer: Medicare PPO

## 2021-04-02 VITALS — BP 124/70 | HR 80 | Temp 98.3°F | Ht 75.0 in | Wt 252.6 lb

## 2021-04-02 DIAGNOSIS — Z0000 Encounter for general adult medical examination without abnormal findings: Secondary | ICD-10-CM | POA: Diagnosis not present

## 2021-04-02 NOTE — Progress Notes (Signed)
Subjective:   Elijah [name] is a 70 y.o. male who presents for Medicare Annual/Subsequent preventive examination.  Review of Systems     Cardiac Risk Factors include: advanced age (>57men, >4 women);dyslipidemia;family history of premature cardiovascular disease;male gender;obesity (BMI >30kg/m2)     Objective:    Today's Vitals   04/02/21 0909  BP: 124/70  Pulse: 80  Temp: 98.3 F (36.8 C)  SpO2: 97%  Weight: 252 lb 9.6 oz (114.6 kg)  Height: 6\' 3"  (1.905 m)  PainSc: 0-No pain   Body mass index is 31.57 kg/m.  Advanced Directives 04/02/2021 03/31/2020 05/23/2019 03/26/2019 03/20/2018 04/06/2015 03/23/2015  Does Patient Have a Medical Advance Directive? Yes Yes Yes Yes Yes Yes Yes  Type of Advance Directive Living will;Healthcare Power of Attorney Living will;Healthcare Power of [place];Living will [place];Living will [place];Living will - [place];Living will  Does patient want to make changes to medical advance directive? No - Patient declined No - Patient declined - - - - No - Patient declined  Copy of [place] in Chart? No - copy requested No - copy requested - No - copy requested No - copy requested - No - copy requested  Pre-existing out of facility DNR order (yellow form or pink MOST form) - - - - - - -    Current Medications (verified) Outpatient Encounter Medications as of 04/02/2021  Medication Sig   Cholecalciferol (VITAMIN D3) 50 MCG (2000 UT) TABS Take 50 mcg by mouth once.   Omega-3 Fatty Acids (FISH OIL PO) Take 2 capsules by mouth daily.   SYNTHROID 137 MCG tablet Take 1 tablet (137 mcg total) by mouth daily before breakfast.   [DISCONTINUED] amoxicillin-clavulanate (AUGMENTIN) 875-125 MG tablet Take 1 tablet by mouth 2 (two) times daily.   [DISCONTINUED] HYDROcodone bit-homatropine (HYDROMET) 5-1.5 MG/5ML syrup Take 5 mLs by mouth every 6 (six)  hours as needed for cough.   No facility-administered encounter medications on file as of 04/02/2021.    Allergies (verified) Patient has no known allergies.   History: Past Medical History:  Diagnosis Date   Adenomatous colon polyp    Atrial fibrillation ([place]) 2012   Dr Cristopher Peru seen as OP   Atrial fibrillation ([place]) 11/22-23/2013   presentation as nausea, dizziness & chest tightness   Diverticulosis 2007   Hyperlipidemia    Thyroid disease    hypothyroidism   Past Surgical History:  Procedure Laterality Date   CHEST TUBE INSERTION  1982   Spontaneous Pneumothorax   COLONOSCOPY     COLONOSCOPY W/ POLYPECTOMY  2007   Adenomatous Polyp; [facility], Dr Olevia Perches   Holter monitor-48 hour  03/2011, 12/2010   Dr Crissie Sickles   TONSILLECTOMY     TRANSTHORACIC ECHOCARDIOGRAM   12/2010,    Family History  Problem Relation Age of Onset   Stroke Mother 50       cns aneurysm bleed X2   Breast cancer Mother    Valvular heart disease Mother        valve replacement   Hypertension Mother    Cerebral aneurysm Sister 31   Hypertension Father    Hypertension Sister    Diabetes Neg Hx    Colon cancer Neg Hx    Rectal cancer Neg Hx    Stomach cancer Neg Hx    Social History   Socioeconomic History   Marital status: Married    Spouse name: Not  on file   Number of children: 2   Years of education: Not on file   Highest education level: Not on file  Occupational History   Not on file  Tobacco Use   Smoking status: Never   Smokeless tobacco: Never  Vaping Use   Vaping Use: Never used  Substance and Sexual Activity   Alcohol use: No   Drug use: No   Sexual activity: Never  Other Topics Concern   Not on file  Social History Narrative   Exercise: 5 days of aerobic, two days of weights   Social Determinants of Health   Financial Resource Strain: Low Risk    Difficulty of Paying Living Expenses: Not hard at all  Food Insecurity: No Food Insecurity   Worried About  Charity fundraiser in the Last Year: Never true   Ran Out of Food in the Last Year: Never true  Transportation Needs: No Transportation Needs   Lack of Transportation (Medical): No   Lack of Transportation (Non-Medical): No  Physical Activity: Sufficiently Active   Days of Exercise per Week: 7 days   Minutes of Exercise per Session: 40 min  Stress: No Stress Concern Present   Feeling of Stress : Not at all  Social Connections: Socially Integrated   Frequency of Communication with Friends and Family: More than three times a week   Frequency of Social Gatherings with Friends and Family: More than three times a week   Attends Religious Services: More than 4 times per year   Active Member of [employer] or Organizations: Yes   Attends Music therapist: More than 4 times per year   Marital Status: Married    Tobacco Counseling Counseling given: Not Answered   Clinical Intake:  Pre-visit preparation completed: Yes  Pain : No/denies pain Pain Score: 0-No pain     BMI - recorded: 31.57 Nutritional Status: BMI > 30  Obese Nutritional Risks: None Diabetes: No  How often do you need to have someone help you when you read instructions, pamphlets, or other written materials from your doctor or pharmacy?: 1 - Never What is the last grade level you completed in school?: Graduate School  Diabetic? no  Interpreter Needed?: No  Information entered by :: [name], LPN   Activities of Daily Living In your present state of health, do you have any difficulty performing the following activities: 04/02/2021  Hearing? N  Vision? N  Difficulty concentrating or making decisions? N  Walking or climbing stairs? N  Dressing or bathing? N  Doing errands, shopping? N  Preparing Food and eating ? N  Using the Toilet? N  In the past six months, have you accidently leaked urine? N  Do you have problems with loss of bowel control? N  Managing your Medications? N  Managing your  Finances? N  Housekeeping or managing your Housekeeping? N  Some recent data might be hidden    Patient Care Team: Binnie Rail, MD as PCP - General (Internal Medicine) Armbruster, Carlota Raspberry, MD as Consulting Physician (Gastroenterology) Evans Lance, MD as Consulting Physician (Cardiology) Luberta Mutter, MD as Consulting Physician (Ophthalmology)  Indicate any recent Medical Services you may have received from other than [facility] providers in the past year (date may be approximate).     Assessment:   This is a routine wellness examination for Evren.  Hearing/Vision screen No results found.  Dietary issues and exercise activities discussed: Current Exercise Habits: Home exercise routine, Type of exercise:  walking;treadmill;stretching;strength training/weights, Time (Minutes): 40, Frequency (Times/Week): 7, Weekly Exercise (Minutes/Week): 280, Intensity: Moderate, Exercise limited by: None identified   Goals Addressed               This Visit's Progress     Patient Stated (pt-stated)        Continue to keep my blood pressure down and stay physically active everyday for at least 30-45 minutes.      Depression Screen PHQ 2/9 Scores 04/02/2021 03/31/2020 03/26/2019 03/20/2018 03/14/2017 10/06/2015 09/27/2015  PHQ - 2 Score 0 0 0 0 0 0 0  PHQ- 9 Score - - - 0 - - -    Fall Risk Fall Risk  04/02/2021 03/31/2020 03/26/2019 03/26/2019 03/20/2018  Falls in the past year? 0 0 0 0 No  Number falls in past yr: 0 0 0 0 -  Injury with Fall? 0 0 0 - -  Risk for fall due to : No Fall Risks No Fall Risks - - -  Follow up Falls evaluation completed Falls evaluation completed - - -    FALL RISK PREVENTION PERTAINING TO THE HOME:  Any stairs in or around the home? Yes  If so, are there any without handrails? No  Home free of loose throw rugs in walkways, pet beds, electrical cords, etc? Yes  Adequate lighting in your home to reduce risk of falls? Yes   ASSISTIVE DEVICES UTILIZED TO  PREVENT FALLS:  Life alert? Yes (Apple Watch) Use of a cane, walker or w/c? No  Grab bars in the bathroom? No  Shower chair or bench in shower? No  Elevated toilet seat or a handicapped toilet? No   TIMED UP AND GO:  Was the test performed? Yes .  Length of time to ambulate 10 feet: 5 sec.   Gait steady and fast without use of assistive device  Cognitive Function: Normal cognitive status assessed by direct observation by this Nurse Health Advisor. No abnormalities found.          Immunizations Immunization History  Administered Date(s) Administered   Fluad Quad(high Dose 65+) 03/13/2019, 04/03/2020   Influenza Split 05/07/2012   Influenza, High Dose Seasonal PF 03/14/2017, 03/20/2018   Influenza-Unspecified 04/24/2016   PFIZER(Purple Top)SARS-COV-2 Vaccination 07/07/2019, 07/28/2019, 03/15/2020   Pneumococcal Conjugate-13 03/14/2017   Pneumococcal Polysaccharide-23 03/20/2018   Tdap 01/15/2005, 12/25/2015   Zoster, Live 03/31/2012    TDAP status: Up to date  Flu Vaccine status: Due, Education has been provided regarding the importance of this vaccine. Advised may receive this vaccine at local pharmacy or Health Dept. Aware to provide a copy of the vaccination record if obtained from local pharmacy or Health Dept. Verbalized acceptance and understanding.  Pneumococcal vaccine status: Up to date  Covid-19 vaccine status: Completed vaccines  Qualifies for Shingles Vaccine? Yes   Zostavax completed Yes   Shingrix Completed?: No.    Education has been provided regarding the importance of this vaccine. Patient has been advised to call insurance company to determine out of pocket expense if they have not yet received this vaccine. Advised may also receive vaccine at local pharmacy or Health Dept. Verbalized acceptance and understanding.  Screening Tests Health Maintenance  Topic Date Due   Zoster Vaccines- Shingrix (1 of 2) Never done   COLONOSCOPY (Pts 45-63yrs Insurance  coverage will need to be confirmed)  04/05/2018   COVID-19 Vaccine (4 - Booster for Pfizer series) 07/15/2020   INFLUENZA VACCINE  01/15/2021   TETANUS/TDAP  12/24/2025   Hepatitis  C Screening  Completed   HPV VACCINES  Aged Out    Health Maintenance  Health Maintenance Due  Topic Date Due   Zoster Vaccines- Shingrix (1 of 2) Never done   COLONOSCOPY (Pts 45-80yrs Insurance coverage will need to be confirmed)  04/05/2018   COVID-19 Vaccine (4 - Booster for Pfizer series) 07/15/2020   INFLUENZA VACCINE  01/15/2021    Colorectal cancer screening: Type of screening: Colonoscopy. Completed 04/06/2015. Repeat every 3 years  Lung Cancer Screening: (Low Dose CT Chest recommended if Age 20-80 years, 30 pack-year currently smoking OR have quit w/in 15years.) does not qualify.   Lung Cancer Screening Referral: no  Additional Screening:  Hepatitis C Screening: does qualify; Completed yes  Vision Screening: Recommended annual ophthalmology exams for early detection of glaucoma and other disorders of the eye. Is the patient up to date with their annual eye exam?  Yes  Who is the provider or what is the name of the office in which the patient attends annual eye exams? Luberta Mutter, MD. If pt is not established with a provider, would they like to be referred to a provider to establish care? No .   Dental Screening: Recommended annual dental exams for proper oral hygiene  Community Resource Referral / Chronic Care Management: CRR required this visit?  No   CCM required this visit?  No      Plan:     I have personally reviewed and noted the following in the patient's chart:   Medical and social history Use of alcohol, tobacco or illicit drugs  Current medications and supplements including opioid prescriptions. Patient is not currently taking opioid prescriptions. Functional ability and status Nutritional status Physical activity Advanced directives List of other  physicians Hospitalizations, surgeries, and ER visits in previous 12 months Vitals Screenings to include cognitive, depression, and falls Referrals and appointments  In addition, I have reviewed and discussed with patient certain preventive protocols, quality metrics, and best practice recommendations. A written personalized care plan for preventive services as well as general preventive health recommendations were provided to patient.     Sheral Flow, LPN   14/48/1856   Nurse Notes:  Hearing Screening - Comments:: Patient denied any hearing difficulty.   No hearing aids.  Vision Screening - Comments:: Patient wears contacts.  Eye exam done annually by: Luberta Mutter, MD.

## 2021-04-02 NOTE — Patient Instructions (Signed)
Elijah [name] , Thank you for taking time to come for your Medicare Wellness Visit. I appreciate your ongoing commitment to your health goals. Please review the following plan we discussed and let me know if I can assist you in the future.   Screening recommendations/referrals: Colonoscopy: last done 04/06/2015; due every 3 years Recommended yearly ophthalmology/optometry visit for glaucoma screening and checkup Recommended yearly dental visit for hygiene and checkup  Vaccinations: Influenza vaccine: due Fall 2022 Pneumococcal vaccine: 03/14/2017, 03/20/2018 Tdap vaccine: 12/25/2015; due every 10 years Shingles vaccine: never done   Covid-19: 07/07/2019, 07/28/2019, 03/15/2020  Advanced directives: Please bring a copy of your health care power of attorney and living will to the office at your convenience.  Conditions/risks identified: Yes; Client understands the importance of follow-up with providers by attending scheduled visits and discussed goals to eat healthier, increase physical activity, exercise the brain, socialize more, get enough sleep and make time for laughter.  Next appointment: Please schedule your next Medicare Wellness Visit with your Nurse Health Advisor in 1 year by calling [phone].  Preventive Care 67 Years and Older, Male Preventive care refers to lifestyle choices and visits with your health care provider that can promote health and wellness. What does preventive care include? A yearly physical exam. This is also called an annual well check. Dental exams once or twice a year. Routine eye exams. Ask your health care provider how often you should have your eyes checked. Personal lifestyle choices, including: Daily care of your teeth and gums. Regular physical activity. Eating a healthy diet. Avoiding tobacco and drug use. Limiting alcohol use. Practicing safe sex. Taking low doses of aspirin every day. Taking vitamin and mineral supplements as recommended by your  health care provider. What happens during an annual well check? The services and screenings done by your health care provider during your annual well check will depend on your age, overall health, lifestyle risk factors, and family history of disease. Counseling  Your health care provider may ask you questions about your: Alcohol use. Tobacco use. Drug use. Emotional well-being. Home and relationship well-being. Sexual activity. Eating habits. History of falls. Memory and ability to understand (cognition). Work and work Statistician. Screening  You may have the following tests or measurements: Height, weight, and BMI. Blood pressure. Lipid and cholesterol levels. These may be checked every 5 years, or more frequently if you are over 47 years old. Skin check. Lung cancer screening. You may have this screening every year starting at age 35 if you have a 30-pack-year history of smoking and currently smoke or have quit within the past 15 years. Fecal occult blood test (FOBT) of the stool. You may have this test every year starting at age 73. Flexible sigmoidoscopy or colonoscopy. You may have a sigmoidoscopy every 5 years or a colonoscopy every 10 years starting at age 73. Prostate cancer screening. Recommendations will vary depending on your family history and other risks. Hepatitis C blood test. Hepatitis B blood test. Sexually transmitted disease (STD) testing. Diabetes screening. This is done by checking your blood sugar (glucose) after you have not eaten for a while (fasting). You may have this done every 1-3 years. Abdominal aortic aneurysm (AAA) screening. You may need this if you are a current or former smoker. Osteoporosis. You may be screened starting at age 44 if you are at high risk. Talk with your health care provider about your test results, treatment options, and if necessary, the need for more tests. Vaccines  Your health  care provider may recommend certain vaccines, such  as: Influenza vaccine. This is recommended every year. Tetanus, diphtheria, and acellular pertussis (Tdap, Td) vaccine. You may need a Td booster every 10 years. Zoster vaccine. You may need this after age 50. Pneumococcal 13-valent conjugate (PCV13) vaccine. One dose is recommended after age 68. Pneumococcal polysaccharide (PPSV23) vaccine. One dose is recommended after age 53. Talk to your health care provider about which screenings and vaccines you need and how often you need them. This information is not intended to replace advice given to you by your health care provider. Make sure you discuss any questions you have with your health care provider. Document Released: 06/30/2015 Document Revised: 02/21/2016 Document Reviewed: 04/04/2015 Elsevier Interactive Patient Education  2017 [place] Prevention in the Home Falls can cause injuries. They can happen to people of all ages. There are many things you can do to make your home safe and to help prevent falls. What can I do on the outside of my home? Regularly fix the edges of walkways and driveways and fix any cracks. Remove anything that might make you trip as you walk through a door, such as a raised step or threshold. Trim any bushes or trees on the path to your home. Use bright outdoor lighting. Clear any walking paths of anything that might make someone trip, such as rocks or tools. Regularly check to see if handrails are loose or broken. Make sure that both sides of any steps have handrails. Any raised decks and porches should have guardrails on the edges. Have any leaves, snow, or ice cleared regularly. Use sand or salt on walking paths during winter. Clean up any spills in your garage right away. This includes oil or grease spills. What can I do in the bathroom? Use night lights. Install grab bars by the toilet and in the tub and shower. Do not use towel bars as grab bars. Use non-skid mats or decals in the tub or  shower. If you need to sit down in the shower, use a plastic, non-slip stool. Keep the floor dry. Clean up any water that spills on the floor as soon as it happens. Remove soap buildup in the tub or shower regularly. Attach bath mats securely with double-sided non-slip rug tape. Do not have throw rugs and other things on the floor that can make you trip. What can I do in the bedroom? Use night lights. Make sure that you have a light by your bed that is easy to reach. Do not use any sheets or blankets that are too big for your bed. They should not hang down onto the floor. Have a firm chair that has side arms. You can use this for support while you get dressed. Do not have throw rugs and other things on the floor that can make you trip. What can I do in the kitchen? Clean up any spills right away. Avoid walking on wet floors. Keep items that you use a lot in easy-to-reach places. If you need to reach something above you, use a strong step stool that has a grab bar. Keep electrical cords out of the way. Do not use floor polish or wax that makes floors slippery. If you must use wax, use non-skid floor wax. Do not have throw rugs and other things on the floor that can make you trip. What can I do with my stairs? Do not leave any items on the stairs. Make sure that there are handrails on  both sides of the stairs and use them. Fix handrails that are broken or loose. Make sure that handrails are as long as the stairways. Check any carpeting to make sure that it is firmly attached to the stairs. Fix any carpet that is loose or worn. Avoid having throw rugs at the top or bottom of the stairs. If you do have throw rugs, attach them to the floor with carpet tape. Make sure that you have a light switch at the top of the stairs and the bottom of the stairs. If you do not have them, ask someone to add them for you. What else can I do to help prevent falls? Wear shoes that: Do not have high heels. Have  rubber bottoms. Are comfortable and fit you well. Are closed at the toe. Do not wear sandals. If you use a stepladder: Make sure that it is fully opened. Do not climb a closed stepladder. Make sure that both sides of the stepladder are locked into place. Ask someone to hold it for you, if possible. Clearly mark and make sure that you can see: Any grab bars or handrails. First and last steps. Where the edge of each step is. Use tools that help you move around (mobility aids) if they are needed. These include: Canes. Walkers. Scooters. Crutches. Turn on the lights when you go into a dark area. Replace any light bulbs as soon as they burn out. Set up your furniture so you have a clear path. Avoid moving your furniture around. If any of your floors are uneven, fix them. If there are any pets around you, be aware of where they are. Review your medicines with your doctor. Some medicines can make you feel dizzy. This can increase your chance of falling. Ask your doctor what other things that you can do to help prevent falls. This information is not intended to replace advice given to you by your health care provider. Make sure you discuss any questions you have with your health care provider. Document Released: 03/30/2009 Document Revised: 11/09/2015 Document Reviewed: 07/08/2014 Elsevier Interactive Patient Education  2017 [employer].

## 2021-04-03 ENCOUNTER — Encounter: Payer: Self-pay | Admitting: Internal Medicine

## 2021-04-03 NOTE — Progress Notes (Signed)
Subjective:    Patient ID: Elijah [name], male    DOB: 1950-10-20, 69 y.o.   MRN: [id number]   This visit occurred during the SARS-CoV-2 public health emergency.  Safety protocols were in place, including screening questions prior to the visit, additional usage of staff PPE, and extensive cleaning of exam room while observing appropriate contact time as indicated for disinfecting solutions.   HPI He is here for a physical exam.   He denies any major changes in his health.  He has no concerns.    Medications and allergies reviewed with patient and updated if appropriate.  Patient Active Problem List   Diagnosis Date Noted   Superficial thrombophlebitis 05/26/2019   Onychomycosis of great toe 03/21/2018   Numbness 03/21/2018   Adenomatous polyp of colon 11/22/2013   Atrial fibrillation (HCC) - Dr Lovena Le 12/31/2010   Hyperlipidemia 04/26/2008   Hypothyroidism 02/19/2007    Current Outpatient Medications on File Prior to Visit  Medication Sig Dispense Refill   Cholecalciferol (VITAMIN D3) 50 MCG (2000 UT) TABS Take 50 mcg by mouth once.     Omega-3 Fatty Acids (FISH OIL PO) Take 2 capsules by mouth daily.     No current facility-administered medications on file prior to visit.    Past Medical History:  Diagnosis Date   Adenomatous colon polyp    Atrial fibrillation ([place]) 2012   Dr Cristopher Peru seen as OP   Atrial fibrillation ([place]) 11/22-23/2013   presentation as nausea, dizziness & chest tightness   Diverticulosis 2007   Hyperlipidemia    Thyroid disease    hypothyroidism    Past Surgical History:  Procedure Laterality Date   CHEST TUBE INSERTION  1982   Spontaneous Pneumothorax   COLONOSCOPY     COLONOSCOPY W/ POLYPECTOMY  2007   Adenomatous Polyp; [facility], Dr Olevia Perches   Holter monitor-48 hour  03/2011, 12/2010   Dr Crissie Sickles   TONSILLECTOMY     TRANSTHORACIC ECHOCARDIOGRAM   12/2010,     Social History   Socioeconomic History   Marital status:  Married    Spouse name: Not on file   Number of children: 2   Years of education: Not on file   Highest education level: Not on file  Occupational History   Not on file  Tobacco Use   Smoking status: Never   Smokeless tobacco: Never  Vaping Use   Vaping Use: Never used  Substance and Sexual Activity   Alcohol use: No   Drug use: No   Sexual activity: Never  Other Topics Concern   Not on file  Social History Narrative   Exercise: 5 days of aerobic, two days of weights   Social Determinants of Health   Financial Resource Strain: Low Risk    Difficulty of Paying Living Expenses: Not hard at all  Food Insecurity: No Food Insecurity   Worried About Charity fundraiser in the Last Year: Never true   [place] in the Last Year: Never true  Transportation Needs: No Transportation Needs   Lack of Transportation (Medical): No   Lack of Transportation (Non-Medical): No  Physical Activity: Sufficiently Active   Days of Exercise per Week: 7 days   Minutes of Exercise per Session: 40 min  Stress: No Stress Concern Present   Feeling of Stress : Not at all  Social Connections: Socially Integrated   Frequency of Communication with Friends and Family: More than three times a week  Frequency of Social Gatherings with Friends and Family: More than three times a week   Attends Religious Services: More than 4 times per year   Active Member of Clubs or Organizations: Yes   Attends Music therapist: More than 4 times per year   Marital Status: Married    Family History  Problem Relation Age of Onset   Stroke Mother 34       cns aneurysm bleed X2   Breast cancer Mother    Valvular heart disease Mother        valve replacement   Hypertension Mother    Cerebral aneurysm Sister 5   Hypertension Father    Hypertension Sister    Diabetes Neg Hx    Colon cancer Neg Hx    Rectal cancer Neg Hx    Stomach cancer Neg Hx     Review of Systems  Respiratory:  Negative  for shortness of breath and wheezing.   Cardiovascular:  Positive for palpitations. Negative for chest pain and leg swelling.  Genitourinary:  Negative for difficulty urinating, dysuria and hematuria.  Musculoskeletal:  Negative for arthralgias and back pain.  Skin:  Negative for color change and rash.  Neurological:  Negative for light-headedness, numbness and headaches.  Psychiatric/Behavioral:  Negative for dysphoric mood and sleep disturbance. The patient is not nervous/anxious.       Objective:   Vitals:   04/04/21 0750  BP: 136/84  Pulse: 68  Temp: 97.7 F (36.5 C)  SpO2: 96%   Filed Weights   04/04/21 0750  Weight: 252 lb (114.3 kg)   Body mass index is 31.5 kg/m.  BP Readings from Last 3 Encounters:  04/04/21 136/84  04/02/21 124/70  12/22/20 120/76    Wt Readings from Last 3 Encounters:  04/04/21 252 lb (114.3 kg)  04/02/21 252 lb 9.6 oz (114.6 kg)  12/22/20 243 lb (110.2 kg)     Physical Exam Constitutional: He appears well-developed and well-nourished. No distress.  HENT:  Head: Normocephalic and atraumatic.  Right Ear: External ear normal.  Left Ear: External ear normal.  Mouth/Throat: Oropharynx is clear and moist.  Normal ear canals and TM b/l  Eyes: Conjunctivae and EOM are normal.  Neck: Neck supple. No tracheal deviation present. No thyromegaly present.  No carotid bruit.  B/l LE varicose veins Cardiovascular: Normal rate, regular rhythm, normal heart sounds and intact distal pulses.   No murmur heard. Pulmonary/Chest: Effort normal and breath sounds normal. No respiratory distress. He has no wheezes. He has no rales.  Abdominal: Soft. He exhibits no distension. There is no tenderness.  Genitourinary: deferred  Musculoskeletal: He exhibits no edema.  Lymphadenopathy:   He has no cervical adenopathy.  Skin: Skin is warm and dry. He is not diaphoretic.  Psychiatric: He has a normal mood and affect. His behavior is normal.          Assessment & Plan:   Physical exam: Screening blood work  ordered Exercise   regular Weight encouraged weight loss Substance abuse   none   Reviewed recommended immunizations.   Health Maintenance  Topic Date Due   Zoster Vaccines- Shingrix (1 of 2) Never done   COLONOSCOPY (Pts 45-64yrs Insurance coverage will need to be confirmed)  04/05/2018   COVID-19 Vaccine (4 - Booster for Pfizer series) 07/15/2020   INFLUENZA VACCINE  01/15/2021   TETANUS/TDAP  12/24/2025   Hepatitis C Screening  Completed   HPV VACCINES  Aged Out     See  Problem List for Assessment and Plan of chronic medical problems.

## 2021-04-03 NOTE — Patient Instructions (Addendum)
Call GI to set up colonoscopy   - Phone: [phone]   Consider getting the shingles vaccine at your pharmacy.    Blood work was ordered.     Medications changes include :   None  Your prescription(s) have been submitted to your pharmacy. Please take as directed and contact our office if you believe you are having problem(s) with the medication(s).   Please followup in 1 year   Health Maintenance, Male Adopting a healthy lifestyle and getting preventive care are important in promoting health and wellness. Ask your health care provider about: The right schedule for you to have regular tests and exams. Things you can do on your own to prevent diseases and keep yourself healthy. What should I know about diet, weight, and exercise? Eat a healthy diet  Eat a diet that includes plenty of vegetables, fruits, low-fat dairy products, and lean protein. Do not eat a lot of foods that are high in solid fats, added sugars, or sodium. Maintain a healthy weight Body mass index (BMI) is a measurement that can be used to identify possible weight problems. It estimates body fat based on height and weight. Your health care provider can help determine your BMI and help you achieve or maintain a healthy weight. Get regular exercise Get regular exercise. This is one of the most important things you can do for your health. Most adults should: Exercise for at least 150 minutes each week. The exercise should increase your heart rate and make you sweat (moderate-intensity exercise). Do strengthening exercises at least twice a week. This is in addition to the moderate-intensity exercise. Spend less time sitting. Even light physical activity can be beneficial. Watch cholesterol and blood lipids Have your blood tested for lipids and cholesterol at 70 years of age, then have this test every 5 years. You may need to have your cholesterol levels checked more often if: Your lipid or cholesterol levels are  high. You are older than 70 years of age. You are at high risk for heart disease. What should I know about cancer screening? Many types of cancers can be detected early and may often be prevented. Depending on your health history and family history, you may need to have cancer screening at various ages. This may include screening for: Colorectal cancer. Prostate cancer. Skin cancer. Lung cancer. What should I know about heart disease, diabetes, and high blood pressure? Blood pressure and heart disease High blood pressure causes heart disease and increases the risk of stroke. This is more likely to develop in people who have high blood pressure readings, are of African descent, or are overweight. Talk with your health care provider about your target blood pressure readings. Have your blood pressure checked: Every 3-5 years if you are 20-72 years of age. Every year if you are 36 years old or older. If you are between the ages of 86 and 49 and are a current or former smoker, ask your health care provider if you should have a one-time screening for abdominal aortic aneurysm (AAA). Diabetes Have regular diabetes screenings. This checks your fasting blood sugar level. Have the screening done: Once every three years after age 15 if you are at a normal weight and have a low risk for diabetes. More often and at a younger age if you are overweight or have a high risk for diabetes. What should I know about preventing infection? Hepatitis B If you have a higher risk for hepatitis B, you should be screened  for this virus. Talk with your health care provider to find out if you are at risk for hepatitis B infection. Hepatitis C Blood testing is recommended for: Everyone born from [birth year] through 1965. Anyone with known risk factors for hepatitis C. Sexually transmitted infections (STIs) You should be screened each year for STIs, including gonorrhea and chlamydia, if: You are sexually active and are  younger than 70 years of age. You are older than 70 years of age and your health care provider tells you that you are at risk for this type of infection. Your sexual activity has changed since you were last screened, and you are at increased risk for chlamydia or gonorrhea. Ask your health care provider if you are at risk. Ask your health care provider about whether you are at high risk for HIV. Your health care provider may recommend a prescription medicine to help prevent HIV infection. If you choose to take medicine to prevent HIV, you should first get tested for HIV. You should then be tested every 3 months for as long as you are taking the medicine. Follow these instructions at home: Lifestyle Do not use any products that contain nicotine or tobacco, such as cigarettes, e-cigarettes, and chewing tobacco. If you need help quitting, ask your health care provider. Do not use street drugs. Do not share needles. Ask your health care provider for help if you need support or information about quitting drugs. Alcohol use Do not drink alcohol if your health care provider tells you not to drink. If you drink alcohol: Limit how much you have to 0-2 drinks a day. Be aware of how much alcohol is in your drink. In the [place] one drink equals one 12 oz bottle of beer (355 mL), one 5 oz glass of wine (148 mL), or one 1 oz glass of hard liquor (44 mL). General instructions Schedule regular health, dental, and eye exams. Stay current with your vaccines. Tell your health care provider if: You often feel depressed. You have ever been abused or do not feel safe at home. Summary Adopting a healthy lifestyle and getting preventive care are important in promoting health and wellness. Follow your health care provider's instructions about healthy diet, exercising, and getting tested or screened for diseases. Follow your health care provider's instructions on monitoring your cholesterol and blood pressure. This  information is not intended to replace advice given to you by your health care provider. Make sure you discuss any questions you have with your health care provider. Document Revised: 08/11/2020 Document Reviewed: 05/27/2018 Elsevier Patient Education  2022 [employer].

## 2021-04-04 ENCOUNTER — Other Ambulatory Visit: Payer: Self-pay

## 2021-04-04 ENCOUNTER — Ambulatory Visit (INDEPENDENT_AMBULATORY_CARE_PROVIDER_SITE_OTHER): Payer: Medicare PPO | Admitting: Internal Medicine

## 2021-04-04 ENCOUNTER — Encounter: Payer: Self-pay | Admitting: Internal Medicine

## 2021-04-04 VITALS — BP 136/84 | HR 68 | Temp 97.7°F | Ht 75.0 in | Wt 252.0 lb

## 2021-04-04 DIAGNOSIS — Z125 Encounter for screening for malignant neoplasm of prostate: Secondary | ICD-10-CM | POA: Diagnosis not present

## 2021-04-04 DIAGNOSIS — Z0000 Encounter for general adult medical examination without abnormal findings: Secondary | ICD-10-CM

## 2021-04-04 DIAGNOSIS — E782 Mixed hyperlipidemia: Secondary | ICD-10-CM | POA: Diagnosis not present

## 2021-04-04 DIAGNOSIS — I482 Chronic atrial fibrillation, unspecified: Secondary | ICD-10-CM | POA: Diagnosis not present

## 2021-04-04 DIAGNOSIS — E039 Hypothyroidism, unspecified: Secondary | ICD-10-CM | POA: Diagnosis not present

## 2021-04-04 LAB — CBC WITH DIFFERENTIAL/PLATELET
Basophils Absolute: 0.1 10*3/uL (ref 0.0–0.1)
Basophils Relative: 0.9 % (ref 0.0–3.0)
Eosinophils Absolute: 0.2 10*3/uL (ref 0.0–0.7)
Eosinophils Relative: 3.7 % (ref 0.0–5.0)
HCT: 46.8 % (ref 39.0–52.0)
Hemoglobin: 15.7 g/dL (ref 13.0–17.0)
Lymphocytes Relative: 38.6 % (ref 12.0–46.0)
Lymphs Abs: 2.4 10*3/uL (ref 0.7–4.0)
MCHC: 33.5 g/dL (ref 30.0–36.0)
MCV: 89.5 fl (ref 78.0–100.0)
Monocytes Absolute: 0.9 10*3/uL (ref 0.1–1.0)
Monocytes Relative: 13.7 % — ABNORMAL HIGH (ref 3.0–12.0)
Neutro Abs: 2.7 10*3/uL (ref 1.4–7.7)
Neutrophils Relative %: 43.1 % (ref 43.0–77.0)
Platelets: 165 10*3/uL (ref 150.0–400.0)
RBC: 5.22 Mil/uL (ref 4.22–5.81)
RDW: 13.7 % (ref 11.5–15.5)
WBC: 6.2 10*3/uL (ref 4.0–10.5)

## 2021-04-04 LAB — COMPREHENSIVE METABOLIC PANEL
ALT: 11 U/L (ref 0–53)
AST: 28 U/L (ref 0–37)
Albumin: 4.2 g/dL (ref 3.5–5.2)
Alkaline Phosphatase: 66 U/L (ref 39–117)
BUN: 16 mg/dL (ref 6–23)
CO2: 25 mEq/L (ref 19–32)
Calcium: 9.3 mg/dL (ref 8.4–10.5)
Chloride: 105 mEq/L (ref 96–112)
Creatinine, Ser: 1.15 mg/dL (ref 0.40–1.50)
GFR: 64.79 mL/min (ref >60.00)
Glucose, Bld: 91 mg/dL (ref 70–99)
Potassium: 4 mEq/L (ref 3.5–5.1)
Sodium: 138 mEq/L (ref 135–145)
Total Bilirubin: 0.8 mg/dL (ref 0.2–1.2)
Total Protein: 7.4 g/dL (ref 6.0–8.3)

## 2021-04-04 LAB — LIPID PANEL
Cholesterol: 160 mg/dL (ref 0–200)
HDL: 45.4 mg/dL (ref >39.00)
LDL Cholesterol: 101 mg/dL — ABNORMAL HIGH (ref 0–99)
NonHDL: 114.91
Total CHOL/HDL Ratio: 4
Triglycerides: 70 mg/dL (ref 0.0–149.0)
VLDL: 14 mg/dL (ref 0.0–40.0)

## 2021-04-04 LAB — TSH: TSH: 1.86 u[IU]/mL (ref 0.35–5.50)

## 2021-04-04 MED ORDER — SYNTHROID 137 MCG PO TABS: 137.0000 ug | ORAL_TABLET | Freq: Every day | ORAL | 3 refills | Status: DC

## 2021-04-04 NOTE — Assessment & Plan Note (Signed)
Chronic Regular exercise and healthy diet encouraged Check lipid panel  Controlled lifestyle

## 2021-04-04 NOTE — Assessment & Plan Note (Signed)
Chronic  Clinically euthyroid Currently taking Synthroid 137 mcg daily Check tsh  Titrate med dose if needed

## 2021-04-04 NOTE — Assessment & Plan Note (Addendum)
Chronic Following with Dr Lovena Le Not on a/c TSH

## 2021-04-09 ENCOUNTER — Encounter: Payer: Self-pay | Admitting: Internal Medicine

## 2021-04-18 DIAGNOSIS — L853 Xerosis cutis: Secondary | ICD-10-CM | POA: Diagnosis not present

## 2021-04-18 DIAGNOSIS — L299 Pruritus, unspecified: Secondary | ICD-10-CM | POA: Diagnosis not present

## 2021-04-23 ENCOUNTER — Encounter: Payer: Self-pay | Admitting: Gastroenterology

## 2021-06-15 ENCOUNTER — Ambulatory Visit (AMBULATORY_SURGERY_CENTER): Payer: Medicare PPO | Admitting: *Deleted

## 2021-06-15 ENCOUNTER — Other Ambulatory Visit: Payer: Self-pay

## 2021-06-15 VITALS — Ht 75.0 in | Wt 245.0 lb

## 2021-06-15 DIAGNOSIS — Z86010 Personal history of colonic polyps: Secondary | ICD-10-CM

## 2021-06-15 MED ORDER — NA SULFATE-K SULFATE-MG SULF 17.5-3.13-1.6 GM/177ML PO SOLN: 1.0000 | Freq: Once | ORAL | 0 refills | Status: AC

## 2021-06-15 NOTE — Progress Notes (Signed)
PV completed over the phone. Pt verified name, DOB, address and insurance during PV today.  Pt mailed instruction packet with copy of consent form to read and not return, and instructions.   Pt encouraged to call with questions or issues.  If pt has My chart, procedure instructions sent via My Chart   No egg or soy allergy known to patient  No issues known to pt with past sedation with any surgeries or procedures Patient denies ever being told they had issues or difficulty with intubation  No FH of Malignant Hyperthermia Pt is not on diet pills Pt is not on  home 02  Pt is not on blood thinners  Pt denies issues with constipation  Past hx of A fib or A flutter  Pt is fully vaccinated  for Covid    NO PA's for preps discussed with pt In PV today  Discussed with pt there will be an out-of-pocket cost for prep and that varies from $0 to 70 +  dollars - pt verbalized understanding   Due to the COVID-19 pandemic we are asking patients to follow certain guidelines in PV and the [place]   Pt aware of COVID protocols and [facility] guidelines

## 2021-06-25 ENCOUNTER — Encounter: Payer: Self-pay | Admitting: Gastroenterology

## 2021-06-26 ENCOUNTER — Telehealth: Payer: Self-pay | Admitting: Gastroenterology

## 2021-06-26 NOTE — Telephone Encounter (Signed)
Hey Dr. Havery Moros,   Inbound call from patient calling to cancel procedure 1/13 due to wife being COVID +. He stated he is getting symptoms and will get tested tomorrow 1/11. Patient rescheduled for 07/06/21  Thank you

## 2021-06-26 NOTE — Telephone Encounter (Signed)
Sorry to hear that, that is unfortunate. Thanks for letting me know

## 2021-06-27 DIAGNOSIS — Z20822 Contact with and (suspected) exposure to covid-19: Secondary | ICD-10-CM | POA: Diagnosis not present

## 2021-06-29 ENCOUNTER — Encounter: Payer: Medicare PPO | Admitting: Gastroenterology

## 2021-07-03 ENCOUNTER — Telehealth: Payer: Self-pay | Admitting: Gastroenterology

## 2021-07-03 NOTE — Telephone Encounter (Signed)
Good Afternoon Dr. Havery Moros,  Patient called to cancel procedure with you on 1/20 at 96:00 due to his wife having covid and he is starting to have symptoms.  Patient stated that he will call back at a later time to reschedule.

## 2021-07-03 NOTE — Telephone Encounter (Signed)
Okay thanks for letting me know, he can reschedule at his convenience.  

## 2021-07-06 ENCOUNTER — Encounter: Payer: Medicare PPO | Admitting: Gastroenterology

## 2021-07-30 ENCOUNTER — Encounter: Payer: Self-pay | Admitting: Internal Medicine

## 2021-07-30 NOTE — Progress Notes (Signed)
Subjective:    Patient ID: Elijah [name], male    DOB: September 29, 1950, 71 y.o.   MRN: [id number]  This visit occurred during the SARS-CoV-2 public health emergency.  Safety protocols were in place, including screening questions prior to the visit, additional usage of staff PPE, and extensive cleaning of exam room while observing appropriate contact time as indicated for disinfecting solutions.    HPI He is here for an acute visit for cold symptoms.  His symptoms started a few days ago.  He is experiencing nasal congestion, ear pain associated with some hearing loss, sinus pain, sore throat, cough, shortness of breath, wheezing, headaches and dizziness.  He has not had any fevers.  His COVID test was negative.  He has hoarseness.  He has been taking Mucinex and tried DayQuil without much improvement.   His COVID test was negative.  Left leg swelling and purpleish coloration-yesterday he woke up and he noticed that his left lower leg was swollen and his foot was purplish in color-even his leg was purplish.  He denied any pain.  Throughout the day this got better and today is back to normal, but he is not sure why that happened.  He denies any injuries to the leg and no cuts.  He does have significant varicosities in the leg and wears compression socks daily.  He does have a history of superficial thrombophlebitis   Medications and allergies reviewed with patient and updated if appropriate.  Patient Active Problem List   Diagnosis Date Noted   Superficial thrombophlebitis 05/26/2019   Onychomycosis of great toe 03/21/2018   Numbness 03/21/2018   Adenomatous polyp of colon 11/22/2013   Atrial fibrillation (HCC) - Dr Lovena Le 12/31/2010   Hyperlipidemia 04/26/2008   Hypothyroidism 02/19/2007    Current Outpatient Medications on File Prior to Visit  Medication Sig Dispense Refill   Cholecalciferol (VITAMIN D3) 50 MCG (2000 UT) TABS Take 50 mcg by mouth once.     Na Sulfate-K  Sulfate-Mg Sulf 17.5-3.13-1.6 GM/177ML SOLN Take by mouth.     Omega-3 Fatty Acids (FISH OIL PO) Take 2 capsules by mouth daily.     SYNTHROID 137 MCG tablet Take 1 tablet (137 mcg total) by mouth daily before breakfast. 90 tablet 3   triamcinolone cream (KENALOG) 0.1 % Apply topically. (Patient not taking: Reported on 08/01/2021)     No current facility-administered medications on file prior to visit.    Past Medical History:  Diagnosis Date   Adenomatous colon polyp    Arthritis    mild thumbs   Atrial fibrillation ([place]) 06/17/2010   Dr Cristopher Peru seen as OP   Atrial fibrillation ([place]) 11/22-23/2013   presentation as nausea, dizziness & chest tightness   Diverticulosis 06/17/2005   Hyperlipidemia    past hx - no issues per pt   Thyroid disease    hypothyroidism    Past Surgical History:  Procedure Laterality Date   CHEST TUBE INSERTION  1982   Spontaneous Pneumothorax   COLONOSCOPY     COLONOSCOPY W/ POLYPECTOMY  2007   Adenomatous Polyp; [facility], Dr Olevia Perches   Holter monitor-48 hour  03/2011, 12/2010   Dr Crissie Sickles   TONSILLECTOMY     TRANSTHORACIC ECHOCARDIOGRAM   12/2010,     Social History   Socioeconomic History   Marital status: Married    Spouse name: Not on file   Number of children: 2   Years of education: Not on file   Highest education  level: Not on file  Occupational History   Not on file  Tobacco Use   Smoking status: Never   Smokeless tobacco: Never  Vaping Use   Vaping Use: Never used  Substance and Sexual Activity   Alcohol use: No   Drug use: No   Sexual activity: Never  Other Topics Concern   Not on file  Social History Narrative   Exercise: 5 days of aerobic, two days of weights   Social Determinants of Health   Financial Resource Strain: Low Risk    Difficulty of Paying Living Expenses: Not hard at all  Food Insecurity: No Food Insecurity   Worried About Charity fundraiser in the Last Year: Never true   Ran Out of Food in  the Last Year: Never true  Transportation Needs: No Transportation Needs   Lack of Transportation (Medical): No   Lack of Transportation (Non-Medical): No  Physical Activity: Sufficiently Active   Days of Exercise per Week: 7 days   Minutes of Exercise per Session: 40 min  Stress: No Stress Concern Present   Feeling of Stress : Not at all  Social Connections: Socially Integrated   Frequency of Communication with Friends and Family: More than three times a week   Frequency of Social Gatherings with Friends and Family: More than three times a week   Attends Religious Services: More than 4 times per year   Active Member of [employer] or Organizations: Yes   Attends Music therapist: More than 4 times per year   Marital Status: Married    Family History  Problem Relation Age of Onset   Stroke Mother 84       cns aneurysm bleed X2   Breast cancer Mother    Valvular heart disease Mother        valve replacement   Hypertension Mother    Hypertension Father    Cerebral aneurysm Sister 54   Hypertension Sister    Diabetes Neg Hx    Colon cancer Neg Hx    Rectal cancer Neg Hx    Stomach cancer Neg Hx    Colon polyps Neg Hx    Esophageal cancer Neg Hx     Review of Systems  Constitutional:  Negative for fever.  HENT:  Positive for congestion, ear pain, hearing loss, sinus pressure, sore throat and voice change.   Respiratory:  Positive for cough, shortness of breath and wheezing.   Neurological:  Positive for dizziness and headaches.      Objective:   Vitals:   08/01/21 0757  BP: 134/76  Pulse: 73  Temp: 98.2 F (36.8 C)  SpO2: 96%   BP Readings from Last 3 Encounters:  08/01/21 134/76  04/04/21 136/84  04/02/21 124/70   Wt Readings from Last 3 Encounters:  08/01/21 250 lb (113.4 kg)  06/15/21 245 lb (111.1 kg)  04/04/21 252 lb (114.3 kg)   Body mass index is 31.25 kg/m.   Physical Exam    GENERAL APPEARANCE: Appears stated age, well appearing,  NAD EYES: conjunctiva clear, no icterus HENT: bilateral tympanic membranes and ear canals normal, oropharynx with no erythema or exudates, trachea midline, no cervical or supraclavicular lymphadenopathy LUNGS: Unlabored breathing, good air entry bilaterally, clear to auscultation without wheeze or crackles CARDIOVASCULAR: Normal S1,S2 , no edema.  Left lower extremity with significant varicosities in foot and lower leg.  No evidence of superficial phlebitis MSK: Left calf nontender SKIN: Warm, dry      Assessment &  Plan:    See Problem List for Assessment and Plan of chronic medical problems.

## 2021-08-01 ENCOUNTER — Other Ambulatory Visit: Payer: Self-pay

## 2021-08-01 ENCOUNTER — Ambulatory Visit: Payer: Medicare PPO | Admitting: Internal Medicine

## 2021-08-01 VITALS — BP 134/76 | HR 73 | Temp 98.2°F | Ht 75.0 in | Wt 250.0 lb

## 2021-08-01 DIAGNOSIS — M7989 Other specified soft tissue disorders: Secondary | ICD-10-CM | POA: Insufficient documentation

## 2021-08-01 DIAGNOSIS — J0100 Acute maxillary sinusitis, unspecified: Secondary | ICD-10-CM | POA: Diagnosis not present

## 2021-08-01 MED ORDER — AMOXICILLIN-POT CLAVULANATE 875-125 MG PO TABS: 1.0000 | ORAL_TABLET | Freq: Two times a day (BID) | ORAL | 0 refills | Status: DC

## 2021-08-01 MED ORDER — HYDROCODONE BIT-HOMATROP MBR 5-1.5 MG/5ML PO SOLN: 5.0000 mL | Freq: Three times a day (TID) | ORAL | 0 refills | Status: DC | PRN

## 2021-08-01 NOTE — Assessment & Plan Note (Signed)
Acute Left leg swelling and discoloration yesterday morning without injury He has significant varicosities in his veins and concerns for blood clot Symptoms have resolved, but given his significant risk we will get an ultrasound of the left lower extremity

## 2021-08-01 NOTE — Patient Instructions (Addendum)
° ° ° °  An ultrasound  of your leg was ordered.    Medications changes include :   augmentin, cough syrup   Your prescription(s) have been sent to your pharmacy.      Return if symptoms worsen or fail to improve.

## 2021-08-01 NOTE — Assessment & Plan Note (Signed)
Acute With some reactive airway disease Concern for bacterial cause Start Augmentin 875-125 mg twice daily Hycodan cough syrup 5 mils every 8 hours as needed Can continue over-the-counter cold medications for symptom relief Rest, fluids Call if no improvement

## 2021-08-02 ENCOUNTER — Ambulatory Visit (HOSPITAL_COMMUNITY)
Admission: RE | Admit: 2021-08-02 | Discharge: 2021-08-02 | Disposition: A | Discharge status: Home / Self Care | Payer: Medicare PPO | Source: Ambulatory Visit | Attending: Cardiology | Admitting: Cardiology

## 2021-08-02 DIAGNOSIS — M7989 Other specified soft tissue disorders: Secondary | ICD-10-CM | POA: Diagnosis not present

## 2021-08-09 ENCOUNTER — Encounter: Payer: Self-pay | Admitting: Internal Medicine

## 2021-08-09 MED ORDER — PREDNISONE 20 MG PO TABS: 40.0000 mg | ORAL_TABLET | Freq: Every day | ORAL | 0 refills | Status: AC

## 2021-08-09 MED ORDER — DOXYCYCLINE HYCLATE 100 MG PO TABS: 100.0000 mg | ORAL_TABLET | Freq: Two times a day (BID) | ORAL | 0 refills | Status: AC

## 2021-08-27 ENCOUNTER — Encounter: Payer: Self-pay | Admitting: Gastroenterology

## 2021-08-27 ENCOUNTER — Other Ambulatory Visit: Payer: Self-pay

## 2021-08-27 ENCOUNTER — Ambulatory Visit (AMBULATORY_SURGERY_CENTER): Payer: Medicare PPO | Admitting: Gastroenterology

## 2021-08-27 VITALS — BP 121/89 | HR 73 | Temp 97.1°F | Resp 13 | Ht 75.0 in | Wt 243.0 lb

## 2021-08-27 DIAGNOSIS — D128 Benign neoplasm of rectum: Secondary | ICD-10-CM | POA: Diagnosis not present

## 2021-08-27 DIAGNOSIS — K621 Rectal polyp: Secondary | ICD-10-CM | POA: Diagnosis not present

## 2021-08-27 DIAGNOSIS — Z86010 Personal history of colonic polyps: Secondary | ICD-10-CM | POA: Diagnosis not present

## 2021-08-27 MED ORDER — SODIUM CHLORIDE 0.9 % IV SOLN: 500.0000 mL | INTRAVENOUS | Status: DC

## 2021-08-27 NOTE — Progress Notes (Signed)
VS per CNW ?

## 2021-08-27 NOTE — Progress Notes (Signed)
PT taken to PACU. Monitors in place. VSS. Report given to RN. 

## 2021-08-27 NOTE — Progress Notes (Signed)
Called to room to assist during endoscopic procedure.  Patient ID and intended procedure confirmed with present staff. Received instructions for my participation in the procedure from the performing physician.  

## 2021-08-27 NOTE — Progress Notes (Signed)
[facility] History and Physical ? ? ?Primary Care Physician:  Binnie Rail, MD ? ? ?Reason for Procedure:   History of colon polyps ? ?Plan:    colonoscopy ? ? ? ? ?HPI: Elijah [name] is a 71 y.o. male  here for colonoscopy surveillance - multiple adenomas removed 03/2015. Patient denies any bowel symptoms at this time. No family history of colon cancer known. Otherwise feels well without any cardiopulmonary symptoms.  ? ? ?Past Medical History:  ?Diagnosis Date  ? Adenomatous colon polyp   ? Arthritis   ? mild thumbs  ? Atrial fibrillation ([place]) 06/17/2010  ? Dr Cristopher Peru seen as OP  ? Atrial fibrillation ([place]) 11/22-23/2013  ? presentation as nausea, dizziness & chest tightness  ? Diverticulosis 06/17/2005  ? Hyperlipidemia   ? past hx - no issues per pt  ? Thyroid disease   ? hypothyroidism  ? ? ?Past Surgical History:  ?Procedure Laterality Date  ? CHEST TUBE INSERTION  1982  ? Spontaneous Pneumothorax  ? COLONOSCOPY    ? COLONOSCOPY W/ POLYPECTOMY  2007  ? Adenomatous Polyp; [facility], Dr Olevia Perches  ? Holter monitor-48 hour  03/2011, 12/2010  ? Dr Crissie Sickles  ? TONSILLECTOMY    ? TRANSTHORACIC ECHOCARDIOGRAM   12/2010,   ? ? ?Prior to Admission medications   ?Medication Sig Start Date End Date Taking? Authorizing Provider  ?Cholecalciferol (VITAMIN D3) 50 MCG (2000 UT) TABS Take 50 mcg by mouth once.   Yes [provider]  ?Omega-3 Fatty Acids (FISH OIL PO) Take 2 capsules by mouth daily.   Yes [provider]  ?SYNTHROID 137 MCG tablet Take 1 tablet (137 mcg total) by mouth daily before breakfast. 04/04/21  Yes Burns, Claudina Lick, MD  ?amoxicillin-clavulanate (AUGMENTIN) 875-125 MG tablet Take 1 tablet by mouth 2 (two) times daily. ?Patient not taking: Reported on 08/27/2021 08/01/21   Binnie Rail, MD  ?HYDROcodone bit-homatropine (HYCODAN) 5-1.5 MG/5ML syrup Take 5 mLs by mouth every 8 (eight) hours as needed for cough. ?Patient not taking: Reported on 08/27/2021 08/01/21    Binnie Rail, MD  ?triamcinolone cream (KENALOG) 0.1 % Apply topically. ?Patient not taking: Reported on 08/01/2021 04/18/21   [provider]  ? ? ?Current Outpatient Medications  ?Medication Sig Dispense Refill  ? Cholecalciferol (VITAMIN D3) 50 MCG (2000 UT) TABS Take 50 mcg by mouth once.    ? Omega-3 Fatty Acids (FISH OIL PO) Take 2 capsules by mouth daily.    ? SYNTHROID 137 MCG tablet Take 1 tablet (137 mcg total) by mouth daily before breakfast. 90 tablet 3  ? amoxicillin-clavulanate (AUGMENTIN) 875-125 MG tablet Take 1 tablet by mouth 2 (two) times daily. (Patient not taking: Reported on 08/27/2021) 20 tablet 0  ? HYDROcodone bit-homatropine (HYCODAN) 5-1.5 MG/5ML syrup Take 5 mLs by mouth every 8 (eight) hours as needed for cough. (Patient not taking: Reported on 08/27/2021) 120 mL 0  ? triamcinolone cream (KENALOG) 0.1 % Apply topically. (Patient not taking: Reported on 08/01/2021)    ? ?Current Facility-Administered Medications  ?Medication Dose Route Frequency Provider Last Rate Last Admin  ? 0.9 %  sodium chloride infusion  500 mL Intravenous Continuous Jasim Harari, Carlota Raspberry, MD      ? ? ?Allergies as of 08/27/2021  ? (No Known Allergies)  ? ? ?Family History  ?Problem Relation Age of Onset  ? Stroke Mother 41  ?     cns aneurysm bleed X2  ? Breast cancer Mother   ?  Valvular heart disease Mother   ?     valve replacement  ? Hypertension Mother   ? Hypertension Father   ? Cerebral aneurysm Sister 42  ? Hypertension Sister   ? Diabetes Neg Hx   ? Colon cancer Neg Hx   ? Rectal cancer Neg Hx   ? Stomach cancer Neg Hx   ? Colon polyps Neg Hx   ? Esophageal cancer Neg Hx   ? ? ?Social History  ? ?Socioeconomic History  ? Marital status: Married  ?  Spouse name: Not on file  ? Number of children: 2  ? Years of education: Not on file  ? Highest education level: Not on file  ?Occupational History  ? Not on file  ?Tobacco Use  ? Smoking status: Never  ? Smokeless tobacco: Never  ?Vaping Use  ? Vaping Use:  Never used  ?Substance and Sexual Activity  ? Alcohol use: No  ? Drug use: No  ? Sexual activity: Never  ?Other Topics Concern  ? Not on file  ?Social History Narrative  ? Exercise: 5 days of aerobic, two days of weights  ? ?Social Determinants of Health  ? ?Financial Resource Strain: Low Risk   ? Difficulty of Paying Living Expenses: Not hard at all  ?Food Insecurity: No Food Insecurity  ? Worried About Charity fundraiser in the Last Year: Never true  ? Ran Out of Food in the Last Year: Never true  ?Transportation Needs: No Transportation Needs  ? Lack of Transportation (Medical): No  ? Lack of Transportation (Non-Medical): No  ?Physical Activity: Sufficiently Active  ? Days of Exercise per Week: 7 days  ? Minutes of Exercise per Session: 40 min  ?Stress: No Stress Concern Present  ? Feeling of Stress : Not at all  ?Social Connections: Socially Integrated  ? Frequency of Communication with Friends and Family: More than three times a week  ? Frequency of Social Gatherings with Friends and Family: More than three times a week  ? Attends Religious Services: More than 4 times per year  ? Active Member of Clubs or Organizations: Yes  ? Attends Archivist Meetings: More than 4 times per year  ? Marital Status: Married  ?Intimate Partner Violence: Not At Risk  ? Fear of Current or Ex-Partner: No  ? Emotionally Abused: No  ? Physically Abused: No  ? Sexually Abused: No  ? ? ?Review of Systems: ?All other review of systems negative except as mentioned in the HPI. ? ?Physical Exam: ?Vital signs ?BP (!) 154/92   Pulse 72   Temp (!) 97.1 ?F (36.2 ?C)   Resp (!) 8   Ht '6\' 3"'$  (1.905 m)   Wt 243 lb (110.2 kg)   SpO2 100%   BMI 30.37 kg/m?  ? ?General:   Alert,  Well-developed, pleasant and cooperative in NAD ?Lungs:  Clear throughout to auscultation.   ?Heart:  Regular rate and rhythm ?Abdomen:  Soft, nontender and nondistended.   ?Neuro/Psych:  Alert and cooperative. Normal mood and affect. A and O x 3 ? ?Jolly Mango, MD ?[facility] ? ? ?

## 2021-08-27 NOTE — Patient Instructions (Signed)
Read all of the handouts given to you by your recover room nurse. ? ?YOU HAD AN ENDOSCOPIC PROCEDURE TODAY AT [facility]:   Refer to the procedure report that was given to you for any specific questions about what was found during the examination.  If the procedure report does not answer your questions, please call your gastroenterologist to clarify.  If you requested that your care partner not be given the details of your procedure findings, then the procedure report has been included in a sealed envelope for you to review at your convenience later. ? ?YOU SHOULD EXPECT: Some feelings of bloating in the abdomen. Passage of more gas than usual.  Walking can help get rid of the air that was put into your GI tract during the procedure and reduce the bloating. If you had a lower endoscopy (such as a colonoscopy or flexible sigmoidoscopy) you may notice spotting of blood in your stool or on the toilet paper. If you underwent a bowel prep for your procedure, you may not have a normal bowel movement for a few days. ? ?Please Note:  You might notice some irritation and congestion in your nose or some drainage.  This is from the oxygen used during your procedure.  There is no need for concern and it should clear up in a day or so. ? ?SYMPTOMS TO REPORT IMMEDIATELY: ? ?Following lower endoscopy (colonoscopy or flexible sigmoidoscopy): ? Excessive amounts of blood in the stool ? Significant tenderness or worsening of abdominal pains ? Swelling of the abdomen that is new, acute ? Fever of 100?F or higher ? ? ?For urgent or emergent issues, a gastroenterologist can be reached at any hour by calling [phone]. ?Do not use MyChart messaging for urgent concerns.  ? ? ?DIET:  We do recommend a small meal at first, but then you may proceed to your regular diet.  Drink plenty of fluids but you should avoid alcoholic beverages for 24 hours. ? ?ACTIVITY:  You should plan to take it easy for the rest of today and  you should NOT DRIVE or use heavy machinery until tomorrow (because of the sedation medicines used during the test).   ? ?FOLLOW UP: ?Our staff will call the number listed on your records 48-72 hours following your procedure to check on you and address any questions or concerns that you may have regarding the information given to you following your procedure. If we do not reach you, we will leave a message.  We will attempt to reach you two times.  During this call, we will ask if you have developed any symptoms of COVID 19. If you develop any symptoms (ie: fever, flu-like symptoms, shortness of breath, cough etc.) before then, please call [phone].  If you test positive for Covid 19 in the 2 weeks post procedure, please call and report this information to [place].   ? ?If any biopsies were taken you will be contacted by phone or by letter within the next 1-3 weeks.  Please call us at [phone] if you have not heard about the biopsies in 3 weeks.  ? ? ?SIGNATURES/CONFIDENTIALITY: ?You and/or your care partner have signed paperwork which will be entered into your electronic medical record.  These signatures attest to the fact that that the information above on your After Visit Summary has been reviewed and is understood.  Full responsibility of the confidentiality of this discharge information lies with you and/or your care-partner.  ?

## 2021-08-27 NOTE — Op Note (Signed)
[facility] ?Patient Name: Elijah [name] ?Procedure Date: 08/27/2021 9:38 AM ?MRN: [id number] ?Endoscopist: Carlota Raspberry. Havery Moros , MD ?Age: 71 ?Referring MD:  ?Date of Birth: 1950/10/05 ?Gender: Male ?Account #: [id number] ?Procedure:                Colonoscopy ?Indications:              High risk colon cancer surveillance: Personal  ?                          history of colonic polyps (5 polyps - most adenomas  ?                          - 03/2015) ?Medicines:                Monitored Anesthesia Care ?Procedure:                Pre-Anesthesia Assessment: ?                          - Prior to the procedure, a History and Physical  ?                          was performed, and patient medications and  ?                          allergies were reviewed. The patient's tolerance of  ?                          previous anesthesia was also reviewed. The risks  ?                          and benefits of the procedure and the sedation  ?                          options and risks were discussed with the patient.  ?                          All questions were answered, and informed consent  ?                          was obtained. Prior Anticoagulants: The patient has  ?                          taken no previous anticoagulant or antiplatelet  ?                          agents. ASA Grade Assessment: III - A patient with  ?                          severe systemic disease. After reviewing the risks  ?                          and benefits, the patient was deemed in  ?  satisfactory condition to undergo the procedure. ?                          After obtaining informed consent, the colonoscope  ?                          was passed under direct vision. Throughout the  ?                          procedure, the patient's blood pressure, pulse, and  ?                          oxygen saturations were monitored continuously. The  ?                          Olympus CF-HQ190L (Serial# [id number])  Colonoscope was  ?                          introduced through the anus and advanced to the the  ?                          cecum, identified by appendiceal orifice and  ?                          ileocecal valve. The colonoscopy was performed  ?                          without difficulty. The patient tolerated the  ?                          procedure well. The quality of the bowel  ?                          preparation was good. The ileocecal valve,  ?                          appendiceal orifice, and rectum were photographed. ?Scope In: 10:04:27 AM ?Scope Out: 10:25:43 AM ?Scope Withdrawal Time: 0 hours 18 minutes 13 seconds  ?Total Procedure Duration: 0 hours 21 minutes 16 seconds  ?Findings:                 The perianal and digital rectal examinations were  ?                          normal. ?                          Multiple small-mouthed diverticula were found in  ?                          the sigmoid colon. ?                          Two sessile polyps were found in the rectum. The  ?  polyps were 1 to 3 mm in size. These polyps were  ?                          removed with a cold snare. Resection and retrieval  ?                          were complete. ?                          Internal hemorrhoids were found during  ?                          retroflexion. The hemorrhoids were small. ?                          The exam was otherwise without abnormality. ?Complications:            No immediate complications. Estimated blood loss:  ?                          Minimal. ?Estimated Blood Loss:     Estimated blood loss was minimal. ?Impression:               - Diverticulosis in the sigmoid colon. ?                          - Two 1 to 3 mm polyps in the rectum, removed with  ?                          a cold snare. Resected and retrieved. ?                          - Internal hemorrhoids. ?                          - The examination was otherwise normal. ?Recommendation:           -  Patient has a contact number available for  ?                          emergencies. The signs and symptoms of potential  ?                          delayed complications were discussed with the  ?                          patient. Return to normal activities tomorrow.  ?                          Written discharge instructions were provided to the  ?                          patient. ?                          - Resume previous diet. ?                          -  Continue present medications. ?                          - Await pathology results. ?Carlota Raspberry. Havery Moros, MD ?08/27/2021 10:29:40 AM ?This report has been signed electronically. ?

## 2021-08-29 ENCOUNTER — Telehealth: Payer: Self-pay | Admitting: *Deleted

## 2021-08-29 NOTE — Telephone Encounter (Signed)
?  Follow up Call- ? ?Call back number 08/27/2021  ?Post procedure Call Back phone  # [phone]  ?Permission to leave phone message Yes  ?Some recent data might be hidden  ?  ? ?Patient questions: ? ?Do you have a fever, pain , or abdominal swelling? No. ?Pain Score  0 * ? ?Have you tolerated food without any problems? Yes.   ? ?Have you been able to return to your normal activities? Yes.   ? ?Do you have any questions about your discharge instructions: ?Diet   No. ?Medications  No. ?Follow up visit  No. ? ?Do you have questions or concerns about your Care? No. ? ?Actions: ?* If pain score is 4 or above: ?No action needed, pain <4. ? ? ?

## 2021-09-04 ENCOUNTER — Encounter: Payer: Self-pay | Admitting: Gastroenterology

## 2021-09-24 ENCOUNTER — Ambulatory Visit: Payer: Medicare PPO | Admitting: Internal Medicine

## 2021-09-24 ENCOUNTER — Encounter: Payer: Self-pay | Admitting: Internal Medicine

## 2021-09-24 VITALS — BP 136/86 | HR 75 | Ht 75.0 in | Wt 251.0 lb

## 2021-09-24 DIAGNOSIS — I482 Chronic atrial fibrillation, unspecified: Secondary | ICD-10-CM | POA: Diagnosis not present

## 2021-09-24 MED ORDER — RIVAROXABAN 20 MG PO TABS: 20.0000 mg | ORAL_TABLET | Freq: Every day | ORAL | 3 refills | Status: DC

## 2021-09-24 NOTE — Progress Notes (Signed)
? ? ? ? ?HPI ?Elijah [name] returns today for followup. He is a pleasant 71 man with a h/o chronic atrial fib, newly diagnosed HTN, and dyslipidemia . He has not had palpitations. He notes that his bp has been increased.  ?No Known Allergies ? ? ?Current Outpatient Medications  ?Medication Sig Dispense Refill  ? Cholecalciferol (VITAMIN D3) 50 MCG (2000 UT) TABS Take 50 mcg by mouth once.    ? Omega-3 Fatty Acids (FISH OIL PO) Take 2 capsules by mouth daily.    ? SYNTHROID 137 MCG tablet Take 1 tablet (137 mcg total) by mouth daily before breakfast. 90 tablet 3  ? ?No current facility-administered medications for this visit.  ? ? ? ?Past Medical History:  ?Diagnosis Date  ? Adenomatous colon polyp   ? Arthritis   ? mild thumbs  ? Atrial fibrillation ([place]) 06/17/2010  ? Dr Cristopher Peru seen as OP  ? Atrial fibrillation ([place]) 11/22-23/2013  ? presentation as nausea, dizziness & chest tightness  ? Diverticulosis 06/17/2005  ? Hyperlipidemia   ? past hx - no issues per pt  ? Thyroid disease   ? hypothyroidism  ? ? ?ROS: ? ? All systems reviewed and negative except as noted in the HPI. ? ? ?Past Surgical History:  ?Procedure Laterality Date  ? CHEST TUBE INSERTION  1982  ? Spontaneous Pneumothorax  ? COLONOSCOPY    ? COLONOSCOPY W/ POLYPECTOMY  2007  ? Adenomatous Polyp; [facility], Dr Olevia Perches  ? Holter monitor-48 hour  03/2011, 12/2010  ? Dr Crissie Sickles  ? TONSILLECTOMY    ? TRANSTHORACIC ECHOCARDIOGRAM   12/2010,   ? ? ? ?Family History  ?Problem Relation Age of Onset  ? Stroke Mother 21  ?     cns aneurysm bleed X2  ? Breast cancer Mother   ? Valvular heart disease Mother   ?     valve replacement  ? Hypertension Mother   ? Hypertension Father   ? Cerebral aneurysm Sister 44  ? Hypertension Sister   ? Diabetes Neg Hx   ? Colon cancer Neg Hx   ? Rectal cancer Neg Hx   ? Stomach cancer Neg Hx   ? Colon polyps Neg Hx   ? Esophageal cancer Neg Hx   ? ? ? ?Social History  ? ?Socioeconomic History  ? Marital status: Married  ?   Spouse name: Not on file  ? Number of children: 2  ? Years of education: Not on file  ? Highest education level: Not on file  ?Occupational History  ? Not on file  ?Tobacco Use  ? Smoking status: Never  ? Smokeless tobacco: Never  ?Vaping Use  ? Vaping Use: Never used  ?Substance and Sexual Activity  ? Alcohol use: No  ? Drug use: No  ? Sexual activity: Never  ?Other Topics Concern  ? Not on file  ?Social History Narrative  ? Exercise: 5 days of aerobic, two days of weights  ? ?Social Determinants of Health  ? ?Financial Resource Strain: Low Risk   ? Difficulty of Paying Living Expenses: Not hard at all  ?Food Insecurity: No Food Insecurity  ? Worried About Charity fundraiser in the Last Year: Never true  ? Ran Out of Food in the Last Year: Never true  ?Transportation Needs: No Transportation Needs  ? Lack of Transportation (Medical): No  ? Lack of Transportation (Non-Medical): No  ?Physical Activity: Sufficiently Active  ? Days of Exercise per Week: 7  days  ? Minutes of Exercise per Session: 40 min  ?Stress: No Stress Concern Present  ? Feeling of Stress : Not at all  ?Social Connections: Socially Integrated  ? Frequency of Communication with Friends and Family: More than three times a week  ? Frequency of Social Gatherings with Friends and Family: More than three times a week  ? Attends Religious Services: More than 4 times per year  ? Active Member of Clubs or Organizations: Yes  ? Attends Archivist Meetings: More than 4 times per year  ? Marital Status: Married  ?Intimate Partner Violence: Not At Risk  ? Fear of Current or Ex-Partner: No  ? Emotionally Abused: No  ? Physically Abused: No  ? Sexually Abused: No  ? ? ? ?BP 136/86   Pulse 75   Ht '6\' 3"'$  (1.905 m)   Wt 251 lb (113.9 kg)   BMI 31.37 kg/m?  ? ?Physical Exam: ? ?Well appearing 71 yo man, NAD ?HEENT: Unremarkable ?Neck:  No JVD, no thyromegally ?Lymphatics:  No adenopathy ?Back:  No CVA tenderness ?Lungs:  Clear with no wheezes ?HEART:   Regular rate rhythm, no murmurs, no rubs, no clicks ?Abd:  soft, positive bowel sounds, no organomegally, no rebound, no guarding ?Ext:  2 plus pulses, left calf with mild venous insufficiency, no cyanosis, no clubbing ?Skin:  No rashes no nodules ?Neuro:  CN II through XII intact, motor grossly intact ? ?EKG - atrial fib with a controlled VR ? ?Assess/Plan:  ?Atrial fib - his VR is well controlled. No change in rate control. His CHADSVASC has increased. He will start xarelto. ?HTN - his bp has been running in the 130-140's. He will work on weight and exercise. I would anticipate starting amlodipine the next time we see him if not sooner. ?Venous insufficiency - he had a sore leg and some swollen varicose veins and he will continue his support stockings. ? ?Carleene Overlie Mahesh Sizemore,MD ?

## 2021-09-24 NOTE — Patient Instructions (Addendum)
Medication Instructions:  ?Your physician has recommended you make the following change in your medication:  ? ? START taking Xarelto 20 mg- Take one tablet by mouth daily with your largest meal. ? ?Labwork: ?None ordered. ? ?Testing/Procedures: ?None ordered. ? ?Follow-Up: ?Your physician wants you to follow-up in: one year with Cristopher Peru, MD  ? ?You will receive a reminder letter in the mail two months in advance. If you don't receive a letter, please call our office to schedule the follow-up appointment. ? ?Any Other Special Instructions Will Be Listed Below (If Applicable). ? ?If you need a refill on your cardiac medications before your next appointment, please call your pharmacy.  ? ?Important Information About Sugar ? ? ? ? ? ? ? ?

## 2021-10-02 ENCOUNTER — Encounter: Payer: Self-pay | Admitting: Internal Medicine

## 2021-10-02 MED ORDER — LEVOTHYROXINE SODIUM 137 MCG PO TABS: 137.0000 ug | ORAL_TABLET | Freq: Every day | ORAL | 3 refills | Status: DC

## 2021-11-27 ENCOUNTER — Encounter: Payer: Self-pay | Admitting: Internal Medicine

## 2021-11-27 ENCOUNTER — Ambulatory Visit: Payer: Medicare PPO | Admitting: Internal Medicine

## 2021-11-27 DIAGNOSIS — S61451A Open bite of right hand, initial encounter: Secondary | ICD-10-CM | POA: Diagnosis not present

## 2021-11-27 DIAGNOSIS — W540XXA Bitten by dog, initial encounter: Secondary | ICD-10-CM | POA: Diagnosis not present

## 2021-11-27 MED ORDER — AMOXICILLIN-POT CLAVULANATE 875-125 MG PO TABS: 1.0000 | ORAL_TABLET | Freq: Two times a day (BID) | ORAL | 0 refills | Status: DC

## 2021-11-27 NOTE — Assessment & Plan Note (Signed)
Acute Bitten by a dog 3 days ago and there was a lot of bleeding secondary to being on Xarelto Wound was cleaned well Last day or so has had increased swelling, pain and erythema Concern for possible infection Start Augmentin 875-125 mg twice daily x10 days Tdap up-to-date He will monitor this closely let me know if there is any worsening/no improvement

## 2021-11-27 NOTE — Progress Notes (Signed)
    Subjective:    Patient ID: Elijah [name], male    DOB: [birth year], 71 y.o.   MRN: [id number]      HPI Elijah [name] is here for  Chief Complaint  Patient presents with   Animal Bite    Right hand (dog bite); Saturday afternoon    Saturday afternoon, he was bitten on his right middle and index finger by a dog.  He did pull his hand back as it was more of a scrape and the bite was not too deep.  The dog is 32 months old and up-to-date with his shots.  It was not a malicious bite-he was giving him a treat.  There was a lot of bleeding because he is on Xarelto.  Initially it looked good and him and his wife did clean the wound well.  The last day or so it has become more swollen and painful.  There is no discharge.  He does have mild bleeding intermittently.  Denies any fevers or chills.  Tdap up to date     Medications and allergies reviewed with patient and updated if appropriate.  Current Outpatient Medications on File Prior to Visit  Medication Sig Dispense Refill   Cholecalciferol (VITAMIN D3) 50 MCG (2000 UT) TABS Take 50 mcg by mouth once.     levothyroxine (SYNTHROID) 137 MCG tablet Take 1 tablet (137 mcg total) by mouth daily before breakfast. 90 tablet 3   Omega-3 Fatty Acids (FISH OIL PO) Take 2 capsules by mouth daily.     rivaroxaban (XARELTO) 20 MG TABS tablet Take 1 tablet (20 mg total) by mouth daily with supper. 90 tablet 3   No current facility-administered medications on file prior to visit.    Review of Systems  Constitutional:  Negative for chills and fever.  Skin:  Positive for wound.       Objective:   Vitals:   11/27/21 1035  BP: 126/80  Pulse: 71  Temp: 97.7 F (36.5 C)  SpO2: 99%   BP Readings from Last 3 Encounters:  11/27/21 126/80  09/24/21 136/86  08/27/21 121/89   Wt Readings from Last 3 Encounters:  11/27/21 254 lb (115.2 kg)  09/24/21 251 lb (113.9 kg)  08/27/21 243 lb (110.2 kg)   Body mass index is 31.75 kg/m.     Physical Exam Constitutional:      General: He is not in acute distress.    Appearance: Normal appearance.  HENT:     Head: Normocephalic and atraumatic.  Skin:    General: Skin is warm and dry.     Comments: Right index finger with a couple of very mild superficial lacerations that have closed and have mild scabbing without surrounding erythema, bleeding or discharge.  Right middle finger with 5 cm laceration that is superficial, jagged.  Minimal bleeding in 1 area.  No pus discharge.  There is surrounding erythema.  The finger is swollen and tender.  Normal sensation in both fingers.  Decreased flexion secondary to swelling and pain.  Neurological:     Mental Status: He is alert.            Assessment & Plan:    See Problem List for Assessment and Plan of chronic medical problems.

## 2021-11-27 NOTE — Patient Instructions (Addendum)
      Medications changes include :   Augmentin twice daily for 10 days   Your prescription(s) have been sent to your pharmacy.    Monitor the area closely.    Return if symptoms worsen or fail to improve.

## 2022-03-19 DIAGNOSIS — H5213 Myopia, bilateral: Secondary | ICD-10-CM | POA: Diagnosis not present

## 2022-03-19 DIAGNOSIS — H2513 Age-related nuclear cataract, bilateral: Secondary | ICD-10-CM | POA: Diagnosis not present

## 2022-04-03 ENCOUNTER — Ambulatory Visit (INDEPENDENT_AMBULATORY_CARE_PROVIDER_SITE_OTHER): Payer: Medicare PPO

## 2022-04-03 DIAGNOSIS — Z0000 Encounter for general adult medical examination without abnormal findings: Secondary | ICD-10-CM | POA: Diagnosis not present

## 2022-04-03 NOTE — Patient Instructions (Addendum)
Elijah [name] , Thank you for taking time to come for your Medicare Wellness Visit. I appreciate your ongoing commitment to your health goals. Please review the following plan we discussed and let me know if I can assist you in the future.   These are the goals we discussed:  Goals      To maintain my current health status by continuing to eat healthy, stay physically active and socially active.        This is a list of the screening recommended for you and due dates:  Health Maintenance  Topic Date Due   Zoster (Shingles) Vaccine (1 of 2) Never done   COVID-19 Vaccine (5 - Pfizer series) 05/06/2022   Tetanus Vaccine  12/24/2025   Colon Cancer Screening  08/28/2026   Pneumonia Vaccine  Completed   Flu Shot  Completed   Hepatitis C Screening: USPSTF Recommendation to screen - Ages 18-79 yo.  Completed   HPV Vaccine  Aged Out    Advanced directives: Yes  Conditions/risks identified: Yes  Next appointment: Follow up in one year for your annual wellness visit.   Preventive Care 39 Years and Older, Male  Preventive care refers to lifestyle choices and visits with your health care provider that can promote health and wellness. What does preventive care include? A yearly physical exam. This is also called an annual well check. Dental exams once or twice a year. Routine eye exams. Ask your health care provider how often you should have your eyes checked. Personal lifestyle choices, including: Daily care of your teeth and gums. Regular physical activity. Eating a healthy diet. Avoiding tobacco and drug use. Limiting alcohol use. Practicing safe sex. Taking low doses of aspirin every day. Taking vitamin and mineral supplements as recommended by your health care provider. What happens during an annual well check? The services and screenings done by your health care provider during your annual well check will depend on your age, overall health, lifestyle risk factors, and family  history of disease. Counseling  Your health care provider may ask you questions about your: Alcohol use. Tobacco use. Drug use. Emotional well-being. Home and relationship well-being. Sexual activity. Eating habits. History of falls. Memory and ability to understand (cognition). Work and work Statistician. Screening  You may have the following tests or measurements: Height, weight, and BMI. Blood pressure. Lipid and cholesterol levels. These may be checked every 5 years, or more frequently if you are over 81 years old. Skin check. Lung cancer screening. You may have this screening every year starting at age 53 if you have a 30-pack-year history of smoking and currently smoke or have quit within the past 15 years. Fecal occult blood test (FOBT) of the stool. You may have this test every year starting at age 27. Flexible sigmoidoscopy or colonoscopy. You may have a sigmoidoscopy every 5 years or a colonoscopy every 10 years starting at age 62. Prostate cancer screening. Recommendations will vary depending on your family history and other risks. Hepatitis C blood test. Hepatitis B blood test. Sexually transmitted disease (STD) testing. Diabetes screening. This is done by checking your blood sugar (glucose) after you have not eaten for a while (fasting). You may have this done every 1-3 years. Abdominal aortic aneurysm (AAA) screening. You may need this if you are a current or former smoker. Osteoporosis. You may be screened starting at age 42 if you are at high risk. Talk with your health care provider about your test results, treatment options, and  if necessary, the need for more tests. Vaccines  Your health care provider may recommend certain vaccines, such as: Influenza vaccine. This is recommended every year. Tetanus, diphtheria, and acellular pertussis (Tdap, Td) vaccine. You may need a Td booster every 10 years. Zoster vaccine. You may need this after age 10. Pneumococcal  13-valent conjugate (PCV13) vaccine. One dose is recommended after age 38. Pneumococcal polysaccharide (PPSV23) vaccine. One dose is recommended after age 73. Talk to your health care provider about which screenings and vaccines you need and how often you need them. This information is not intended to replace advice given to you by your health care provider. Make sure you discuss any questions you have with your health care provider. Document Released: 06/30/2015 Document Revised: 02/21/2016 Document Reviewed: 04/04/2015 Elsevier Interactive Patient Education  2017 [place] Prevention in the Home Falls can cause injuries. They can happen to people of all ages. There are many things you can do to make your home safe and to help prevent falls. What can I do on the outside of my home? Regularly fix the edges of walkways and driveways and fix any cracks. Remove anything that might make you trip as you walk through a door, such as a raised step or threshold. Trim any bushes or trees on the path to your home. Use bright outdoor lighting. Clear any walking paths of anything that might make someone trip, such as rocks or tools. Regularly check to see if handrails are loose or broken. Make sure that both sides of any steps have handrails. Any raised decks and porches should have guardrails on the edges. Have any leaves, snow, or ice cleared regularly. Use sand or salt on walking paths during winter. Clean up any spills in your garage right away. This includes oil or grease spills. What can I do in the bathroom? Use night lights. Install grab bars by the toilet and in the tub and shower. Do not use towel bars as grab bars. Use non-skid mats or decals in the tub or shower. If you need to sit down in the shower, use a plastic, non-slip stool. Keep the floor dry. Clean up any water that spills on the floor as soon as it happens. Remove soap buildup in the tub or shower regularly. Attach  bath mats securely with double-sided non-slip rug tape. Do not have throw rugs and other things on the floor that can make you trip. What can I do in the bedroom? Use night lights. Make sure that you have a light by your bed that is easy to reach. Do not use any sheets or blankets that are too big for your bed. They should not hang down onto the floor. Have a firm chair that has side arms. You can use this for support while you get dressed. Do not have throw rugs and other things on the floor that can make you trip. What can I do in the kitchen? Clean up any spills right away. Avoid walking on wet floors. Keep items that you use a lot in easy-to-reach places. If you need to reach something above you, use a strong step stool that has a grab bar. Keep electrical cords out of the way. Do not use floor polish or wax that makes floors slippery. If you must use wax, use non-skid floor wax. Do not have throw rugs and other things on the floor that can make you trip. What can I do with my stairs? Do not leave any  items on the stairs. Make sure that there are handrails on both sides of the stairs and use them. Fix handrails that are broken or loose. Make sure that handrails are as long as the stairways. Check any carpeting to make sure that it is firmly attached to the stairs. Fix any carpet that is loose or worn. Avoid having throw rugs at the top or bottom of the stairs. If you do have throw rugs, attach them to the floor with carpet tape. Make sure that you have a light switch at the top of the stairs and the bottom of the stairs. If you do not have them, ask someone to add them for you. What else can I do to help prevent falls? Wear shoes that: Do not have high heels. Have rubber bottoms. Are comfortable and fit you well. Are closed at the toe. Do not wear sandals. If you use a stepladder: Make sure that it is fully opened. Do not climb a closed stepladder. Make sure that both sides of the  stepladder are locked into place. Ask someone to hold it for you, if possible. Clearly mark and make sure that you can see: Any grab bars or handrails. First and last steps. Where the edge of each step is. Use tools that help you move around (mobility aids) if they are needed. These include: Canes. Walkers. Scooters. Crutches. Turn on the lights when you go into a dark area. Replace any light bulbs as soon as they burn out. Set up your furniture so you have a clear path. Avoid moving your furniture around. If any of your floors are uneven, fix them. If there are any pets around you, be aware of where they are. Review your medicines with your doctor. Some medicines can make you feel dizzy. This can increase your chance of falling. Ask your doctor what other things that you can do to help prevent falls. This information is not intended to replace advice given to you by your health care provider. Make sure you discuss any questions you have with your health care provider. Document Released: 03/30/2009 Document Revised: 11/09/2015 Document Reviewed: 07/08/2014 Elsevier Interactive Patient Education  2017 [employer].

## 2022-04-03 NOTE — Progress Notes (Signed)
Virtual Visit via Telephone Note  I connected with  Elijah [name] on 04/03/22 at 11:30 AM EDT by telephone and verified that I am speaking with the correct person using two identifiers.  Location: Patient: Home  Provider: [place] Persons participating in the virtual visit: [place]   I discussed the limitations, risks, security and privacy concerns of performing an evaluation and management service by telephone and the availability of in person appointments. The patient expressed understanding and agreed to proceed.  Interactive audio and video telecommunications were attempted between this nurse and patient, however failed, due to patient having technical difficulties OR patient did not have access to video capability.  We continued and completed visit with audio only.  Some vital signs may be absent or patient reported.   Sheral Flow, LPN  Subjective:   Elijah [name] is a 71 y.o. male who presents for Medicare Annual/Subsequent preventive examination.  Review of Systems     Cardiac Risk Factors include: advanced age (>4mn, >>50women);dyslipidemia;family history of premature cardiovascular disease;male gender     Objective:    There were no vitals filed for this visit. There is no height or weight on file to calculate BMI.     04/03/2022   11:50 AM 04/02/2021    9:17 AM 03/31/2020    8:38 AM 05/23/2019    1:48 PM 03/26/2019    8:30 AM 03/20/2018    3:27 PM 04/06/2015    8:15 AM  Advanced Directives  Does Patient Have a Medical Advance Directive? Yes Yes Yes Yes Yes Yes Yes  Type of AParamedicof [place] will Living will;Healthcare Power of Attorney Living will;Healthcare Power of [place] will [place] will   Does patient want to make changes to medical advance directive?  No - Patient declined  No - Patient declined      Copy of [place] Chart? No - copy requested No - copy requested No - copy requested  No - copy requested No - copy requested     Current Medications (verified) Outpatient Encounter Medications as of 04/03/2022  Medication Sig   Cholecalciferol (VITAMIN D3) 50 MCG (2000 UT) TABS Take 50 mcg by mouth once.   levothyroxine (SYNTHROID) 137 MCG tablet Take 1 tablet (137 mcg total) by mouth daily before breakfast.   Omega-3 Fatty Acids (FISH OIL PO) Take 2 capsules by mouth daily.   rivaroxaban (XARELTO) 20 MG TABS tablet Take 1 tablet (20 mg total) by mouth daily with supper.   [DISCONTINUED] amoxicillin-clavulanate (AUGMENTIN) 875-125 MG tablet Take 1 tablet by mouth 2 (two) times daily.   No facility-administered encounter medications on file as of 04/03/2022.    Allergies (verified) Patient has no known allergies.   History: Past Medical History:  Diagnosis Date   Adenomatous colon polyp    Arthritis    mild thumbs   Atrial fibrillation ([place] 06/17/2010   Dr GCristopher Peruseen as OP   Atrial fibrillation ([place] 11/22-23/2013   presentation as nausea, dizziness & chest tightness   Diverticulosis 06/17/2005   Hyperlipidemia    past hx - no issues per pt   Thyroid disease    hypothyroidism   Past Surgical History:  Procedure Laterality Date   CHEST TUBE INSERTION  1982   Spontaneous Pneumothorax   COLONOSCOPY     COLONOSCOPY W/ POLYPECTOMY  2007   Adenomatous Polyp; [facility],  Dr Olevia Perches   Holter monitor-48 hour  03/2011, 12/2010   Dr Crissie Sickles   TONSILLECTOMY     TRANSTHORACIC ECHOCARDIOGRAM   12/2010,    Family History  Problem Relation Age of Onset   Stroke Mother 45       cns aneurysm bleed X2   Breast cancer Mother    Valvular heart disease Mother        valve replacement   Hypertension Mother    Hypertension Father    Cerebral aneurysm Sister 23   Hypertension Sister    Diabetes Neg Hx    Colon cancer Neg Hx     Rectal cancer Neg Hx    Stomach cancer Neg Hx    Colon polyps Neg Hx    Esophageal cancer Neg Hx    Social History   Socioeconomic History   Marital status: Married    Spouse name: Not on file   Number of children: 2   Years of education: Not on file   Highest education level: Not on file  Occupational History   Not on file  Tobacco Use   Smoking status: Never   Smokeless tobacco: Never  Vaping Use   Vaping Use: Never used  Substance and Sexual Activity   Alcohol use: No   Drug use: No   Sexual activity: Never  Other Topics Concern   Not on file  Social History Narrative   Exercise: 5 days of aerobic, two days of weights   Social Determinants of Health   Financial Resource Strain: Low Risk  (04/03/2022)   Overall Financial Resource Strain (CARDIA)    Difficulty of Paying Living Expenses: Not hard at all  Food Insecurity: No Food Insecurity (04/03/2022)   Hunger Vital Sign    Worried About Running Out of Food in the Last Year: Never true    Ran Out of Food in the Last Year: Never true  Transportation Needs: No Transportation Needs (04/03/2022)   PRAPARE - Hydrologist (Medical): No    Lack of Transportation (Non-Medical): No  Physical Activity: Sufficiently Active (04/03/2022)   Exercise Vital Sign    Days of Exercise per Week: 7 days    Minutes of Exercise per Session: 30 min  Stress: No Stress Concern Present (04/03/2022)   [place]    Feeling of Stress : Not at all  Social Connections: [place] (04/03/2022)   Social Connection and Isolation Panel [NHANES]    Frequency of Communication with Friends and Family: More than three times a week    Frequency of Social Gatherings with Friends and Family: More than three times a week    Attends Religious Services: More than 4 times per year    Active Member of [employer] or Organizations: Yes    Attends Programme researcher, broadcasting/film/video: More than 4 times per year    Marital Status: Married    Tobacco Counseling Counseling given: Not Answered   Clinical Intake:  Pre-visit preparation completed: Yes  Pain : No/denies pain     BMI - recorded: 31.75 (11/27/2021) Nutritional Status: BMI > 30  Obese Nutritional Risks: None Diabetes: No  How often do you need to have someone help you when you read instructions, pamphlets, or other written materials from your doctor or pharmacy?: 1 - Never What is the last grade level you completed in school?: HSG  Diabetic? no  Interpreter Needed?: No  Information entered by ::  [name], LPN.   Activities of Daily Living    04/03/2022   12:29 PM  In your present state of health, do you have any difficulty performing the following activities:  Hearing? 0  Vision? 0  Difficulty concentrating or making decisions? 0  Walking or climbing stairs? 0  Dressing or bathing? 0  Doing errands, shopping? 0  Preparing Food and eating ? N  Using the Toilet? N  In the past six months, have you accidently leaked urine? N  Do you have problems with loss of bowel control? N  Managing your Medications? N  Managing your Finances? N  Housekeeping or managing your Housekeeping? N    Patient Care Team: Binnie Rail, MD as PCP - General (Internal Medicine) Armbruster, Carlota Raspberry, MD as Consulting Physician (Gastroenterology) Evans Lance, MD as Consulting Physician (Cardiology) Luberta Mutter, MD as Consulting Physician (Ophthalmology)  Indicate any recent Medical Services you may have received from other than [facility] providers in the past year (date may be approximate).     Assessment:   This is a routine wellness examination for Ro[place].  Hearing/Vision screen Hearing Screening - Comments:: Denies hearing difficulties   Vision Screening - Comments:: Wears contacts - up to date with routine eye exams with Luberta Mutter, MD.   Dietary issues  and exercise activities discussed: Current Exercise Habits: Home exercise routine;Structured exercise class, Type of exercise: walking;treadmill;stretching;strength training/weights;exercise ball;calisthenics, Time (Minutes): 60, Frequency (Times/Week): 7, Weekly Exercise (Minutes/Week): 420, Intensity: Moderate, Exercise limited by: None identified   Goals Addressed   None   Depression Screen    04/03/2022   11:40 AM 11/27/2021   10:46 AM 08/01/2021    8:07 AM 04/02/2021    9:15 AM 03/31/2020    8:36 AM 03/26/2019    7:49 AM 03/20/2018    2:37 PM  PHQ 2/9 Scores  PHQ - 2 Score 0 0 0 0 0 0 0  PHQ- 9 Score  0     0    Fall Risk    04/03/2022   11:38 AM 11/27/2021   10:46 AM 08/01/2021    9:05 AM 04/02/2021    9:18 AM 03/31/2020    8:38 AM  Fall Risk   Falls in the past year? 0 1 1 0 0  Number falls in past yr: 0 1 1 0 0  Injury with Fall? 0 0 0 0 0  Risk for fall due to : No Fall Risks No Fall Risks No Fall Risks No Fall Risks No Fall Risks  Follow up Falls prevention discussed Falls evaluation completed Falls evaluation completed Falls evaluation completed Falls evaluation completed    FALL RISK PREVENTION PERTAINING TO THE HOME:  Any stairs in or around the home? Yes  If so, are there any without handrails? No  Home free of loose throw rugs in walkways, pet beds, electrical cords, etc? Yes  Adequate lighting in your home to reduce risk of falls? Yes   ASSISTIVE DEVICES UTILIZED TO PREVENT FALLS:  Life alert? No  Use of a cane, walker or w/c? No  Grab bars in the bathroom? No  Shower chair or bench in shower? Yes  Elevated toilet seat or a handicapped toilet? Yes   TIMED UP AND GO:  Was the test performed? No . Phone Visit   Cognitive Function:        04/03/2022   12:30 PM  6CIT Screen  What Year? 0 points  What month? 0 points  What time?  0 points  Count back from 20 0 points  Months in reverse 0 points  Repeat phrase 0 points  Total Score 0 points     Immunizations Immunization History  Administered Date(s) Administered   Fluad Quad(high Dose 65+) 03/13/2019, 04/03/2020   Influenza Split 05/07/2012   Influenza, High Dose Seasonal PF 03/14/2017, 03/20/2018   Influenza-Unspecified 04/24/2016, 04/02/2022   PFIZER Comirnaty(Gray Top)Covid-19 Tri-Sucrose Vaccine 03/11/2022   PFIZER(Purple Top)SARS-COV-2 Vaccination 07/07/2019, 07/28/2019, 03/15/2020   Pneumococcal Conjugate-13 03/14/2017   Pneumococcal Polysaccharide-23 03/20/2018   Tdap 01/15/2005, 12/25/2015   Zoster, Live 03/31/2012    TDAP status: Up to date  Flu Vaccine status: Up to date  Pneumococcal vaccine status: Up to date  Covid-19 vaccine status: Completed vaccines  Qualifies for Shingles Vaccine? Yes   Zostavax completed Yes   Shingrix Completed?: No.    Education has been provided regarding the importance of this vaccine. Patient has been advised to call insurance company to determine out of pocket expense if they have not yet received this vaccine. Advised may also receive vaccine at local pharmacy or Health Dept. Verbalized acceptance and understanding.  Screening Tests Health Maintenance  Topic Date Due   Zoster Vaccines- Shingrix (1 of 2) Never done   COVID-19 Vaccine (5 - Pfizer series) 05/06/2022   TETANUS/TDAP  12/24/2025   COLONOSCOPY (Pts 45-27yr Insurance coverage will need to be confirmed)  08/28/2026   Pneumonia Vaccine 71 Years old  Completed   INFLUENZA VACCINE  Completed   Hepatitis C Screening  Completed   HPV VACCINES  Aged Out    Health Maintenance  Health Maintenance Due  Topic Date Due   Zoster Vaccines- Shingrix (1 of 2) Never done    Colorectal cancer screening: Type of screening: Colonoscopy. Completed 08/27/2021. Repeat every 5 years  Lung Cancer Screening: (Low Dose CT Chest recommended if Age 71-80years, 30 pack-year currently smoking OR have quit w/in 15years.) does not qualify.   Lung Cancer Screening Referral:  no  Additional Screening:  Hepatitis C Screening: does qualify; Completed 03/20/2018  Vision Screening: Recommended annual ophthalmology exams for early detection of glaucoma and other disorders of the eye. Is the patient up to date with their annual eye exam?  Yes  Who is the provider or what is the name of the office in which the patient attends annual eye exams? CLuberta Mutter MD. If pt is not established with a provider, would they like to be referred to a provider to establish care? No .   Dental Screening: Recommended annual dental exams for proper oral hygiene  Community Resource Referral / Chronic Care Management: CRR required this visit?  No   CCM required this visit?  No      Plan:     I have personally reviewed and noted the following in the patient's chart:   Medical and social history Use of alcohol, tobacco or illicit drugs  Current medications and supplements including opioid prescriptions. Patient is not currently taking opioid prescriptions. Functional ability and status Nutritional status Physical activity Advanced directives List of other physicians Hospitalizations, surgeries, and ER visits in previous 12 months Vitals Screenings to include cognitive, depression, and falls Referrals and appointments  In addition, I have reviewed and discussed with patient certain preventive protocols, quality metrics, and best practice recommendations. A written personalized care plan for preventive services as well as general preventive health recommendations were provided to patient.     SSheral Flow LPN   [id number]  Nurse Notes:  N/A

## 2022-04-08 ENCOUNTER — Encounter: Payer: Self-pay | Admitting: Internal Medicine

## 2022-04-08 NOTE — Patient Instructions (Addendum)
Blood work was ordered.   The lab is on the first floor.    Medications changes include :   None   A CT coronary calcium score test was ordered.  Someone will call you to schedule this.    Return in about 1 year (around 04/10/2023) for Physical Exam.     Health Maintenance, Male Adopting a healthy lifestyle and getting preventive care are important in promoting health and wellness. Ask your health care provider about: The right schedule for you to have regular tests and exams. Things you can do on your own to prevent diseases and keep yourself healthy. What should I know about diet, weight, and exercise? Eat a healthy diet  Eat a diet that includes plenty of vegetables, fruits, low-fat dairy products, and lean protein. Do not eat a lot of foods that are high in solid fats, added sugars, or sodium. Maintain a healthy weight Body mass index (BMI) is a measurement that can be used to identify possible weight problems. It estimates body fat based on height and weight. Your health care provider can help determine your BMI and help you achieve or maintain a healthy weight. Get regular exercise Get regular exercise. This is one of the most important things you can do for your health. Most adults should: Exercise for at least 150 minutes each week. The exercise should increase your heart rate and make you sweat (moderate-intensity exercise). Do strengthening exercises at least twice a week. This is in addition to the moderate-intensity exercise. Spend less time sitting. Even light physical activity can be beneficial. Watch cholesterol and blood lipids Have your blood tested for lipids and cholesterol at 71 years of age, then have this test every 5 years. You may need to have your cholesterol levels checked more often if: Your lipid or cholesterol levels are high. You are older than 70 years of age. You are at high risk for heart disease. What should I know about cancer  screening? Many types of cancers can be detected early and may often be prevented. Depending on your health history and family history, you may need to have cancer screening at various ages. This may include screening for: Colorectal cancer. Prostate cancer. Skin cancer. Lung cancer. What should I know about heart disease, diabetes, and high blood pressure? Blood pressure and heart disease High blood pressure causes heart disease and increases the risk of stroke. This is more likely to develop in people who have high blood pressure readings or are overweight. Talk with your health care provider about your target blood pressure readings. Have your blood pressure checked: Every 3-5 years if you are 56-64 years of age. Every year if you are 73 years old or older. If you are between the ages of 35 and 66 and are a current or former smoker, ask your health care provider if you should have a one-time screening for abdominal aortic aneurysm (AAA). Diabetes Have regular diabetes screenings. This checks your fasting blood sugar level. Have the screening done: Once every three years after age 64 if you are at a normal weight and have a low risk for diabetes. More often and at a younger age if you are overweight or have a high risk for diabetes. What should I know about preventing infection? Hepatitis B If you have a higher risk for hepatitis B, you should be screened for this virus. Talk with your health care provider to find out if you are at risk for hepatitis  B infection. Hepatitis C Blood testing is recommended for: Everyone born from [birth year] through 1965. Anyone with known risk factors for hepatitis C. Sexually transmitted infections (STIs) You should be screened each year for STIs, including gonorrhea and chlamydia, if: You are sexually active and are younger than 71 years of age. You are older than 71 years of age and your health care provider tells you that you are at risk for this type of  infection. Your sexual activity has changed since you were last screened, and you are at increased risk for chlamydia or gonorrhea. Ask your health care provider if you are at risk. Ask your health care provider about whether you are at high risk for HIV. Your health care provider may recommend a prescription medicine to help prevent HIV infection. If you choose to take medicine to prevent HIV, you should first get tested for HIV. You should then be tested every 3 months for as long as you are taking the medicine. Follow these instructions at home: Alcohol use Do not drink alcohol if your health care provider tells you not to drink. If you drink alcohol: Limit how much you have to 0-2 drinks a day. Know how much alcohol is in your drink. In the [place] one drink equals one 12 oz bottle of beer (355 mL), one 5 oz glass of wine (148 mL), or one 1 oz glass of hard liquor (44 mL). Lifestyle Do not use any products that contain nicotine or tobacco. These products include cigarettes, chewing tobacco, and vaping devices, such as e-cigarettes. If you need help quitting, ask your health care provider. Do not use street drugs. Do not share needles. Ask your health care provider for help if you need support or information about quitting drugs. General instructions Schedule regular health, dental, and eye exams. Stay current with your vaccines. Tell your health care provider if: You often feel depressed. You have ever been abused or do not feel safe at home. Summary Adopting a healthy lifestyle and getting preventive care are important in promoting health and wellness. Follow your health care provider's instructions about healthy diet, exercising, and getting tested or screened for diseases. Follow your health care provider's instructions on monitoring your cholesterol and blood pressure. This information is not intended to replace advice given to you by your health care provider. Make sure you discuss  any questions you have with your health care provider. Document Revised: 10/23/2020 Document Reviewed: 10/23/2020 Elsevier Patient Education  [place].

## 2022-04-08 NOTE — Progress Notes (Signed)
Subjective:    Patient ID: Elijah [name], male    DOB: [birth year], 71 y.o.   MRN: [id number]     HPI Zeric is here for a physical exam.   Overall feeling good.  Denies any big changes.  Has no concerns.   Medications and allergies reviewed with patient and updated if appropriate.  Current Outpatient Medications on File Prior to Visit  Medication Sig Dispense Refill   Cholecalciferol (VITAMIN D3) 50 MCG (2000 UT) TABS Take 50 mcg by mouth once.     levothyroxine (SYNTHROID) 137 MCG tablet Take 1 tablet (137 mcg total) by mouth daily before breakfast. 90 tablet 3   Omega-3 Fatty Acids (FISH OIL PO) Take 2 capsules by mouth daily.     rivaroxaban (XARELTO) 20 MG TABS tablet Take 1 tablet (20 mg total) by mouth daily with supper. 90 tablet 3   No current facility-administered medications on file prior to visit.    Review of Systems  Constitutional:  Negative for chills and fever.  Eyes:  Negative for visual disturbance.  Respiratory:  Negative for cough, shortness of breath and wheezing.   Cardiovascular:  Positive for leg swelling. Negative for chest pain and palpitations.  Gastrointestinal:  Negative for abdominal pain, blood in stool, constipation, diarrhea and nausea.       No gerd  Genitourinary:  Positive for frequency. Negative for difficulty urinating, dysuria and hematuria.  Musculoskeletal:  Negative for arthralgias and back pain.  Skin:  Negative for rash.  Neurological:  Negative for light-headedness and headaches.  Psychiatric/Behavioral:  Negative for dysphoric mood. The patient is not nervous/anxious.        Objective:   Vitals:   04/09/22 1047  BP: 124/72  Pulse: 81  Temp: 98.3 F (36.8 C)  SpO2: 98%   Filed Weights   04/09/22 1047  Weight: 258 lb (117 kg)   Body mass index is 32.25 kg/m.  BP Readings from Last 3 Encounters:  04/09/22 124/72  11/27/21 126/80  09/24/21 136/86    Wt Readings from Last 3 Encounters:  04/09/22 258 lb  (117 kg)  11/27/21 254 lb (115.2 kg)  09/24/21 251 lb (113.9 kg)      Physical Exam Constitutional: He appears well-developed and well-nourished. No distress.  HENT:  Head: Normocephalic and atraumatic.  Right Ear: External ear normal.  Left Ear: External ear normal.  Mouth/Throat: Oropharynx is clear and moist.  Normal ear canals and TM b/l  Eyes: Conjunctivae and EOM are normal.  Neck: Neck supple. No tracheal deviation present. No thyromegaly present.  No carotid bruit  Cardiovascular: Normal rate, irregular rhythm, normal heart sounds and intact distal pulses.  No murmur heard.  Bilateral lower extremity varicose veins with mild bilateral lower extremity nonpitting edema Pulmonary/Chest: Effort normal and breath sounds normal. No respiratory distress. He has no wheezes. He has no rales.  Abdominal: Soft. He exhibits no distension. There is no tenderness.  Genitourinary: deferred  Lymphadenopathy:   He has no cervical adenopathy.  Skin: Skin is warm and dry. He is not diaphoretic.  Psychiatric: He has a normal mood and affect. His behavior is normal.         Assessment & Plan:   Physical exam: Screening blood work  ordered Exercise   regular - walking, weights Weight knows he should lose weight Substance abuse   none   Reviewed recommended immunizations.   Health Maintenance  Topic Date Due   Zoster Vaccines- Shingrix (1 of 2) Never  done   COVID-19 Vaccine (5 - Pfizer series) 05/06/2022   Medicare Annual Wellness (AWV)  05/04/2023   TETANUS/TDAP  12/24/2025   COLONOSCOPY (Pts 45-63yr Insurance coverage will need to be confirmed)  08/28/2026   Pneumonia Vaccine 71 Years old  Completed   INFLUENZA VACCINE  Completed   Hepatitis C Screening  Completed   HPV VACCINES  Aged Out     See Problem List for Assessment and Plan of chronic medical problems.

## 2022-04-09 ENCOUNTER — Ambulatory Visit (INDEPENDENT_AMBULATORY_CARE_PROVIDER_SITE_OTHER): Payer: Medicare PPO | Admitting: Internal Medicine

## 2022-04-09 VITALS — BP 124/72 | HR 81 | Temp 98.3°F | Ht 75.0 in | Wt 258.0 lb

## 2022-04-09 DIAGNOSIS — Z0000 Encounter for general adult medical examination without abnormal findings: Secondary | ICD-10-CM

## 2022-04-09 DIAGNOSIS — E039 Hypothyroidism, unspecified: Secondary | ICD-10-CM | POA: Diagnosis not present

## 2022-04-09 DIAGNOSIS — Z125 Encounter for screening for malignant neoplasm of prostate: Secondary | ICD-10-CM | POA: Diagnosis not present

## 2022-04-09 DIAGNOSIS — I482 Chronic atrial fibrillation, unspecified: Secondary | ICD-10-CM | POA: Diagnosis not present

## 2022-04-09 DIAGNOSIS — E782 Mixed hyperlipidemia: Secondary | ICD-10-CM | POA: Diagnosis not present

## 2022-04-09 LAB — COMPREHENSIVE METABOLIC PANEL
ALT: 12 U/L (ref 0–53)
AST: 32 U/L (ref 0–37)
Albumin: 4.4 g/dL (ref 3.5–5.2)
Alkaline Phosphatase: 72 U/L (ref 39–117)
BUN: 21 mg/dL (ref 6–23)
CO2: 28 mEq/L (ref 19–32)
Calcium: 9.7 mg/dL (ref 8.4–10.5)
Chloride: 102 mEq/L (ref 96–112)
Creatinine, Ser: 1.08 mg/dL (ref 0.40–1.50)
GFR: 69.37 mL/min (ref >60.00)
Glucose, Bld: 90 mg/dL (ref 70–99)
Potassium: 4.3 mEq/L (ref 3.5–5.1)
Sodium: 136 mEq/L (ref 135–145)
Total Bilirubin: 1 mg/dL (ref 0.2–1.2)
Total Protein: 7.6 g/dL (ref 6.0–8.3)

## 2022-04-09 LAB — PSA, MEDICARE: PSA: 2.36 ng/ml (ref 0.10–4.00)

## 2022-04-09 LAB — LIPID PANEL
Cholesterol: 191 mg/dL (ref 0–200)
HDL: 45.7 mg/dL (ref >39.00)
LDL Cholesterol: 128 mg/dL — ABNORMAL HIGH (ref 0–99)
NonHDL: 145.28
Total CHOL/HDL Ratio: 4
Triglycerides: 88 mg/dL (ref 0.0–149.0)
VLDL: 17.6 mg/dL (ref 0.0–40.0)

## 2022-04-09 LAB — CBC WITH DIFFERENTIAL/PLATELET
Basophils Absolute: 0.1 10*3/uL (ref 0.0–0.1)
Basophils Relative: 1 % (ref 0.0–3.0)
Eosinophils Absolute: 0.1 10*3/uL (ref 0.0–0.7)
Eosinophils Relative: 1.6 % (ref 0.0–5.0)
HCT: 49.7 % (ref 39.0–52.0)
Hemoglobin: 16.3 g/dL (ref 13.0–17.0)
Lymphocytes Relative: 35.6 % (ref 12.0–46.0)
Lymphs Abs: 2.5 10*3/uL (ref 0.7–4.0)
MCHC: 32.8 g/dL (ref 30.0–36.0)
MCV: 90.7 fl (ref 78.0–100.0)
Monocytes Absolute: 0.8 10*3/uL (ref 0.1–1.0)
Monocytes Relative: 11.2 % (ref 3.0–12.0)
Neutro Abs: 3.6 10*3/uL (ref 1.4–7.7)
Neutrophils Relative %: 50.6 % (ref 43.0–77.0)
Platelets: 172 10*3/uL (ref 150.0–400.0)
RBC: 5.48 Mil/uL (ref 4.22–5.81)
RDW: 13.7 % (ref 11.5–15.5)
WBC: 7.1 10*3/uL (ref 4.0–10.5)

## 2022-04-09 LAB — TSH: TSH: 1.59 u[IU]/mL (ref 0.35–5.50)

## 2022-04-09 NOTE — Assessment & Plan Note (Signed)
Chronic  Clinically euthyroid Check tsh and will titrate med dose if needed Currently taking levothyroxine 137 mcg daily 

## 2022-04-09 NOTE — Assessment & Plan Note (Signed)
Chronic Following with Dr. Lovena Le On Xarelto 20 mg daily CBC, CMP, TSH

## 2022-04-09 NOTE — Assessment & Plan Note (Addendum)
Chronic Regular exercise and healthy diet encouraged Check lipid panel  Continue lifestyle control-discussed CT coronary calcium score to assess risk and he agrees-ordered-we will reassess his need for statin after the test

## 2022-04-11 ENCOUNTER — Encounter: Payer: Self-pay | Admitting: Internal Medicine

## 2022-04-19 ENCOUNTER — Encounter: Payer: Self-pay | Admitting: Internal Medicine

## 2022-04-19 ENCOUNTER — Ambulatory Visit (HOSPITAL_COMMUNITY)
Admission: RE | Admit: 2022-04-19 | Discharge: 2022-04-19 | Disposition: A | Discharge status: Home / Self Care | Payer: Medicare PPO | Source: Ambulatory Visit | Attending: Internal Medicine | Admitting: Internal Medicine

## 2022-04-19 DIAGNOSIS — E7849 Other hyperlipidemia: Secondary | ICD-10-CM

## 2022-04-19 DIAGNOSIS — E782 Mixed hyperlipidemia: Secondary | ICD-10-CM | POA: Insufficient documentation

## 2022-04-19 DIAGNOSIS — I482 Chronic atrial fibrillation, unspecified: Secondary | ICD-10-CM | POA: Insufficient documentation

## 2022-04-20 ENCOUNTER — Encounter: Payer: Self-pay | Admitting: Internal Medicine

## 2022-04-20 DIAGNOSIS — R931 Abnormal findings on diagnostic imaging of heart and coronary circulation: Secondary | ICD-10-CM | POA: Insufficient documentation

## 2022-04-20 MED ORDER — ROSUVASTATIN CALCIUM 10 MG PO TABS: 10.0000 mg | ORAL_TABLET | Freq: Every day | ORAL | 3 refills | Status: DC

## 2022-05-20 ENCOUNTER — Encounter: Payer: Self-pay | Admitting: Internal Medicine

## 2022-05-21 ENCOUNTER — Telehealth: Payer: Self-pay

## 2022-05-21 DIAGNOSIS — I83893 Varicose veins of bilateral lower extremities with other complications: Secondary | ICD-10-CM

## 2022-05-21 NOTE — Telephone Encounter (Signed)
Pt called stating that he had a varicose vein that had spontaneously started bleeding during a shower. His L ankle is now swollen and purple.  Reviewed pt's chart, returned call for clarification, two identifiers used. Pt called his PCP at the time and she advised that he f/u with this office. He states that he's been trying to wear his compression socks as much as possible, but d/t his anatomy of his calves, they tend to cut into his leg. Instructed pt to elevate properly and wrap with an ACE wrap to help decrease the swelling until he could be seen. Appt for 12/6 offered, but pt refused stating he needed an early morning appt. Appts scheduled for 12/13. Confirmed understanding.  Pt called back and changed appts to 12/6.

## 2022-05-22 ENCOUNTER — Ambulatory Visit (HOSPITAL_COMMUNITY)
Admission: RE | Admit: 2022-05-22 | Discharge: 2022-05-22 | Disposition: A | Discharge status: Home / Self Care | Payer: Medicare PPO | Source: Ambulatory Visit | Attending: Vascular Surgery | Admitting: Vascular Surgery

## 2022-05-22 ENCOUNTER — Encounter: Payer: Self-pay | Admitting: Vascular Surgery

## 2022-05-22 ENCOUNTER — Ambulatory Visit: Payer: Medicare PPO | Admitting: Vascular Surgery

## 2022-05-22 VITALS — BP 133/90 | HR 70 | Temp 97.9°F | Resp 16 | Ht 75.0 in | Wt 259.0 lb

## 2022-05-22 DIAGNOSIS — I83893 Varicose veins of bilateral lower extremities with other complications: Secondary | ICD-10-CM | POA: Diagnosis not present

## 2022-05-22 NOTE — Progress Notes (Addendum)
REASON FOR VISIT:   Bleeding varicose veins left leg.  MEDICAL ISSUES:   CHRONIC VENOUS INSUFFICIENCY: This patient has CEAP C4c venous disease.  He has had a significant bleeding episode from the left leg and his veins are under significant pressure.  We have again discussed the importance of intermittent leg elevation and the proper positioning for this.  He will continue to use his compression stocking but cannot get it above his calf.  I encouraged him to continue to exercise and walk.  He should avoid prolonged sitting and standing.  Given this bleeding episode I think we need to address his venous hypertension.  I think he would be a good candidate for laser ablation of the left great saphenous vein with greater than 20 stabs.  In addition, I think he would benefit from 2 units of sclerotherapy to address the telangiectasias adjacent to the area that bled.  He might subsequently be a candidate for staged laser ablation of the small saphenous vein if he continues to have issues.  With respect to the great saphenous vein I explained that I would likely cannulate this in the proximal calf.  There is an area in the mid thigh which I am concerned about.  If we cannot get through here with the wire or sheath we would have to just address the saphenous vein in the proximal thigh.  I have discussed the indications for endovenous laser ablation of the left GSV, that is to lower the pressure in the veins and potentially help relieve the symptoms from venous hypertension.  I have also discussed alternative options such as conservative treatment as described above. I have discussed the potential complications of the procedure, including bleeding, bruising, leg swelling, deep venous thrombosis (<1% risk), or failure of the vein to close <1% risk).  I have also explained that venous insufficiency is a chronic disease, and that the patient is at risk for recurrent varicose veins in the future.  All of the  patient's questions were encouraged and answered. They are agreeable to proceed.   I have discussed with the patient the indications for stab phlebectomy.  I have explained to the patient that that will have small scars from the stab incisions.  I explained that the other risks include bruising, bleeding, and phlebitis.   This patient is on Xarelto for A-fib and this would have to be held prior to the procedure.  HPI:   Elijah [name] is a pleasant 71 y.o. male who I saw back in May 2021 with painful varicose veins bilaterally.  He had C4 venous disease.  He had evidence of deep venous reflux bilaterally and also some superficial venous reflux.  We discussed conservative measures including importance of daily leg elevation, compression therapy, and exercise.  At that time I did not think he was a candidate for laser ablation of the left small saphenous vein where he had some superficial venous reflux.  He had only a short segment of reflux in the right great saphenous vein at the knee.  I was going to see him back as needed.  Since I saw him last a significant bleeding episode from his left ankle recently.  Blood was squirting under significant pressure and it took him some time to get it stopped.  He was able to ultimately successfully stop the bleeding with leg elevation and pressure.  Of note he the situation is complicated by the fact that he is on Xarelto for A-fib.  He  has advanced venous insufficiency.  He describes aching pain and heaviness in his leg which is aggravated by sitting and standing and relieved with elevation.  He does elevate his legs daily.  He is unable to get compression stockings that fit correctly as he has very large calves.  He has to use knee-high stockings and warm down below his calf.  Past Medical History:  Diagnosis Date   Adenomatous colon polyp    Arthritis    mild thumbs   Atrial fibrillation ([place]) 06/17/2010   Dr Cristopher Peru seen as OP   Atrial  fibrillation ([place]) 11/22-23/2013   presentation as nausea, dizziness & chest tightness   Diverticulosis 06/17/2005   Hyperlipidemia    past hx - no issues per pt   Thyroid disease    hypothyroidism    Family History  Problem Relation Age of Onset   Stroke Mother 48       cns aneurysm bleed X2   Breast cancer Mother    Valvular heart disease Mother        valve replacement   Hypertension Mother    Hypertension Father    Cerebral aneurysm Sister 60   Hypertension Sister    Diabetes Neg Hx    Colon cancer Neg Hx    Rectal cancer Neg Hx    Stomach cancer Neg Hx    Colon polyps Neg Hx    Esophageal cancer Neg Hx     SOCIAL HISTORY: Social History   Tobacco Use   Smoking status: Never   Smokeless tobacco: Never  Substance Use Topics   Alcohol use: No    No Known Allergies  Current Outpatient Medications  Medication Sig Dispense Refill   Cholecalciferol (VITAMIN D3) 50 MCG (2000 UT) TABS Take 50 mcg by mouth once.     levothyroxine (SYNTHROID) 137 MCG tablet Take 1 tablet (137 mcg total) by mouth daily before breakfast. 90 tablet 3   Omega-3 Fatty Acids (FISH OIL PO) Take 2 capsules by mouth daily.     rivaroxaban (XARELTO) 20 MG TABS tablet Take 1 tablet (20 mg total) by mouth daily with supper. 90 tablet 3   rosuvastatin (CRESTOR) 10 MG tablet Take 1 tablet (10 mg total) by mouth daily. 90 tablet 3   No current facility-administered medications for this visit.    REVIEW OF SYSTEMS:  '[X]'$  denotes positive finding, '[ ]'$  denotes negative finding Cardiac  Comments:  Chest pain or chest pressure:    Shortness of breath upon exertion:    Short of breath when lying flat:    Irregular heart rhythm:        Vascular    Pain in calf, thigh, or hip brought on by ambulation:    Pain in feet at night that wakes you up from your sleep:     Blood clot in your veins:    Leg swelling:  x       Pulmonary    Oxygen at home:    Productive cough:     Wheezing:          Neurologic    Sudden weakness in arms or legs:     Sudden numbness in arms or legs:     Sudden onset of difficulty speaking or slurred speech:    Temporary loss of vision in one eye:     Problems with dizziness:         Gastrointestinal    Blood in stool:     Vomited blood:  Genitourinary    Burning when urinating:     Blood in urine:        Psychiatric    Major depression:         Hematologic    Bleeding problems:    Problems with blood clotting too easily:        Skin    Rashes or ulcers:        Constitutional    Fever or chills:     PHYSICAL EXAM:   Vitals:   05/22/22 1448  BP: (!) 133/90  Pulse: 70  Resp: 16  Temp: 97.9 F (36.6 C)  TempSrc: Temporal  SpO2: 98%  Weight: 259 lb (117.5 kg)  Height: '6\' 3"'$  (1.905 m)    GENERAL: The patient is a well-nourished male, in no acute distress. The vital signs are documented above. CARDIAC: There is a regular rate and rhythm.  VASCULAR: I do not detect carotid bruits. He has palpable pedal pulses. He has markedly enlarged varicose veins in his posterior calf medial thigh and left leg as documented in the photographs below.  He also has Geographical information systems officer.              I did look at his left great saphenous vein and small saphenous vein myself with the SonoSite.  There is reflux in the great saphenous vein down to the mid calf.  The vein is enlarged in the proximal calf and distal thigh.  It is somewhat more difficult to follow in the mid thigh where he had an injury as a child.  Above that the artery is large with significant reflux.  The small saphenous vein was also significantly dilated.  He does appear to have a thigh extension (vein of Giacomini).  There is a connection to the popliteal vein.  PULMONARY: There is good air exchange bilaterally without wheezing or rales. ABDOMEN: Soft and non-tender with normal pitched bowel sounds.  MUSCULOSKELETAL: There are no major deformities or  cyanosis. NEUROLOGIC: No focal weakness or paresthesias are detected. SKIN: There are no ulcers or rashes noted. PSYCHIATRIC: The patient has a normal affect.  DATA:    VENOUS DUPLEX: I have independently interpreted his venous duplex scan today.  This was of the left lower extremity only.  There was no evidence of DVT.  There was deep venous reflux involving the common femoral vein, femoral vein, and popliteal vein.  There was superficial venous reflux in the left great saphenous vein from the saphenofemoral junction to the mid calf.  Diameters of the vein ranged from 4.5-6.4 mm.  There was also reflux in the small saphenous vein with diameters ranging from 3.2-4.5 mm.  The results of the test are summarized on the diagram below.    Deitra Mayo [facility] [phone]

## 2022-05-28 ENCOUNTER — Other Ambulatory Visit (INDEPENDENT_AMBULATORY_CARE_PROVIDER_SITE_OTHER): Payer: Medicare PPO

## 2022-05-28 DIAGNOSIS — E7849 Other hyperlipidemia: Secondary | ICD-10-CM | POA: Diagnosis not present

## 2022-05-28 LAB — HEPATIC FUNCTION PANEL
ALT: 15 U/L (ref 0–53)
AST: 35 U/L (ref 0–37)
Albumin: 4.2 g/dL (ref 3.5–5.2)
Alkaline Phosphatase: 64 U/L (ref 39–117)
Bilirubin, Direct: 0.2 mg/dL (ref 0.0–0.3)
Total Bilirubin: 0.8 mg/dL (ref 0.2–1.2)
Total Protein: 7.2 g/dL (ref 6.0–8.3)

## 2022-05-28 LAB — LIPID PANEL
Cholesterol: 107 mg/dL (ref 0–200)
HDL: 40.3 mg/dL (ref >39.00)
LDL Cholesterol: 56 mg/dL (ref 0–99)
NonHDL: 66.81
Total CHOL/HDL Ratio: 3
Triglycerides: 53 mg/dL (ref 0.0–149.0)
VLDL: 10.6 mg/dL (ref 0.0–40.0)

## 2022-05-29 ENCOUNTER — Encounter: Payer: Self-pay | Admitting: Internal Medicine

## 2022-05-29 ENCOUNTER — Ambulatory Visit: Payer: Medicare PPO

## 2022-05-29 ENCOUNTER — Encounter (HOSPITAL_COMMUNITY): Payer: Medicare PPO

## 2022-06-12 ENCOUNTER — Other Ambulatory Visit: Payer: Self-pay | Admitting: *Deleted

## 2022-06-12 DIAGNOSIS — I83892 Varicose veins of left lower extremities with other complications: Secondary | ICD-10-CM

## 2022-06-13 ENCOUNTER — Telehealth: Payer: Self-pay | Admitting: *Deleted

## 2022-06-13 ENCOUNTER — Other Ambulatory Visit: Payer: Self-pay | Admitting: *Deleted

## 2022-06-13 DIAGNOSIS — I10 Essential (primary) hypertension: Secondary | ICD-10-CM | POA: Insufficient documentation

## 2022-06-13 MED ORDER — LORAZEPAM 1 MG PO TABS: ORAL_TABLET | ORAL | 0 refills | Status: DC

## 2022-06-13 NOTE — Telephone Encounter (Signed)
   Patient Name: Elijah [name]  DOB: Aug 30, 1950 MRN: [id number]  Primary Cardiologist: None  Clinical pharmacists have reviewed the patient's past medical history, labs, and current medications as part of preoperative protocol coverage. The following recommendations have been made:  Patient with diagnosis of afib on Xarelto for anticoagulation.     Procedure: ENDOVENOUS LASER ABLATION (L) GREATER SAPHENOUS VEIN STAB PHLEBECTOMY > 20 INCISIONS (L) LEG    Date of procedure: 06/19/22   CHA2DS2-VASc Score = 3  This indicates a 3.2% annual risk of stroke. The patient's score is based upon: CHF History: 0 HTN History: 1 Diabetes History: 0 Stroke History: 0 Vascular Disease History: 1 Age Score: 1 Gender Score: 0   CrCl 30m/min using adjusted body weight Platelet count 172K    Per office protocol, patient can hold Xarelto for 2 days prior to procedure and 2 days after per request.   I will route this recommendation to the requesting party via [place] function and remove from pre-op pool.  Please call with questions.  ELenna Sciara NP 06/13/2022, 12:11 PM

## 2022-06-13 NOTE — Telephone Encounter (Signed)
   Pre-operative Risk Assessment    Patient Name: Elijah [name]  DOB: Mar 06, 1951 MRN: [id number]      Request for Surgical Clearance    Procedure:   ENDOVENOUS LASER ABLATION (L) GREATER SAPHENOUS VEIN STAB PHLEBECTOMY > 20 INCISIONS (L) LEG  Date of Surgery:  Clearance 06/19/22                                 Surgeon:  DR. Deitra Mayo Surgeon's Group or Practice Name:  [facility] Phone number:  [phone] Fax number:  [phone]   Type of Clearance Requested:   - Pharmacy:  Hold Rivaroxaban (Xarelto) [facility] [place]   Type of Anesthesia:   TUMESCENT ANESTHESIA    Additional requests/questions:    Astrid Divine   06/13/2022, 8:03 AM

## 2022-06-13 NOTE — Telephone Encounter (Signed)
Patient with diagnosis of afib on Xarelto for anticoagulation.    Procedure: ENDOVENOUS LASER ABLATION (L) GREATER SAPHENOUS VEIN STAB PHLEBECTOMY > 20 INCISIONS (L) LEG   Date of procedure: 06/19/22  CHA2DS2-VASc Score = 3  This indicates a 3.2% annual risk of stroke. The patient's score is based upon: CHF History: 0 HTN History: 1 Diabetes History: 0 Stroke History: 0 Vascular Disease History: 1 Age Score: 1 Gender Score: 0   CrCl 92m/min using adjusted body weight Platelet count 172K  Per office protocol, patient can hold Xarelto for 2 days prior to procedure and 2 days after per request.  **This guidance is not considered finalized until pre-operative APP has relayed final recommendations.**

## 2022-06-18 ENCOUNTER — Telehealth: Payer: Self-pay

## 2022-06-18 NOTE — Telephone Encounter (Signed)
Called Elijah Macleod RN at [facility].    F/u with office regarding request for Dr. Cristopher [name] to authorize order to hold Xarelto for two days pre, and two days post procedure.  Note found in Epic from Elijah Browner, NP, stating:  Per office protocol, patient can hold Xarelto for 2 days prior to procedure and 2 days after per request.    I will route this recommendation to the requesting party via [place] fax function and remove from pre-op pool.   Please call with questions.   Elijah Sciara, NP 06/13/2022, 12:11 PM    Message left making them aware Ms. Elijah Earthly, NP authorized holding of Xarelto, and if they have questions, Dr. Lovena [name] is in clinic today at [facility] on [facility].    No additional follow should be required at this time.

## 2022-06-19 ENCOUNTER — Ambulatory Visit: Payer: Medicare PPO | Admitting: Vascular Surgery

## 2022-06-19 ENCOUNTER — Encounter: Payer: Self-pay | Admitting: Vascular Surgery

## 2022-06-19 VITALS — BP 121/79 | HR 84 | Temp 98.8°F | Resp 18 | Ht 75.0 in | Wt 253.0 lb

## 2022-06-19 DIAGNOSIS — I872 Venous insufficiency (chronic) (peripheral): Secondary | ICD-10-CM | POA: Diagnosis not present

## 2022-06-19 DIAGNOSIS — I83892 Varicose veins of left lower extremities with other complications: Secondary | ICD-10-CM | POA: Diagnosis not present

## 2022-06-19 HISTORY — PX: ENDOVENOUS ABLATION SAPHENOUS VEIN W/ LASER: SUR449

## 2022-06-19 NOTE — Progress Notes (Signed)
     Laser Ablation Procedure    Date: 06/19/2022   Elijah [name] DOB:May 18, 1951  Consent signed: Yes      Surgeon: Gae Gallop MD   Procedure: Laser Ablation: left Greater Saphenous Vein  BP 121/79 (BP Location: Left Arm, Patient Position: Sitting, Cuff Size: Large)   Pulse 84   Temp 98.8 F (37.1 C) (Temporal)   Resp 18   Ht '6\' 3"'$  (1.905 m)   Wt 253 lb (114.8 kg)   SpO2 98%   BMI 31.62 kg/m   Tumescent Anesthesia: 800 cc 0.9% NaCl with 50 cc Lidocaine HCL 1%  and 15 cc 8.4% NaHCO3  Local Anesthesia: 18 cc Lidocaine HCL and NaHCO3 (ratio 2:1)  7 watts continuous mode     Total energy: 2361 Joules    Total time: 337 seconds Treatment Length  50 cm   Laser Fiber Ref. #  [id number]   Lot #  [id number]   Stab Phlebectomy: >20 Sites: Thigh and Calf  Patient tolerated procedure well  Notes:   All staff members wore facial masks.  Last dose of Xarelto taken by [name] on 06-16-2022.  [name] took Ativan 1 mg (1 tablet) on 06-19-2022 at 7:30 AM and at 8:20 AM.  Description of Procedure:  After marking the course of the secondary varicosities, the patient was placed on the operating table in the supine position, and the left leg was prepped and draped in sterile fashion.   Local anesthetic was administered and under ultrasound guidance the saphenous vein was accessed with a micro needle and guide wire; then the mirco puncture sheath was placed.  A guide wire was inserted saphenofemoral junction , followed by a 5 french sheath.  The position of the sheath and then the laser fiber below the junction was confirmed using the ultrasound.  Tumescent anesthesia was administered along the course of the saphenous vein using ultrasound guidance. The patient was placed in Trendelenburg position and protective laser glasses were placed on patient and staff, and the laser was fired at 7 watts continuous mode for a total of 2361  joules.   For stab phlebectomies, local anesthetic was  administered at the previously marked varicosities, and tumescent anesthesia was administered around the vessels.  Greater than 20 stab wounds were made using the tip of an 11 blade. And using the vein hook, the phlebectomies were performed using a hemostat to avulse the varicosities.  Adequate hemostasis was achieved.     Steri strips were applied to the stab wounds and ABD pads and thigh high compression stockings were applied.  Ace wrap bandages were applied over the phlebectomy sites and at the top of the saphenofemoral junction. Blood loss was less than 15 cc.  Discharge instructions reviewed with patient and hardcopy of discharge instructions given to patient to take home. The patient ambulated out of the operating room having tolerated the procedure well.

## 2022-06-19 NOTE — Progress Notes (Signed)
Patient name: Elijah [name] MRN: [id number] DOB: 1951-02-24 Sex: male  REASON FOR VISIT: For laser ablation of the left great saphenous vein and greater than 20 stabs.  HPI: Elijah [name] is a 72 y.o. male who I saw on 05/22/2022.  He has CEAP C4c venous disease.  He had significant bleeding from the left leg and is veins are under significant pressure.  I felt that he would be a good candidate for laser ablation of the left great saphenous vein with greater than 20 stabs to try to lower his venous pressure.  He would also require 2 units of sclerotherapy.  I felt that he might be a candidate for staged laser ablation of the small saphenous vein if he continues to have issues.  He is on Xarelto and he was instructed to stop this 2 days prior to this procedure.  Current Outpatient Medications  Medication Sig Dispense Refill   Cholecalciferol (VITAMIN D3) 50 MCG (2000 UT) TABS Take 50 mcg by mouth once.     levothyroxine (SYNTHROID) 137 MCG tablet Take 1 tablet (137 mcg total) by mouth daily before breakfast. 90 tablet 3   LORazepam (ATIVAN) 1 MG tablet Take 2 tablets 30 minutes prior to leaving house on day of office surgery.  Bring third tablet with you to office on day of office surgery. 3 tablet 0   Omega-3 Fatty Acids (FISH OIL PO) Take 2 capsules by mouth daily.     rosuvastatin (CRESTOR) 10 MG tablet Take 1 tablet (10 mg total) by mouth daily. 90 tablet 3   rivaroxaban (XARELTO) 20 MG TABS tablet Take 1 tablet (20 mg total) by mouth daily with supper. (Patient not taking: Reported on 06/19/2022) 90 tablet 3   No current facility-administered medications for this visit.    PHYSICAL EXAM: Vitals:   06/19/22 0831  BP: 121/79  Pulse: 84  Resp: 18  Temp: 98.8 F (37.1 C)  TempSrc: Temporal  SpO2: 98%  Weight: 253 lb (114.8 kg)  Height: '6\' 3"'$  (1.905 m)    PROCEDURE: Laser ablation of the left great saphenous vein with greater than 20 stabs.  TECHNIQUE: The patient was  brought to the exam room and the dilated varicose veins in the left leg were marked with the patient standing.  He was then placed supine.  I looked at the left great saphenous vein and I felt that I could cannulate this in the proximal calf.  Left leg was prepped and draped in usual sterile fashion.  Under ultrasound guidance, after the skin was anesthetized, I cannulated the left great saphenous vein in the proximal calf.  Micropuncture sheath was introduced over the wire.  I then advanced the J-wire to below the saphenofemoral junction.  A 65 cm sheath was advanced over the wire and the wire and dilator removed.  The laser fiber was positioned at the end of the sheath and the sheath retracted.  I position the laser fiber 2.5 cm distal to the saphenofemoral junction.  Tumescent anesthesia was administered circumferentially around the vein.  The patient was placed in Trendelenburg.  Laser ablation was performed of the left great saphenous vein from 2 and half centimeters distal to the saphenofemoral junction to the proximal calf.  50 J/cm was used at 56 W.  Attention was then turned to stab phlebectomies.  All the marked areas were anesthetized with tumescent anesthesia.  Approximately 25 small stab incisions were made into each incision the vein was grasped with a hook  brought above the skin and then bluntly excised.  Pressure was held for hemostasis.  Steri-Strips were applied.  A pressure dressing was applied.  The patient will return in 1 week for follow-up duplex.  Deitra Mayo [facility] [place] [phone]

## 2022-06-25 ENCOUNTER — Other Ambulatory Visit: Payer: Self-pay | Admitting: Internal Medicine

## 2022-06-26 ENCOUNTER — Encounter (HOSPITAL_COMMUNITY): Payer: Medicare PPO

## 2022-06-26 ENCOUNTER — Encounter: Payer: Medicare PPO | Admitting: Vascular Surgery

## 2022-06-27 ENCOUNTER — Ambulatory Visit (INDEPENDENT_AMBULATORY_CARE_PROVIDER_SITE_OTHER): Payer: Medicare PPO | Admitting: Vascular Surgery

## 2022-06-27 ENCOUNTER — Encounter: Payer: Self-pay | Admitting: Vascular Surgery

## 2022-06-27 ENCOUNTER — Ambulatory Visit (HOSPITAL_COMMUNITY)
Admission: RE | Admit: 2022-06-27 | Discharge: 2022-06-27 | Disposition: A | Discharge status: Home / Self Care | Payer: Medicare PPO | Source: Ambulatory Visit | Attending: Vascular Surgery | Admitting: Vascular Surgery

## 2022-06-27 VITALS — BP 143/89 | HR 71 | Temp 98.2°F | Resp 20 | Ht 75.0 in | Wt 259.0 lb

## 2022-06-27 DIAGNOSIS — I872 Venous insufficiency (chronic) (peripheral): Secondary | ICD-10-CM

## 2022-06-27 DIAGNOSIS — I83892 Varicose veins of left lower extremities with other complications: Secondary | ICD-10-CM | POA: Diagnosis not present

## 2022-06-27 NOTE — Progress Notes (Signed)
Patient name: Elijah [name] MRN: [id number] DOB: 08-Jul-1950 Sex: male  REASON FOR VISIT: Follow-up after laser ablation of the left great saphenous vein and greater than 20 stabs.  HPI: Elijah [name] is a 72 y.o. male with CEAP C4c venous disease.  He had a issue with significant bleeding from the left leg.  He was felt to be a good candidate for laser ablation of the left great saphenous vein with greater than 20 stabs to try to lower his venous pressure.  We also wanted to get 2 units of sclerotherapy to address the area where he bled.  I felt that if he continued to have issues he might be a candidate for laser ablation of the left small saphenous vein.  The patient states that his left leg feels much better.  He states that the bluish discoloration improved significantly.  He had no further bleeding episodes.   Current Outpatient Medications  Medication Sig Dispense Refill   Cholecalciferol (VITAMIN D3) 50 MCG (2000 UT) TABS Take 50 mcg by mouth once.     levothyroxine (SYNTHROID) 137 MCG tablet TAKE 1 TABLET(137 MCG) BY MOUTH DAILY BEFORE BREAKFAST 90 tablet 3   Omega-3 Fatty Acids (FISH OIL PO) Take 2 capsules by mouth daily.     rivaroxaban (XARELTO) 20 MG TABS tablet Take 1 tablet (20 mg total) by mouth daily with supper. 90 tablet 3   rosuvastatin (CRESTOR) 10 MG tablet Take 1 tablet (10 mg total) by mouth daily. 90 tablet 3   No current facility-administered medications for this visit.   REVIEW OF SYSTEMS: Valu.Nieves ] denotes positive finding; [  ] denotes negative finding  CARDIOVASCULAR:  '[ ]'$  chest pain   '[ ]'$  dyspnea on exertion  '[ ]'$  leg swelling  CONSTITUTIONAL:  '[ ]'$  fever   '[ ]'$  chills  PHYSICAL EXAM: There were no vitals filed for this visit. GENERAL: The patient is a well-nourished male, in no acute distress. The vital signs are documented above. CARDIOVASCULAR: There is a regular rate and rhythm. PULMONARY: There is good air exchange bilaterally without wheezing or  rales. VASCULAR: He has some mild bruising on the medial left thigh.  His stab incisions are healing well. The area that bled looks about the same.   I did look at the left small saphenous vein myself with the SonoSite.  The vein is fairly small until he gets near the saphenous popliteal junction where it is bigger.  There is a large tributaries here.  Currently I would hold off on laser ablation of the left small saphenous vein unless the vein enlarged further in the mid calf.  DATA:  VENOUS DUPLEX: I have independently interpreted his venous duplex scan today.  There is no evidence of DVT in the left lower extremity.  The left great saphenous vein is successfully closed from the proximal calf to within 1.8 cm of the saphenofemoral junction.  S/P LASER ABLATION LEFT GREAT SAPHENOUS VEIN/GREATER THAN 20 STABS:  The patient is doing well status post laser ablation of the left great saphenous vein with greater than 20 stabs.  The we will get him set up for sclerotherapy in the left lateral foot where he had the bleeding issue.  He does have some reflux in the small saphenous vein however currently I would not favor laser ablation unless the vein enlarged further distally.  I will see him back in 6 months he can take a look at this myself without a formal venous reflux  test.  He knows to call sooner if he has problems.  Deitra Mayo [facility] [place] [phone]

## 2022-07-19 ENCOUNTER — Ambulatory Visit: Payer: Medicare PPO

## 2022-07-29 ENCOUNTER — Ambulatory Visit: Payer: Medicare PPO

## 2022-07-29 DIAGNOSIS — I83893 Varicose veins of bilateral lower extremities with other complications: Secondary | ICD-10-CM | POA: Diagnosis not present

## 2022-07-29 NOTE — Progress Notes (Signed)
Treated pt's LLE spider and small reticular veins with Asclera 1% administered with a 27g butterfly.  Patient received a total of 3.5 mL. Pt had a bleeding vein in this area after a shower a couple months ago. This area was treated today. Pt tolerated well. He was given post treatment instructions on handout and verbal. Will call if he has any questions/concerns.   Photos: Yes.    Compression stockings applied: Yes.

## 2022-09-16 ENCOUNTER — Other Ambulatory Visit: Payer: Self-pay | Admitting: *Deleted

## 2022-09-16 DIAGNOSIS — I482 Chronic atrial fibrillation, unspecified: Secondary | ICD-10-CM

## 2022-09-16 MED ORDER — RIVAROXABAN 20 MG PO TABS: 20.0000 mg | ORAL_TABLET | Freq: Every day | ORAL | 1 refills | Status: DC

## 2022-09-16 NOTE — Telephone Encounter (Signed)
Xarelto 20mg  refill request received. Pt is 72 years old, weight-117.5kg, Crea-1.08 on 04/09/22, last seen by Dr. Lovena Le on 09/24/21, Diagnosis-Afib, CrCl-104.26 mL/min; Dose is appropriate based on dosing criteria. Will send in refill to requested pharmacy.

## 2022-10-23 ENCOUNTER — Encounter: Payer: Self-pay | Admitting: Internal Medicine

## 2022-10-27 NOTE — Patient Instructions (Addendum)
   A  Medications changes include :       A referral was ordered and someone will call you to schedule an appointment.     No follow-ups on file.

## 2022-10-27 NOTE — Progress Notes (Unsigned)
Subjective:    Patient ID: Elijah [name], male    DOB: Oct 16, 1950, 72 y.o.   MRN: [id number]     HPI Elijah [name] is here for follow up of his chronic medical problems.  Started tracking his BP at home  past 2 months intermittently -   in am 145 consistently --- today 154/90.  He has seen higher numbers over time.  No symptoms associated with slightly elevated blood pressure including headaches, lightheadedness, chest pain or palpitations Does not get leg swelling with his varicose veins.  Medications and allergies reviewed with patient and updated if appropriate.  Current Outpatient Medications on File Prior to Visit  Medication Sig Dispense Refill   Cholecalciferol (VITAMIN D3) 50 MCG (2000 UT) TABS Take 50 mcg by mouth once.     levothyroxine (SYNTHROID) 137 MCG tablet TAKE 1 TABLET(137 MCG) BY MOUTH DAILY BEFORE BREAKFAST 90 tablet 3   Omega-3 Fatty Acids (FISH OIL PO) Take 2 capsules by mouth daily.     rivaroxaban (XARELTO) 20 MG TABS tablet Take 1 tablet (20 mg total) by mouth daily with supper. 90 tablet 1   rosuvastatin (CRESTOR) 10 MG tablet Take 1 tablet (10 mg total) by mouth daily. 90 tablet 3   No current facility-administered medications on file prior to visit.     Review of Systems  Respiratory:  Negative for shortness of breath.   Cardiovascular:  Negative for chest pain, palpitations and leg swelling.  Neurological:  Negative for light-headedness and headaches.       Objective:   Vitals:   10/28/22 0933  BP: (!) 140/80  Pulse: 85  Temp: 98.5 F (36.9 C)  SpO2: 98%   BP Readings from Last 3 Encounters:  10/28/22 (!) 140/80  06/27/22 (!) 143/89  06/19/22 121/79   Wt Readings from Last 3 Encounters:  10/28/22 260 lb (117.9 kg)  06/27/22 259 lb (117.5 kg)  06/19/22 253 lb (114.8 kg)   Body mass index is 32.5 kg/m.    Physical Exam Constitutional:      General: He is not in acute distress.    Appearance: Normal appearance. He is not  ill-appearing.  HENT:     Head: Normocephalic and atraumatic.  Cardiovascular:     Rate and Rhythm: Normal rate and regular rhythm.     Heart sounds: Normal heart sounds.  Pulmonary:     Effort: Pulmonary effort is normal. No respiratory distress.     Breath sounds: Normal breath sounds. No wheezing or rales.  Musculoskeletal:     Right lower leg: No edema.     Left lower leg: No edema.  Skin:    General: Skin is warm and dry.     Findings: No rash.  Neurological:     Mental Status: He is alert.        Lab Results  Component Value Date   WBC 7.1 04/09/2022   HGB 16.3 04/09/2022   HCT 49.7 04/09/2022   PLT 172.0 04/09/2022   GLUCOSE 90 04/09/2022   CHOL 107 05/28/2022   TRIG 53.0 05/28/2022   HDL 40.30 05/28/2022   LDLCALC 56 05/28/2022   ALT 15 05/28/2022   AST 35 05/28/2022   NA 136 04/09/2022   K 4.3 04/09/2022   CL 102 04/09/2022   CREATININE 1.08 04/09/2022   BUN 21 04/09/2022   CO2 28 04/09/2022   TSH 1.59 04/09/2022   PSA 2.36 04/09/2022  Assessment & Plan:    See Problem List for Assessment and Plan of chronic medical problems.

## 2022-10-28 ENCOUNTER — Ambulatory Visit: Payer: Medicare PPO | Admitting: Internal Medicine

## 2022-10-28 ENCOUNTER — Encounter: Payer: Self-pay | Admitting: Internal Medicine

## 2022-10-28 VITALS — BP 130/72 | HR 85 | Temp 98.5°F | Ht 75.0 in | Wt 260.0 lb

## 2022-10-28 DIAGNOSIS — I10 Essential (primary) hypertension: Secondary | ICD-10-CM

## 2022-10-28 MED ORDER — TELMISARTAN 20 MG PO TABS: 20.0000 mg | ORAL_TABLET | Freq: Every day | ORAL | 1 refills | Status: DC

## 2022-10-28 NOTE — Assessment & Plan Note (Signed)
New Blood pressure has become higher over the years and is more consistently slightly elevated Think we need to initiate medication to maintain well-controlled blood pressure Start telmisartan 20 mg daily BMP in 1-2 weeks Continue to monitor BP at home

## 2022-11-06 ENCOUNTER — Encounter: Payer: Self-pay | Admitting: Internal Medicine

## 2022-11-08 ENCOUNTER — Other Ambulatory Visit (INDEPENDENT_AMBULATORY_CARE_PROVIDER_SITE_OTHER): Payer: Medicare PPO

## 2022-11-08 DIAGNOSIS — I10 Essential (primary) hypertension: Secondary | ICD-10-CM

## 2022-11-08 LAB — BASIC METABOLIC PANEL
BUN: 18 mg/dL (ref 6–23)
CO2: 26 mEq/L (ref 19–32)
Calcium: 9.1 mg/dL (ref 8.4–10.5)
Chloride: 107 mEq/L (ref 96–112)
Creatinine, Ser: 1.02 mg/dL (ref 0.40–1.50)
GFR: 73.99 mL/min (ref >60.00)
Glucose, Bld: 95 mg/dL (ref 70–99)
Potassium: 4.2 mEq/L (ref 3.5–5.1)
Sodium: 139 mEq/L (ref 135–145)

## 2022-11-25 ENCOUNTER — Telehealth: Payer: Self-pay | Admitting: *Deleted

## 2022-11-25 NOTE — Telephone Encounter (Signed)
Patient's wife called stating patient was experiencing worsening pain, swelling and discoloration of his left foot/leg. Scheduled appoint for 11/26/2022

## 2022-11-26 ENCOUNTER — Ambulatory Visit (HOSPITAL_COMMUNITY)
Admission: RE | Admit: 2022-11-26 | Discharge: 2022-11-26 | Disposition: A | Discharge status: Home / Self Care | Payer: Medicare PPO | Source: Ambulatory Visit | Attending: Vascular Surgery | Admitting: Vascular Surgery

## 2022-11-26 ENCOUNTER — Other Ambulatory Visit (HOSPITAL_COMMUNITY): Payer: Self-pay | Admitting: Physician Assistant

## 2022-11-26 ENCOUNTER — Ambulatory Visit: Payer: Medicare PPO | Admitting: Physician Assistant

## 2022-11-26 ENCOUNTER — Encounter: Payer: Self-pay | Admitting: Physician Assistant

## 2022-11-26 VITALS — BP 119/90 | HR 78 | Temp 97.7°F | Resp 18 | Ht 75.0 in | Wt 261.6 lb

## 2022-11-26 DIAGNOSIS — I872 Venous insufficiency (chronic) (peripheral): Secondary | ICD-10-CM

## 2022-11-26 DIAGNOSIS — I83892 Varicose veins of left lower extremities with other complications: Secondary | ICD-10-CM | POA: Diagnosis not present

## 2022-11-26 DIAGNOSIS — R609 Edema, unspecified: Secondary | ICD-10-CM | POA: Diagnosis not present

## 2022-11-26 NOTE — Progress Notes (Unsigned)
Office Note   History of Present Illness   Elijah [name] is a 72 y.o. (06-Dec-1950) male who presents as a triage visit.   Past Medical History:  Diagnosis Date   Adenomatous colon polyp    Arthritis    mild thumbs   Atrial fibrillation (HCC) 06/17/2010   Dr Lewayne Bunting seen as OP   Atrial fibrillation (HCC) 11/22-23/2013   presentation as nausea, dizziness & chest tightness   Diverticulosis 06/17/2005   Hyperlipidemia    past hx - no issues per pt   Thyroid disease    hypothyroidism    Past Surgical History:  Procedure Laterality Date   CHEST TUBE INSERTION  1982   Spontaneous Pneumothorax   COLONOSCOPY     COLONOSCOPY W/ POLYPECTOMY  2007   Adenomatous Polyp; [facility], Dr Juanda Chance   ENDOVENOUS ABLATION SAPHENOUS VEIN W/ LASER Left 06/19/2022   endovenous laser ablation left greater saphenous vein and stab phlebectomy > 20 incisions left leg by Cari Caraway MD   Holter monitor-48 hour  03/2011, 12/2010   Dr Sharrell Ku   TONSILLECTOMY     TRANSTHORACIC ECHOCARDIOGRAM  12/2010    Social History   Socioeconomic History   Marital status: Married    Spouse name: Not on file   Number of children: 2   Years of education: Not on file   Highest education level: Not on file  Occupational History   Not on file  Tobacco Use   Smoking status: Never   Smokeless tobacco: Never  Vaping Use   Vaping Use: Never used  Substance and Sexual Activity   Alcohol use: No   Drug use: No   Sexual activity: Never  Other Topics Concern   Not on file  Social History Narrative   Exercise: 5 days of aerobic, two days of weights   Social Determinants of Health   Financial Resource Strain: Low Risk  (04/03/2022)   Overall Financial Resource Strain (CARDIA)    Difficulty of Paying Living Expenses: Not hard at all  Food Insecurity: No Food Insecurity (04/03/2022)   Hunger Vital Sign    Worried About Running Out of Food in the Last Year: Never true    Ran Out of Food in the  Last Year: Never true  Transportation Needs: No Transportation Needs (04/03/2022)   PRAPARE - Administrator, Civil Service (Medical): No    Lack of Transportation (Non-Medical): No  Physical Activity: Sufficiently Active (04/03/2022)   Exercise Vital Sign    Days of Exercise per Week: 7 days    Minutes of Exercise per Session: 30 min  Stress: No Stress Concern Present (04/03/2022)   [employer] of Occupational Health - Occupational Stress Questionnaire    Feeling of Stress : Not at all  Social Connections: Socially Integrated (04/03/2022)   Social Connection and Isolation Panel [NHANES]    Frequency of Communication with Friends and Family: More than three times a week    Frequency of Social Gatherings with Friends and Family: More than three times a week    Attends Religious Services: More than 4 times per year    Active Member of [employer] or Organizations: Yes    Attends Banker Meetings: More than 4 times per year    Marital Status: Married  Catering manager Violence: Not At Risk (04/03/2022)   Humiliation, Afraid, Rape, and Kick questionnaire    Fear of Current or Ex-Partner: No    Emotionally Abused: No  Physically Abused: No    Sexually Abused: No    Family History  Problem Relation Age of Onset   Stroke Mother 32       cns aneurysm bleed X2   Breast cancer Mother    Valvular heart disease Mother        valve replacement   Hypertension Mother    Hypertension Father    Cerebral aneurysm Sister 52   Hypertension Sister    Diabetes Neg Hx    Colon cancer Neg Hx    Rectal cancer Neg Hx    Stomach cancer Neg Hx    Colon polyps Neg Hx    Esophageal cancer Neg Hx     Current Outpatient Medications  Medication Sig Dispense Refill   Cholecalciferol (VITAMIN D3) 50 MCG (2000 UT) TABS Take 50 mcg by mouth once.     levothyroxine (SYNTHROID) 137 MCG tablet TAKE 1 TABLET(137 MCG) BY MOUTH DAILY BEFORE BREAKFAST 90 tablet 3   Omega-3 Fatty  Acids (FISH OIL PO) Take 2 capsules by mouth daily.     rivaroxaban (XARELTO) 20 MG TABS tablet Take 1 tablet (20 mg total) by mouth daily with supper. 90 tablet 1   rosuvastatin (CRESTOR) 10 MG tablet Take 1 tablet (10 mg total) by mouth daily. 90 tablet 3   telmisartan (MICARDIS) 20 MG tablet Take 1 tablet (20 mg total) by mouth daily. 90 tablet 1   No current facility-administered medications for this visit.    No Known Allergies  REVIEW OF SYSTEMS (negative unless checked):   Cardiac:  []  Chest pain or chest pressure? []  Shortness of breath upon activity? []  Shortness of breath when lying flat? []  Irregular heart rhythm?  Vascular:  []  Pain in calf, thigh, or hip brought on by walking? []  Pain in feet at night that wakes you up from your sleep? []  Blood clot in your veins? [x]  Leg swelling?  Pulmonary:  []  Oxygen at home? []  Productive cough? []  Wheezing?  Neurologic:  []  Sudden weakness in arms or legs? []  Sudden numbness in arms or legs? []  Sudden onset of difficult speaking or slurred speech? []  Temporary loss of vision in one eye? []  Problems with dizziness?  Gastrointestinal:  []  Blood in stool? []  Vomited blood?  Genitourinary:  []  Burning when urinating? []  Blood in urine?  Psychiatric:  []  Major depression  Hematologic:  []  Bleeding problems? []  Problems with blood clotting?  Dermatologic:  []  Rashes or ulcers?  Constitutional:  []  Fever or chills?  Ear/Nose/Throat:  []  Change in hearing? []  Nose bleeds? []  Sore throat?  Musculoskeletal:  []  Back pain? []  Joint pain? []  Muscle pain?   Physical Examination     Vitals:   11/26/22 1100  BP: (!) 119/90  Pulse: 78  Resp: 18  Temp: 97.7 F (36.5 C)  TempSrc: Temporal  SpO2: 95%  Weight: 261 lb 9.6 oz (118.7 kg)  Height: 6\' 3"  (1.905 m)   Body mass index is 32.7 kg/m.  General:  WDWN in NAD; vital signs documented above Gait: Not observed HENT: WNL, normocephalic Pulmonary:  normal non-labored breathing  Cardiac: regular Abdomen: soft, NT, no masses Skin: without rashes Vascular Exam/Pulses: palpable pedal pulses Extremities: multiple reticular veins and varicose veins of the LLE. 1+ edema of the left ankle and foot. Stasis pigmentation of the LLE. No ulcerations Musculoskeletal: no muscle wasting or atrophy  Neurologic: A&O X 3;  No focal weakness or paresthesias are detected Psychiatric:  The pt has Normal affect.  Non-invasive Vascular Imaging   LLE DVT Study (11/26/2022)   Medical Decision Making   Elijah [name] is a 72 y.o. male who presents as a triage visit for worsening LLE swelling, pain, and discoloration  Based on the patient's history and examination, I recommend: ***. I discussed with the patient the use of her 20-30 mm thigh high compression stockings and need for 3 month trial of such. He can keep his follow up with Dr.Dickson next month to discuss further LLE venous treatment options   Ernestene Mention, PA-C [facility] Office: [phone]  11/26/2022, 11:20 AM  Clinic MD: Maryjean Ka GSV ablation in [place] in Feb Potentially

## 2022-12-04 ENCOUNTER — Telehealth: Payer: Self-pay | Admitting: *Deleted

## 2022-12-04 NOTE — Telephone Encounter (Signed)
Returning Mrs. Harbeson's telephone message.  Mrs. Kotas reports that [employer] (her husband) experienced a bleeding episode from a varicosity on his left ankle last night. Perez Mickels is s/p endovenous laser ablation left greater saphenous vein by Cari Caraway MD January, 2024 and had sclerotherapy of left ankle varicosities (that had bled prior to laser ablation) February, 2024 by Cherene Julian RN. Mrs. Marple states they were able to get the bleeding stopped last night but are very concerned about recurrent bleeding episodes.  Discussed and reviewed Brad Wahlberg's medical history and concerns with Dr. Edilia Bo MD.   VV FU appointment made with Dr. Edilia Bo on 12-11-2022 at 10:20 AM.  Mrs. Spirito aware of appointment date and time. Reviewed how to stop bleeding of varicosities if episode reoccurred.  Mrs. Talbot verbalized understanding.

## 2022-12-05 ENCOUNTER — Telehealth: Payer: Self-pay | Admitting: *Deleted

## 2022-12-05 NOTE — Telephone Encounter (Signed)
Mrs. Mcgrann Frantom Esker's wife and emergency contact) states Ryel experienced another left ankle varicosity bleed this morning after showering.  She states that the bleeding has now stopped.  Suggested that Mr. Brunson keep compressive dressing over the bleeding varicosity at all times and that he avoid showering or tub bathing until his appointment with Dr. Edilia Bo scheduled for 12-11-2022.  Advised keeping his left leg elevated when sitting and avoid tight clothing and shoes that might rub the affected area.  Reviewed with Mrs. Kanan how to stop varicosity bleeding if it should reoccur.  She verbalized understanding.

## 2022-12-11 ENCOUNTER — Ambulatory Visit: Payer: Medicare PPO | Admitting: Vascular Surgery

## 2022-12-11 ENCOUNTER — Encounter: Payer: Self-pay | Admitting: Vascular Surgery

## 2022-12-11 VITALS — BP 121/79 | HR 77 | Temp 97.6°F | Wt 263.0 lb

## 2022-12-11 DIAGNOSIS — I872 Venous insufficiency (chronic) (peripheral): Secondary | ICD-10-CM | POA: Diagnosis not present

## 2022-12-11 NOTE — Progress Notes (Unsigned)
REASON FOR VISIT:   Bleeding varicose veins left leg.  MEDICAL ISSUES:   CHRONIC VENOUS INSUFFICIENCY: This patient has CEAP C4c venous disease (corona phlebectatica) and has had bleeding from one particular area on the left leg.  He underwent laser ablation of the left great saphenous vein with greater than 20 stabs and also received for sclerotherapy.  I felt that if he had continued issues he would be a candidate for laser ablation of the left small saphenous vein.   He is now having no bleeding problems from the wound on the lateral aspect of his foot and the previous area of concern has now developed an eschar 2.  He had 3 bleeding episodes last week.  He continues to elevate his legs and wears compression stockings.  Given these recurrent bleeding episodes I have recommended that we proceed with laser ablation of the left small saphenous vein.  I looked carefully at the vein and the patient does have a vein of Desma Maxim and does have a connection to the popliteal vein.  He has multiple varicosities in the calf.  In addition I would like to try to excise the vein where he had the original bleed as this pain is under significant pressure and I do not think sclerotherapy was fully effective there.  The veins feeding the new area are smaller and I think they would benefit from 2 units of sclerotherapy.  We have discussed the indications for laser ablation and stab phlebectomies.  All of his questions were answered and he is agreeable to proceed.  HPI:   Elijah [name] is a pleasant 72 y.o. male who had significant bleeding from his left leg as his veins were under significant pressure.  If failed conservative treatment and on 06/19/2022 underwent laser ablation of the left great saphenous vein with greater than 20 stabs to help lower his venous pressure.  He did well after the procedure and his venous duplex scan showed no evidence of DVT.  The left great saphenous vein was successfully  closed from the proximal calf to within 1.8 cm of the saphenofemoral junction.  Since I saw him last, he has had 3 bleeding episodes from a varicosity on his left foot.  This wound is on the lateral aspect of the foot.  The previous area has not bled but just yesterday he developed an eschar in this area.  This is all documented in the photographs below.  He had a bleeding episode last Tuesday and then again 2 bleeding episodes on Thursday.  These were associated with significant blood loss.  He is on Xarelto for A-fib.  Past Medical History:  Diagnosis Date   Adenomatous colon polyp    Arthritis    mild thumbs   Atrial fibrillation (HCC) 06/17/2010   Dr Lewayne Bunting seen as OP   Atrial fibrillation (HCC) 11/22-23/2013   presentation as nausea, dizziness & chest tightness   Diverticulosis 06/17/2005   Hyperlipidemia    past hx - no issues per pt   Thyroid disease    hypothyroidism    Family History  Problem Relation Age of Onset   Stroke Mother 42       cns aneurysm bleed X2   Breast cancer Mother    Valvular heart disease Mother        valve replacement   Hypertension Mother    Hypertension Father    Cerebral aneurysm Sister 25   Hypertension Sister    Diabetes Neg Hx  Colon cancer Neg Hx    Rectal cancer Neg Hx    Stomach cancer Neg Hx    Colon polyps Neg Hx    Esophageal cancer Neg Hx     SOCIAL HISTORY: Social History   Tobacco Use   Smoking status: Never   Smokeless tobacco: Never  Substance Use Topics   Alcohol use: No    No Known Allergies  Current Outpatient Medications  Medication Sig Dispense Refill   Cholecalciferol (VITAMIN D3) 50 MCG (2000 UT) TABS Take 50 mcg by mouth once.     levothyroxine (SYNTHROID) 137 MCG tablet TAKE 1 TABLET(137 MCG) BY MOUTH DAILY BEFORE BREAKFAST 90 tablet 3   Omega-3 Fatty Acids (FISH OIL PO) Take 2 capsules by mouth daily.     rivaroxaban (XARELTO) 20 MG TABS tablet Take 1 tablet (20 mg total) by mouth daily with  supper. 90 tablet 1   rosuvastatin (CRESTOR) 10 MG tablet Take 1 tablet (10 mg total) by mouth daily. 90 tablet 3   telmisartan (MICARDIS) 20 MG tablet Take 1 tablet (20 mg total) by mouth daily. 90 tablet 1   No current facility-administered medications for this visit.    REVIEW OF SYSTEMS:  [X]  denotes positive finding, [ ]  denotes negative finding Cardiac  Comments:  Chest pain or chest pressure:    Shortness of breath upon exertion:    Short of breath when lying flat:    Irregular heart rhythm:        Vascular    Pain in calf, thigh, or hip brought on by ambulation:    Pain in feet at night that wakes you up from your sleep:     Blood clot in your veins:    Leg swelling:         Pulmonary    Oxygen at home:    Productive cough:     Wheezing:         Neurologic    Sudden weakness in arms or legs:     Sudden numbness in arms or legs:     Sudden onset of difficulty speaking or slurred speech:    Temporary loss of vision in one eye:     Problems with dizziness:         Gastrointestinal    Blood in stool:     Vomited blood:         Genitourinary    Burning when urinating:     Blood in urine:        Psychiatric    Major depression:         Hematologic    Bleeding problems:    Problems with blood clotting too easily:        Skin    Rashes or ulcers:        Constitutional    Fever or chills:     PHYSICAL EXAM:   Vitals:   12/11/22 1025  BP: 121/79  Pulse: 77  Temp: 97.6 F (36.4 C)  TempSrc: Temporal  SpO2: 98%  Weight: 263 lb (119.3 kg)    GENERAL: The patient is a well-nourished male, in no acute distress. The vital signs are documented above. CARDIAC: There is a regular rate and rhythm.  VASCULAR: I do not detect carotid bruits. He has corona phlebectatica with the 2 areas of concern as noted in the photographs below.     PULMONARY: There is good air exchange bilaterally without wheezing or rales. ABDOMEN: Soft and non-tender with normal  pitched  bowel sounds.  MUSCULOSKELETAL: There are no major deformities or cyanosis. NEUROLOGIC: No focal weakness or paresthesias are detected. SKIN: There are no ulcers or rashes noted. PSYCHIATRIC: The patient has a normal affect.  DATA:       VENOUS DUPLEX: He did have a DVT study on 11/26/2022 which showed no evidence of DVT in the left lower extremity.  Waverly Ferrari [facility] [phone]

## 2022-12-24 ENCOUNTER — Encounter: Payer: Self-pay | Admitting: Vascular Surgery

## 2022-12-24 ENCOUNTER — Other Ambulatory Visit: Payer: Self-pay | Admitting: *Deleted

## 2022-12-24 DIAGNOSIS — I83892 Varicose veins of left lower extremities with other complications: Secondary | ICD-10-CM

## 2022-12-24 MED ORDER — LORAZEPAM 1 MG PO TABS: ORAL_TABLET | ORAL | 0 refills | Status: DC

## 2023-01-01 ENCOUNTER — Other Ambulatory Visit: Payer: Medicare PPO | Admitting: Vascular Surgery

## 2023-01-01 ENCOUNTER — Encounter: Payer: Self-pay | Admitting: Vascular Surgery

## 2023-01-01 VITALS — BP 130/84 | HR 73 | Temp 97.9°F | Resp 18 | Ht 75.0 in | Wt 252.0 lb

## 2023-01-01 DIAGNOSIS — I83892 Varicose veins of left lower extremities with other complications: Secondary | ICD-10-CM | POA: Diagnosis not present

## 2023-01-01 DIAGNOSIS — I872 Venous insufficiency (chronic) (peripheral): Secondary | ICD-10-CM

## 2023-01-01 HISTORY — PX: ENDOVENOUS ABLATION SAPHENOUS VEIN W/ LASER: SUR449

## 2023-01-01 NOTE — Progress Notes (Signed)
     Laser Ablation Procedure    Date: 01/01/2023   Elijah [name] DOB:15-Apr-1951  Consent signed: Yes      Surgeon: Cari Caraway MD   Procedure: Laser Ablation: left Small Saphenous Vein  BP 130/84 (BP Location: Left Arm, Patient Position: Sitting, Cuff Size: Large)   Pulse 73   Temp 97.9 F (36.6 C) (Temporal)   Resp 18   Ht 6\' 3"  (1.905 m)   Wt 252 lb (114.3 kg)   SpO2 97%   BMI 31.50 kg/m   Tumescent Anesthesia: 325 cc 0.9% NaCl with 50 cc Lidocaine HCL 1%  and 15 cc 8.4% NaHCO3  Local Anesthesia: 3 cc Lidocaine HCL and NaHCO3 (ratio 2:1)  7 watts continuous mode     Total energy: 975 cm    Total time: 20.01 seconds  Treatment Length 19 cm   Laser Fiber Ref. # [id number]     Lot #  [id number]   Stab Phlebectomy: 10-20 Sites: Calf  Patient tolerated procedure well  Notes: All staff members wore facial masks. Elijah [name] took Ativan 1 mg (2 tablets) on 01-01-2023 at 9:35 AM.  Mr. Hankin last dose of Xarelto was on 12-29-2022.     Description of Procedure:  After marking the course of the secondary varicosities, the patient was placed on the operating table in the prone position, and the left leg was prepped and draped in sterile fashion.   Local anesthetic was administered and under ultrasound guidance the saphenous vein was accessed with a micro needle and guide wire; then the mirco puncture sheath was placed.  A guide wire was inserted saphenopopliteal junction , followed by a 5 french sheath.  The position of the sheath and then the laser fiber below the junction was confirmed using the ultrasound.  Tumescent anesthesia was administered along the course of the saphenous vein using ultrasound guidance. The patient was placed in Trendelenburg position and protective laser glasses were placed on patient and staff, and the laser was fired at 7 watts continuous mode for a total of 975 joules.   For stab phlebectomies, local anesthetic was administered at the  previously marked varicosities, and tumescent anesthesia was administered around the vessels.  Ten to 20 stab wounds were made using the tip of an 11 blade. And using the vein hook, the phlebectomies were performed using a hemostat to avulse the varicosities.  Adequate hemostasis was achieved.     Steri strips were applied to the stab wounds and ABD pads and thigh high compression stockings were applied.  Ace wrap bandages were applied over the phlebectomy sites and at the top of the saphenopopliteal junction. Blood loss was less than 15 cc.  Discharge instructions reviewed with patient and hardcopy of discharge instructions given to patient to take home. The patient ambulated out of the operating room having tolerated the procedure well.

## 2023-01-01 NOTE — Progress Notes (Signed)
Patient name: Elijah [name] MRN: [id number] DOB: [birth year] Sex: male  REASON FOR VISIT: For laser ablation of the left small saphenous vein  HPI: Elijah [name] is a 72 y.o. male who I saw on 12/11/2022 with CEAP C4c venous disease (corona phlebectatica).  He had bleeding issues from an area in the left leg.  He had undergone laser ablation of the left great saphenous vein with greater than 20 stabs and also received sclerotherapy.  He had recurrent bleeding episodes and therefore I felt we should address the reflux in his left small saphenous vein.  He had 3 bleeding episodes in the week before I saw him.  I looked at the left small saphenous vein myself and he does have a vein of Giacomini and does have a connection to the popliteal vein.  He has multiple varicosities in the calf.  Current Outpatient Medications  Medication Sig Dispense Refill   Cholecalciferol (VITAMIN D3) 50 MCG (2000 UT) TABS Take 50 mcg by mouth once.     levothyroxine (SYNTHROID) 137 MCG tablet TAKE 1 TABLET(137 MCG) BY MOUTH DAILY BEFORE BREAKFAST 90 tablet 3   LORazepam (ATIVAN) 1 MG tablet Take 2 tablets 30 minutes prior to leaving house on day of office surgery.  Bring third tablet with you to office on day of office surgery. 3 tablet 0   rosuvastatin (CRESTOR) 10 MG tablet Take 1 tablet (10 mg total) by mouth daily. 90 tablet 3   telmisartan (MICARDIS) 20 MG tablet Take 1 tablet (20 mg total) by mouth daily. 90 tablet 1   Omega-3 Fatty Acids (FISH OIL PO) Take 2 capsules by mouth daily. (Patient not taking: Reported on 01/01/2023)     rivaroxaban (XARELTO) 20 MG TABS tablet Take 1 tablet (20 mg total) by mouth daily with supper. (Patient not taking: Reported on 01/01/2023) 90 tablet 1   No current facility-administered medications for this visit.    PHYSICAL EXAM: Vitals:   01/01/23 1039  BP: 130/84  Pulse: 73  Resp: 18  Temp: 97.9 F (36.6 C)  TempSrc: Temporal  SpO2: 97%  Weight: 252 lb (114.3 kg)   Height: 6\' 3"  (1.905 m)    PROCEDURE: For laser ablation of the left small saphenous vein.  TECHNIQUE: The patient was taken to the exam room and the dilated veins in the posterior calf were marked with the patient standing.  The patient was then placed prone.  I looked at the small saphenous vein with the SonoSite and I felt that I could cannulate this in the mid calf.  The left leg was prepped and draped in usual sterile fashion.  Under ultrasound guidance, after the skin was anesthetized, I cannulated the left small saphenous vein in the mid calf with a micropuncture needle and a micropuncture sheath was introduced over wire.  I then advanced the J-wire into the small saphenous vein until it began to dive to join the deep vein.  Tumescent anesthesia was administered circumferentially around the vein.  The patient was placed in Trendelenburg.  Laser ablation was performed of the left small saphenous vein down to the mid calf.  50 J/cm was used at 7 W.  Attention was then turned to stab phlebectomies.  Approximately 15 small stab incisions were made over the marked areas after the areas were anesthetized with tumescent anesthesia.  Using approximately 15 small stab incisions the veins were "brought above the skin and then bluntly excised.  Pressure was applied.  Steri-Strips were applied.  A pressure dressing was applied.  He will return in 1 week for follow-up duplex.   Waverly Ferrari [facility] [phone]

## 2023-01-07 ENCOUNTER — Ambulatory Visit (INDEPENDENT_AMBULATORY_CARE_PROVIDER_SITE_OTHER): Payer: Medicare PPO

## 2023-01-07 ENCOUNTER — Other Ambulatory Visit: Payer: Self-pay | Admitting: Internal Medicine

## 2023-01-07 ENCOUNTER — Ambulatory Visit: Payer: Medicare PPO | Attending: Internal Medicine | Admitting: Internal Medicine

## 2023-01-07 ENCOUNTER — Telehealth: Payer: Self-pay | Admitting: *Deleted

## 2023-01-07 VITALS — BP 110/72 | HR 71 | Ht 75.0 in | Wt 254.0 lb

## 2023-01-07 DIAGNOSIS — R0683 Snoring: Secondary | ICD-10-CM

## 2023-01-07 DIAGNOSIS — I10 Essential (primary) hypertension: Secondary | ICD-10-CM

## 2023-01-07 DIAGNOSIS — I482 Chronic atrial fibrillation, unspecified: Secondary | ICD-10-CM

## 2023-01-07 NOTE — Progress Notes (Unsigned)
Enrolled for [employer] to mail a ZIO XT long term holter monitor to the patients address on file.  

## 2023-01-07 NOTE — Patient Instructions (Addendum)
Medication Instructions:  Your physician recommends that you continue on your current medications as directed. Please refer to the Current Medication list given to you today.  *If you need a refill on your cardiac medications before your next appointment, please call your pharmacy*  Lab Work: None ordered.  If you have labs (blood work) drawn today and your tests are completely normal, you will receive your results only by: MyChart Message (if you have MyChart) OR A paper copy in the mail If you have any lab test that is abnormal or we need to change your treatment, we will call you to review the results.  Testing/Procedures: Your physician has recommended that you have a sleep study. This test records several body functions during sleep, including: brain activity, eye movement, oxygen and carbon dioxide blood levels, heart rate and rhythm, breathing rate and rhythm, the flow of air through your mouth and nose, snoring, body muscle movements, and chest and belly movement.  Your physician has recommended that you wear a heart monitor. Heart monitors are medical devices that record the heart's electrical activity. Doctors most often use these monitors to diagnose arrhythmias. Arrhythmias are problems with the speed or rhythm of the heartbeat.   Follow-Up: At [facility], you and your health needs are our priority.  As part of our continuing mission to provide you with exceptional heart care, we have created designated Provider Care Teams.  These Care Teams include your primary Cardiologist (physician) and Advanced Practice Providers (APPs -  Physician Assistants and Nurse Practitioners) who all work together to provide you with the care you need, when you need it.  We recommend signing up for the patient portal called "MyChart".  Sign up information is provided on this After Visit Summary.  MyChart is used to connect with patients for Virtual Visits (Telemedicine).  Patients are able to view  lab/test results, encounter notes, upcoming appointments, etc.  Non-urgent messages can be sent to your provider as well.   To learn more about what you can do with MyChart, go to [email or web address].    Your next appointment:    Follow up based on results of heart monitor.  Important Information About Sugar      Your physician has recommended that you wear a Zio monitor.   This monitor is a medical device that records the heart's electrical activity. Doctors most often use these monitors to diagnose arrhythmias. Arrhythmias are problems with the speed or rhythm of the heartbeat. The monitor is a small device applied to your chest. You can wear one while you do your normal daily activities. While wearing this monitor if you have any symptoms to push the button and record what you felt. Once you have worn this monitor for the period of time provider prescribed (Usually 14 days), you will return the monitor device in the postage paid box. Once it is returned they will download the data collected and provide [place] with a report which the provider will then review and we will call you with those results. Important tips:  Avoid showering during the first 24 hours of wearing the monitor. Avoid excessive sweating to help maximize wear time. Do not submerge the device, no hot tubs, and no swimming pools. Keep any lotions or oils away from the patch. After 24 hours you may shower with the patch on. Take brief showers with your back facing the shower head.  Do not remove patch once it has been placed because that will interrupt data and decrease  adhesive wear time. Push the button when you have any symptoms and write down what you were feeling. Once you have completed wearing your monitor, remove and place into box which has postage paid and place in your outgoing mailbox.  If for some reason you have misplaced your box then call our office and we can provide another box and/or mail it off for  you.

## 2023-01-07 NOTE — Telephone Encounter (Signed)
Dr. Ladona Ridgel has ordered an Itamar study. Pt is agreeable to not open the box until he has been called with the PIN#.

## 2023-01-07 NOTE — Progress Notes (Signed)
HPI  No Known Allergies  Elijah [name] returns today for followup. He is a pleasant 36 man with a h/o chronic atrial fib, newly diagnosed HTN, and dyslipidemia . He has not had palpitations. He notes that his bp has been increased.  Current Outpatient Medications  Medication Sig Dispense Refill   Cholecalciferol (VITAMIN D3) 50 MCG (2000 UT) TABS Take 50 mcg by mouth once.     levothyroxine (SYNTHROID) 137 MCG tablet TAKE 1 TABLET(137 MCG) BY MOUTH DAILY BEFORE BREAKFAST 90 tablet 3   rivaroxaban (XARELTO) 20 MG TABS tablet Take 1 tablet (20 mg total) by mouth daily with supper. 90 tablet 1   rosuvastatin (CRESTOR) 10 MG tablet TAKE 1 TABLET(10 MG) BY MOUTH DAILY 90 tablet 3   telmisartan (MICARDIS) 20 MG tablet Take 1 tablet (20 mg total) by mouth daily. 90 tablet 1   No current facility-administered medications for this visit.     Past Medical History:  Diagnosis Date   Adenomatous colon polyp    Arthritis    mild thumbs   Atrial fibrillation (HCC) 06/17/2010   Dr Lewayne Bunting seen as OP   Atrial fibrillation (HCC) 11/22-23/2013   presentation as nausea, dizziness & chest tightness   Diverticulosis 06/17/2005   Hyperlipidemia    past hx - no issues per pt   Thyroid disease    hypothyroidism    ROS:   All systems reviewed and negative except as noted in the HPI.   Past Surgical History:  Procedure Laterality Date   CHEST TUBE INSERTION  1982   Spontaneous Pneumothorax   COLONOSCOPY     COLONOSCOPY W/ POLYPECTOMY  2007   Adenomatous Polyp; [facility], Dr Juanda Chance   ENDOVENOUS ABLATION SAPHENOUS VEIN W/ LASER Left 06/19/2022   endovenous laser ablation left greater saphenous vein and stab phlebectomy > 20 incisions left leg by Cari Caraway MD   ENDOVENOUS ABLATION SAPHENOUS VEIN W/ LASER Left 01/01/2023   endovenous laser ablation left small saphenous vein and stab phlebectomy 10-20 incisions left leg by Cari Caraway MD   Holter monitor-48 hour  03/2011, 12/2010    Dr Sharrell Ku   TONSILLECTOMY     TRANSTHORACIC ECHOCARDIOGRAM  12/2010     Family History  Problem Relation Age of Onset   Stroke Mother 29       cns aneurysm bleed X2   Breast cancer Mother    Valvular heart disease Mother        valve replacement   Hypertension Mother    Hypertension Father    Cerebral aneurysm Sister 32   Hypertension Sister    Diabetes Neg Hx    Colon cancer Neg Hx    Rectal cancer Neg Hx    Stomach cancer Neg Hx    Colon polyps Neg Hx    Esophageal cancer Neg Hx      Social History   Socioeconomic History   Marital status: Married    Spouse name: Not on file   Number of children: 2   Years of education: Not on file   Highest education level: Not on file  Occupational History   Not on file  Tobacco Use   Smoking status: Never   Smokeless tobacco: Never  Vaping Use   Vaping status: Never Used  Substance and Sexual Activity   Alcohol use: No   Drug use: No   Sexual activity: Never  Other Topics Concern   Not on file  Social History Narrative  Exercise: 5 days of aerobic, two days of weights   Social Determinants of Health   Financial Resource Strain: Low Risk  (04/03/2022)   Overall Financial Resource Strain (CARDIA)    Difficulty of Paying Living Expenses: Not hard at all  Food Insecurity: No Food Insecurity (04/03/2022)   Hunger Vital Sign    Worried About Running Out of Food in the Last Year: Never true    Ran Out of Food in the Last Year: Never true  Transportation Needs: No Transportation Needs (04/03/2022)   PRAPARE - Administrator, Civil Service (Medical): No    Lack of Transportation (Non-Medical): No  Physical Activity: Sufficiently Active (04/03/2022)   Exercise Vital Sign    Days of Exercise per Week: 7 days    Minutes of Exercise per Session: 30 min  Stress: No Stress Concern Present (04/03/2022)   [employer] of Occupational Health - Occupational Stress Questionnaire    Feeling of Stress : Not  at all  Social Connections: Socially Integrated (04/03/2022)   Social Connection and Isolation Panel [NHANES]    Frequency of Communication with Friends and Family: More than three times a week    Frequency of Social Gatherings with Friends and Family: More than three times a week    Attends Religious Services: More than 4 times per year    Active Member of [employer] or Organizations: Yes    Attends Engineer, structural: More than 4 times per year    Marital Status: Married  Catering manager Violence: Not At Risk (04/03/2022)   Humiliation, Afraid, Rape, and Kick questionnaire    Fear of Current or Ex-Partner: No    Emotionally Abused: No    Physically Abused: No    Sexually Abused: No     BP 110/72   Pulse 71   Ht 6\' 3"  (1.905 m)   Wt 254 lb (115.2 kg)   SpO2 98%   BMI 31.75 kg/m   Physical Exam:  Well appearing NAD HEENT: Unremarkable Neck:  No JVD, no thyromegally Lymphatics:  No adenopathy Back:  No CVA tenderness Lungs:  Clear with no wheezes HEART:  IRegular rate rhythm, no murmurs, no rubs, no clicks Abd:  soft, positive bowel sounds, no organomegally, no rebound, no guarding Ext:  2 plus pulses, no edema, no cyanosis, no clubbing Skin:  No rashes no nodules Neuro:  CN II through XII intact, motor grossly intact  EKG - atrial fib with a controlled VR  Assess/Plan:  Atrial fib - his VR is well controlled. No change in rate control. He will continue xarelto. HTN - his bp has been running better on micardis. He will work on weight and exercise. Venous insufficiency - he had a sore leg and some swollen varicose veins and he will continue his support stockings. He has undergone venous ablation by Dr. Durwin Nora and appears to be doing well. Obesity - he is encouraged to lose weight and avoid sugar sweetened drinks.   Sharlot Gowda Sherrie Marsan,MD

## 2023-01-08 ENCOUNTER — Ambulatory Visit (INDEPENDENT_AMBULATORY_CARE_PROVIDER_SITE_OTHER): Payer: Medicare PPO | Admitting: Vascular Surgery

## 2023-01-08 ENCOUNTER — Encounter: Payer: Self-pay | Admitting: Vascular Surgery

## 2023-01-08 ENCOUNTER — Ambulatory Visit (HOSPITAL_COMMUNITY)
Admission: RE | Admit: 2023-01-08 | Discharge: 2023-01-08 | Disposition: A | Discharge status: Home / Self Care | Payer: Medicare PPO | Source: Ambulatory Visit | Attending: Vascular Surgery | Admitting: Vascular Surgery

## 2023-01-08 VITALS — BP 111/76 | HR 78 | Temp 97.3°F | Resp 18 | Ht 75.0 in | Wt 252.9 lb

## 2023-01-08 DIAGNOSIS — I83892 Varicose veins of left lower extremities with other complications: Secondary | ICD-10-CM | POA: Insufficient documentation

## 2023-01-08 DIAGNOSIS — I872 Venous insufficiency (chronic) (peripheral): Secondary | ICD-10-CM

## 2023-01-08 NOTE — Progress Notes (Signed)
   Patient name: Elijah [name] MRN: [id number] DOB: 06/03/1951 Sex: male  REASON FOR VISIT: Follow-up after laser ablation of the left small saphenous vein and stab phlebectomies.  HPI: Elijah [name] is a 72 y.o. male with C4 venous disease.  He has had bleeding issues in an area of the left leg.  He has undergone laser ablation of the left great saphenous vein with greater than 20 stab phlebectomies in addition to sclerotherapy.  He had a recurrent bleeding episode and therefore I felt that we needed to address the reflux in the small saphenous vein also.  On 01/01/2023 he underwent laser ablation of the left small saphenous vein and stab phlebectomy.  Since I saw him last, he states his leg feels better.  He has had no problems with leg swelling.  He has been wearing his thigh-high compression stocking.  Current Outpatient Medications  Medication Sig Dispense Refill   Cholecalciferol (VITAMIN D3) 50 MCG (2000 UT) TABS Take 50 mcg by mouth once.     levothyroxine (SYNTHROID) 137 MCG tablet TAKE 1 TABLET(137 MCG) BY MOUTH DAILY BEFORE BREAKFAST 90 tablet 3   rivaroxaban (XARELTO) 20 MG TABS tablet Take 1 tablet (20 mg total) by mouth daily with supper. 90 tablet 1   rosuvastatin (CRESTOR) 10 MG tablet TAKE 1 TABLET(10 MG) BY MOUTH DAILY 90 tablet 3   telmisartan (MICARDIS) 20 MG tablet Take 1 tablet (20 mg total) by mouth daily. 90 tablet 1   No current facility-administered medications for this visit.   REVIEW OF SYSTEMS: Arly.[email or web address] ] denotes positive finding; [  ] denotes negative finding  CARDIOVASCULAR:  [ ]  chest pain   [ ]  dyspnea on exertion  [ ]  leg swelling  CONSTITUTIONAL:  [ ]  fever   [ ]  chills  PHYSICAL EXAM: Vitals:   01/08/23 1014  BP: 111/76  Pulse: 78  Resp: 18  Temp: (!) 97.3 F (36.3 C)  TempSrc: Temporal  SpO2: 97%  Weight: 252 lb 14.4 oz (114.7 kg)  Height: 6\' 3"  (1.905 m)   GENERAL: The patient is a well-nourished male, in no acute distress. The vital signs  are documented above. CARDIOVASCULAR: There is a regular rate and rhythm. PULMONARY: There is good air exchange bilaterally without wheezing or rales. VASCULAR: All of his incisions are healing nicely.  DATA:  VENOUS DUPLEX: I have independently interpreted his venous duplex scan today.  There is no evidence of DVT.  The left small saphenous vein is successfully closed to within 2.7 cm of the popliteal vein.  MEDICAL ISSUES:  CHRONIC VENOUS INSUFFICIENCY: This patient has now undergone laser ablation of the left great saphenous vein, left small saphenous vein, and multiple stab phlebectomies in order to lower his risk of future bleeding from the varicosities in his left lateral foot.  Overall he is done well.  If his varicose veins on the right progress he will let us know and we can do formal venous reflux testing on the right.  He will continue to wear his compression stocking for another week.  We have also discussed the importance of exercise and leg elevation.  Waverly Ferrari [facility] [phone]

## 2023-01-09 ENCOUNTER — Ambulatory Visit: Payer: Medicare PPO | Admitting: Vascular Surgery

## 2023-01-19 ENCOUNTER — Encounter: Payer: Self-pay | Admitting: Internal Medicine

## 2023-01-24 ENCOUNTER — Encounter: Payer: Self-pay | Admitting: Internal Medicine

## 2023-01-24 DIAGNOSIS — I482 Chronic atrial fibrillation, unspecified: Secondary | ICD-10-CM

## 2023-01-27 ENCOUNTER — Telehealth: Payer: Self-pay | Admitting: Internal Medicine

## 2023-01-27 NOTE — Telephone Encounter (Signed)
   Pre-operative Risk Assessment    Patient Name: ZAAVAN SIPP  DOB: [birth year] MRN: [id number]      Request for Surgical Clearance    Procedure:   Injections   Date of Surgery:  Clearance 02/04/23                                 Surgeon:    Surgeon's Group or Practice Name:  [facility] Phone number:  [phone] Fax number:  [phone]   Type of Clearance Requested:   - Pharmacy:  Hold Rivaroxaban (Xarelto) 2 days hold    Type of Anesthesia:  None    Additional requests/questions:      Melissa Noon   01/27/2023, 2:41 PM

## 2023-01-29 NOTE — Telephone Encounter (Signed)
Spoke with [facility] office and patient is scheduled to have Sclerotherapy.

## 2023-01-30 DIAGNOSIS — I482 Chronic atrial fibrillation, unspecified: Secondary | ICD-10-CM | POA: Diagnosis not present

## 2023-01-30 NOTE — Telephone Encounter (Signed)
Patient with diagnosis of afib on Xarelto for anticoagulation.    Procedure: sclerotherapy Date of procedure: 02/04/23   CHA2DS2-VASc Score = 3   This indicates a 3.2% annual risk of stroke. The patient's score is based upon: CHF History: 0 HTN History: 1 Diabetes History: 0 Stroke History: 0 Vascular Disease History: 1 Age Score: 1 Gender Score: 0      CrCl 90.7 ml/min  Per office protocol, patient can hold Xarelto for 2 days prior to procedure.    **This guidance is not considered finalized until pre-operative APP has relayed final recommendations.**

## 2023-01-30 NOTE — Telephone Encounter (Signed)
     Primary Cardiologist: Dr. Ladona Ridgel  Chart reviewed as part of pre-operative protocol coverage. Given past medical history and time since last visit, based on ACC/AHA guidelines, LAVORIS RICKETSON would be at acceptable risk for the planned procedure without further cardiovascular testing.   Patient with diagnosis of afib on Xarelto for anticoagulation.     Procedure: sclerotherapy Date of procedure: 02/04/23     CHA2DS2-VASc Score = 3   This indicates a 3.2% annual risk of stroke. The patient's score is based upon: CHF History: 0 HTN History: 1 Diabetes History: 0 Stroke History: 0 Vascular Disease History: 1 Age Score: 1 Gender Score: 0       CrCl 90.7 ml/min   Per office protocol, patient can hold Xarelto for 2 days prior to procedure.      I will route this recommendation to the requesting party via Epic fax function and remove from pre-op pool.  Please call with questions.  Thomasene Ripple. Redford Behrle NP-C     01/30/2023, 8:21 AM [facility] [address] Office [phone] Fax [phone]

## 2023-01-31 ENCOUNTER — Encounter: Payer: Self-pay | Admitting: Internal Medicine

## 2023-02-04 ENCOUNTER — Ambulatory Visit: Payer: Medicare PPO

## 2023-02-04 DIAGNOSIS — I83892 Varicose veins of left lower extremities with other complications: Secondary | ICD-10-CM | POA: Diagnosis not present

## 2023-02-04 DIAGNOSIS — I83899 Varicose veins of unspecified lower extremities with other complications: Secondary | ICD-10-CM

## 2023-02-04 NOTE — Progress Notes (Signed)
Treated pt LLE small reticular and spider veins that have previously bled, with Asclera 1% administered with a 27g butterfly.  Patient received a total of 2 mL. Pt tolerated very well. I had done the RLE bleeding area previously and it healed well and has not bled since treating. Hoping this area has the same outcome after one treatment. He was given post treatment care instructions on handout and verbally. He will f/u as needed.   Photos: Yes.    Compression stockings applied: Yes.

## 2023-02-06 ENCOUNTER — Encounter: Payer: Self-pay | Admitting: Internal Medicine

## 2023-02-06 NOTE — Progress Notes (Signed)
    Subjective:    Patient ID: Elijah [name], male    DOB: 1951/04/05, 72 y.o.   MRN: [id number]      HPI Cylis is here for  Chief Complaint  Patient presents with   Foot Injury    Left heel spot that is possibly infected     Left heel wound, painful not healing - it started about 2 months ago - denies obvious injury or stepping on anything.  The mid heel is hard and tender to touch.  No redness.  Not getting better.     Medications and allergies reviewed with patient and updated if appropriate.  Current Outpatient Medications on File Prior to Visit  Medication Sig Dispense Refill   Cholecalciferol (VITAMIN D3) 50 MCG (2000 UT) TABS Take 50 mcg by mouth once.     levothyroxine (SYNTHROID) 137 MCG tablet TAKE 1 TABLET(137 MCG) BY MOUTH DAILY BEFORE BREAKFAST 90 tablet 3   rivaroxaban (XARELTO) 20 MG TABS tablet Take 1 tablet (20 mg total) by mouth daily with supper. 90 tablet 1   rosuvastatin (CRESTOR) 10 MG tablet TAKE 1 TABLET(10 MG) BY MOUTH DAILY 90 tablet 3   telmisartan (MICARDIS) 20 MG tablet Take 1 tablet (20 mg total) by mouth daily. 90 tablet 1   No current facility-administered medications on file prior to visit.    Review of Systems  Constitutional:  Negative for fever.  Skin:  Positive for color change (thickness of heel skin) and wound. Negative for rash.       Objective:   Vitals:   02/07/23 0905  BP: 120/84  Pulse: 97  Temp: 98.2 F (36.8 C)  SpO2: 96%   BP Readings from Last 3 Encounters:  02/07/23 120/84  01/08/23 111/76  01/07/23 110/72   Wt Readings from Last 3 Encounters:  02/07/23 252 lb (114.3 kg)  01/08/23 252 lb 14.4 oz (114.7 kg)  01/07/23 254 lb (115.2 kg)   Body mass index is 31.5 kg/m.    Physical Exam Constitutional:      General: He is not in acute distress.    Appearance: Normal appearance. He is not ill-appearing.  HENT:     Head: Normocephalic and atraumatic.  Skin:    General: Skin is warm and dry.      Comments: Mid left heel - area of scar tissue/nodule with mild surrounding induration - area tender.  No redness. No open wound  Neurological:     Mental Status: He is alert.            Assessment & Plan:    See Problem List for Assessment and Plan of chronic medical problems.

## 2023-02-07 ENCOUNTER — Ambulatory Visit: Payer: Medicare PPO | Admitting: Internal Medicine

## 2023-02-07 VITALS — BP 120/84 | HR 97 | Temp 98.2°F | Ht 75.0 in | Wt 252.0 lb

## 2023-02-07 DIAGNOSIS — M79672 Pain in left foot: Secondary | ICD-10-CM | POA: Diagnosis not present

## 2023-02-07 DIAGNOSIS — I10 Essential (primary) hypertension: Secondary | ICD-10-CM | POA: Diagnosis not present

## 2023-02-07 DIAGNOSIS — G8929 Other chronic pain: Secondary | ICD-10-CM | POA: Diagnosis not present

## 2023-02-07 NOTE — Patient Instructions (Signed)
     A referral was ordered podiatry and someone will call you to schedule an appointment.

## 2023-02-07 NOTE — Assessment & Plan Note (Signed)
New Blood pressure well controlled Continue telmisartan 20 mg daily Continue to monitor BP at home

## 2023-02-07 NOTE — Assessment & Plan Note (Signed)
Subacute Ongoing x 2 months No obvious injury Probable foreign body with surrounding scar tissue No evidence of infection Referral to podiatry

## 2023-02-07 NOTE — Telephone Encounter (Signed)
Prior Authorization for ITAMAR sent to [employer] via web portal. Tracking Number . NO PA REQ 

## 2023-02-12 ENCOUNTER — Encounter (INDEPENDENT_AMBULATORY_CARE_PROVIDER_SITE_OTHER): Payer: Medicare PPO | Admitting: Cardiology

## 2023-02-12 DIAGNOSIS — I4819 Other persistent atrial fibrillation: Secondary | ICD-10-CM | POA: Diagnosis not present

## 2023-02-12 DIAGNOSIS — R0683 Snoring: Secondary | ICD-10-CM | POA: Diagnosis not present

## 2023-02-12 NOTE — Telephone Encounter (Signed)
Called the patient and left voicemail with the PIN #: "[id number]" so he can proceed with his [place]-HST.  Stressed importance of completing within the next 24-48 hours and gave office number to call with any questions.

## 2023-02-13 ENCOUNTER — Ambulatory Visit: Payer: Medicare PPO | Attending: Internal Medicine

## 2023-02-13 DIAGNOSIS — R0683 Snoring: Secondary | ICD-10-CM

## 2023-02-13 NOTE — Procedures (Signed)
   SLEEP STUDY REPORT Patient Information Study Date: 02/12/2023 Patient Name: Elijah [name] Patient ID: [id number] Birth Date: 1950-12-02 Age: 72 Gender: Male BMI: 31.9 (W=253 lb, H=6' 3'') Stopbang: 7 Referring Physician: Lewayne Bunting, MD  TEST DESCRIPTION: Home sleep apnea testing was completed using the WatchPat, a Type 1 device, utilizing  peripheral arterial tonometry (PAT), chest movement, actigraphy, pulse oximetry, pulse rate, body position and snore.  AHI was calculated with apnea and hypopnea using valid sleep time as the denominator. RDI includes apneas,  hypopneas, and RERAs. The data acquired and the scoring of sleep and all associated events were performed in  accordance with the recommended standards and specifications as outlined in the AASM Manual for the Scoring of  Sleep and Associated Events 2.2.0 (2015).   FINDINGS: 1. No evidence of Obstructive Sleep Apnea with AHI 1.6/hr.  2. No Central Sleep Apnea. 3. Oxygen desaturations as low as 88%. 4. Mild snoring was present. O2 sats were < 88% for 0 minutes. 5. Total sleep time was 4 hrs and 54 min. 6. 16.6% of total sleep time was spent in REM sleep.  7. Shortened sleep onset latency at 6 min.  8. Shortened REM sleep onset latency at 72 min.  9. Total awakenings were 7.  10. Arrhythmia detection: Possible atrial fibrillation lasting 5 hours 17 min and 25 seconds. This is not diagnostic and  recommend further workup if clinically indicated.  DIAGNOSIS:  Normal study with no significant sleep disordered breathing. Possible Atrial Fibrillation  RECOMMENDATIONS: 1. Normal study with no significant sleep disordered breathing.  2. Healthy sleep recommendations include: adequate nightly sleep (normal 7-9 hrs/night), avoidance of caffeine after  noon and alcohol near bedtime, and maintaining a sleep environment that is cool, dark and quiet.  3. Weight loss for overweight patients is recommended.   4. Snoring  recommendations include: weight loss where appropriate, side sleeping, and avoidance of alcohol before  bed.  5. Operation of motor vehicle or dangerous equipment must be avoided when feeling drowsy, excessively sleepy, or  mentally fatigued.   6. An ENT consultation which may be useful for specific causes of and possible treatment of bothersome snoring .   7. Weight loss may be of benefit in reducing the severity of snoring.   Signature: Armanda Magic, MD; [facility]; Diplomat, American Board of Sleep  Medicine Electronically Signed: 02/13/2023 8:45:37 AM

## 2023-02-14 ENCOUNTER — Encounter: Payer: Self-pay | Admitting: Internal Medicine

## 2023-02-14 MED ORDER — ROSUVASTATIN CALCIUM 10 MG PO TABS: ORAL_TABLET | ORAL | 3 refills | Status: DC

## 2023-02-18 ENCOUNTER — Encounter: Payer: Self-pay | Admitting: Internal Medicine

## 2023-02-20 ENCOUNTER — Ambulatory Visit: Payer: Medicare PPO | Admitting: Podiatry

## 2023-02-20 ENCOUNTER — Ambulatory Visit (INDEPENDENT_AMBULATORY_CARE_PROVIDER_SITE_OTHER): Payer: Medicare PPO

## 2023-02-20 ENCOUNTER — Encounter: Payer: Self-pay | Admitting: Podiatry

## 2023-02-20 DIAGNOSIS — M722 Plantar fascial fibromatosis: Secondary | ICD-10-CM

## 2023-02-20 DIAGNOSIS — G8929 Other chronic pain: Secondary | ICD-10-CM | POA: Diagnosis not present

## 2023-02-20 DIAGNOSIS — M79672 Pain in left foot: Secondary | ICD-10-CM | POA: Diagnosis not present

## 2023-02-20 DIAGNOSIS — L923 Foreign body granuloma of the skin and subcutaneous tissue: Secondary | ICD-10-CM | POA: Diagnosis not present

## 2023-02-20 MED ORDER — TRIAMCINOLONE ACETONIDE 10 MG/ML IJ SUSP
10.0000 mg | Freq: Once | INTRAMUSCULAR | Status: AC
  Administered 2023-02-20: 10 mg via INTRA_ARTICULAR

## 2023-02-20 NOTE — Progress Notes (Signed)
Subjective:   Patient ID: Elijah [name], male   DOB: 72 y.o.   MRN: [id number]   HPI Patient presents with a lot of pain in the left plantar heel and also thinks there may be something in his heel as there is a spot that also hurts separately.  Patient does not smoke likes to be active   Review of Systems  All other systems reviewed and are negative.       Objective:  Physical Exam Vitals and nursing note reviewed.  Constitutional:      Appearance: He is well-developed.  Pulmonary:     Effort: Pulmonary effort is normal.  Musculoskeletal:        General: Normal range of motion.  Skin:    General: Skin is warm.  Neurological:     Mental Status: He is alert.     Neurovascular status intact muscle strength adequate range of motion within normal limits with patient found to have inflammation fluid buildup plantar aspect left heel more general in its distribution and has a specific area of irritation plantar aspect of the left heel that in itself is very tender.  Good digital perfusion well-oriented     Assessment:  Acute plantar fasciitis left along with the possibility of unknown foreign body pathology left patient     Plan:  PE x-rays taken reviewed and I went ahead today I did sterile prep injected the plantar fascia left 3 mg dexamethasone Kenalog 5 mg Xylocaine I then did a sterile probe of the area after anesthesia obtained and I was able to remove the shard of glass which was part of his pathology flushed the area applied sterile dressing instructed on soaks and will reappoint as symptoms indicate  X-rays indicate no signs of bony pathology no indications foreign body stress fracture

## 2023-02-27 ENCOUNTER — Telehealth: Payer: Self-pay | Admitting: *Deleted

## 2023-02-27 NOTE — Telephone Encounter (Signed)
The patient has been notified of the result and verbalized understanding.  All questions (if any) were answered. Latrelle Dodrill, CMA 02/27/2023 5:29 PM     Pt is aware and agreeable to normal results.

## 2023-02-27 NOTE — Telephone Encounter (Signed)
-----   Message from Armanda Magic sent at 02/13/2023  8:47 AM EDT ----- Please let patient know that sleep study showed no significant sleep apnea.

## 2023-03-08 ENCOUNTER — Other Ambulatory Visit: Payer: Self-pay | Admitting: Internal Medicine

## 2023-03-08 DIAGNOSIS — I482 Chronic atrial fibrillation, unspecified: Secondary | ICD-10-CM

## 2023-03-10 NOTE — Telephone Encounter (Signed)
Prescription refill request for Xarelto received.  Indication:afib Last office visit:7/24 Weight:114.3  kg Age:72 Scr:1.02  5/24 CrCl:107.39  ml/min  Prescription refilled

## 2023-03-26 DIAGNOSIS — H5213 Myopia, bilateral: Secondary | ICD-10-CM | POA: Diagnosis not present

## 2023-03-26 DIAGNOSIS — H2513 Age-related nuclear cataract, bilateral: Secondary | ICD-10-CM | POA: Diagnosis not present

## 2023-04-21 ENCOUNTER — Other Ambulatory Visit: Payer: Self-pay | Admitting: Internal Medicine

## 2023-04-29 ENCOUNTER — Ambulatory Visit (INDEPENDENT_AMBULATORY_CARE_PROVIDER_SITE_OTHER): Payer: Medicare PPO

## 2023-04-29 VITALS — Ht 75.0 in | Wt 252.0 lb

## 2023-04-29 DIAGNOSIS — Z0000 Encounter for general adult medical examination without abnormal findings: Secondary | ICD-10-CM

## 2023-04-29 NOTE — Patient Instructions (Signed)
Elijah [name] , Thank you for taking time to come for your Medicare Wellness Visit. I appreciate your ongoing commitment to your health goals. Please review the following plan we discussed and let me know if I can assist you in the future.   Referrals/Orders/Follow-Ups/Clinician Recommendations: It was nice to talk with you today.  Keep up the good work.  This is a list of the screening recommended for you and due dates:  Health Maintenance  Topic Date Due   COVID-19 Vaccine (5 - 2023-24 season) 02/16/2023   Zoster (Shingles) Vaccine (2 of 2) 05/29/2023   Medicare Annual Wellness Visit  04/28/2024   DTaP/Tdap/Td vaccine (3 - Td or Tdap) 12/24/2025   Colon Cancer Screening  08/28/2026   Pneumonia Vaccine  Completed   Flu Shot  Completed   Hepatitis C Screening  Completed   HPV Vaccine  Aged Out    Advanced directives: (Copy Requested) Please bring a copy of your health care power of attorney and living will to the office to be added to your chart at your convenience.  Next Medicare Annual Wellness Visit scheduled for next year: Yes

## 2023-04-29 NOTE — Progress Notes (Signed)
Subjective:   Elijah [name] is a 72 y.o. male who presents for Medicare Annual/Subsequent preventive examination.  Visit Complete: Virtual I connected with  Elijah [name] on 04/29/23 by a audio enabled telemedicine application and verified that I am speaking with the correct person using two identifiers.  Patient Location: Home  Provider Location: Home Office  I discussed the limitations of evaluation and management by telemedicine. The patient expressed understanding and agreed to proceed.  Vital Signs: Because this visit was a virtual/telehealth visit, some criteria may be missing or patient reported. Any vitals not documented were not able to be obtained and vitals that have been documented are patient reported.   Cardiac Risk Factors include: advanced age (>23men, >48 women);male gender     Objective:    Today's Vitals   04/29/23 1348  Weight: 252 lb (114.3 kg)  Height: 6\' 3"  (1.905 m)   Body mass index is 31.5 kg/m.     04/29/2023    1:54 PM 04/03/2022   11:50 AM 04/02/2021    9:17 AM 03/31/2020    8:38 AM 05/23/2019    1:48 PM 03/26/2019    8:30 AM 03/20/2018    3:27 PM  Advanced Directives  Does Patient Have a Medical Advance Directive? Yes Yes Yes Yes Yes Yes Yes  Type of Estate agent of [place];Living will Healthcare Power of [place];Living will Living will;Healthcare Power of Attorney Living will;Healthcare Power of [employer] Power of [place];Living will Healthcare Power of [place];Living will Healthcare Power of [place];Living will  Does patient want to make changes to medical advance directive?   No - Patient declined No - Patient declined     Copy of Healthcare Power of Attorney in Chart? No - copy requested No - copy requested No - copy requested No - copy requested  No - copy requested No - copy requested    Current Medications (verified) Outpatient Encounter Medications as of 04/29/2023  Medication Sig    Cholecalciferol (VITAMIN D3) 50 MCG (2000 UT) TABS Take 50 mcg by mouth once.   levothyroxine (SYNTHROID) 137 MCG tablet TAKE 1 TABLET(137 MCG) BY MOUTH DAILY BEFORE BREAKFAST   rosuvastatin (CRESTOR) 10 MG tablet TAKE 1 TABLET(10 MG) BY MOUTH DAILY   telmisartan (MICARDIS) 20 MG tablet TAKE 1 TABLET(20 MG) BY MOUTH DAILY   XARELTO 20 MG TABS tablet TAKE 1 TABLET(20 MG) BY MOUTH DAILY WITH SUPPER   No facility-administered encounter medications on file as of 04/29/2023.    Allergies (verified) Patient has no known allergies.   History: Past Medical History:  Diagnosis Date   Adenomatous colon polyp    Arthritis    mild thumbs   Atrial fibrillation (HCC) 06/17/2010   Dr Lewayne Bunting seen as OP   Atrial fibrillation (HCC) 11/22-23/2013   presentation as nausea, dizziness & chest tightness   Diverticulosis 06/17/2005   Hyperlipidemia    past hx - no issues per pt   Thyroid disease    hypothyroidism   Past Surgical History:  Procedure Laterality Date   CHEST TUBE INSERTION  1982   Spontaneous Pneumothorax   COLONOSCOPY     COLONOSCOPY W/ POLYPECTOMY  2007   Adenomatous Polyp;  GI, Dr Juanda Chance   ENDOVENOUS ABLATION SAPHENOUS VEIN W/ LASER Left 06/19/2022   endovenous laser ablation left greater saphenous vein and stab phlebectomy > 20 incisions left leg by Cari Caraway MD   ENDOVENOUS ABLATION SAPHENOUS VEIN W/ LASER Left 01/01/2023   endovenous laser ablation left  small saphenous vein and stab phlebectomy 10-20 incisions left leg by Cari Caraway MD   Holter monitor-48 hour  03/2011, 12/2010   Dr Sharrell Ku   TONSILLECTOMY     TRANSTHORACIC ECHOCARDIOGRAM  12/2010   Family History  Problem Relation Age of Onset   Stroke Mother 41       cns aneurysm bleed X2   Breast cancer Mother    Valvular heart disease Mother        valve replacement   Hypertension Mother    Hypertension Father    Cerebral aneurysm Sister 12   Hypertension Sister    Diabetes Neg Hx     Colon cancer Neg Hx    Rectal cancer Neg Hx    Stomach cancer Neg Hx    Colon polyps Neg Hx    Esophageal cancer Neg Hx    Social History   Socioeconomic History   Marital status: Married    Spouse name: Clydie Braun   Number of children: 2   Years of education: Not on file   Highest education level: Master's degree (e.g., MA, MS, MEng, MEd, MSW, MBA)  Occupational History   Occupation: Retired  Tobacco Use   Smoking status: Never   Smokeless tobacco: Never  Vaping Use   Vaping status: Never Used  Substance and Sexual Activity   Alcohol use: No   Drug use: No   Sexual activity: Never  Other Topics Concern   Not on file  Social History Narrative   Exercise: 5 days of aerobic, two days of weights   Lives with wife.   Social Determinants of Health   Financial Resource Strain: Low Risk  (02/06/2023)   Overall Financial Resource Strain (CARDIA)    Difficulty of Paying Living Expenses: Not hard at all  Food Insecurity: No Food Insecurity (02/06/2023)   Hunger Vital Sign    Worried About Running Out of Food in the Last Year: Never true    Ran Out of Food in the Last Year: Never true  Transportation Needs: No Transportation Needs (02/06/2023)   PRAPARE - Administrator, Civil Service (Medical): No    Lack of Transportation (Non-Medical): No  Physical Activity: Sufficiently Active (02/06/2023)   Exercise Vital Sign    Days of Exercise per Week: 7 days    Minutes of Exercise per Session: 40 min  Stress: No Stress Concern Present (02/06/2023)   [employer] of Occupational Health - Occupational Stress Questionnaire    Feeling of Stress : Not at all  Social Connections: Socially Integrated (02/06/2023)   Social Connection and Isolation Panel [NHANES]    Frequency of Communication with Friends and Family: More than three times a week    Frequency of Social Gatherings with Friends and Family: More than three times a week    Attends Religious Services: More than 4 times per  year    Active Member of [employer] or Organizations: Yes    Attends Engineer, structural: More than 4 times per year    Marital Status: Married    Tobacco Counseling Counseling given: Not Answered   Clinical Intake:  Pre-visit preparation completed: Yes  Pain : No/denies pain     BMI - recorded: 31.5 Nutritional Status: BMI > 30  Obese Nutritional Risks: None Diabetes: No  How often do you need to have someone help you when you read instructions, pamphlets, or other written materials from your doctor or pharmacy?: 1 - Never  Interpreter Needed?: No  Information  entered by :: Aviela Blundell, Rma   Activities of Daily Living    04/29/2023    1:51 PM  In your present state of health, do you have any difficulty performing the following activities:  Hearing? 0  Vision? 0  Difficulty concentrating or making decisions? 0  Walking or climbing stairs? 0  Dressing or bathing? 0  Doing errands, shopping? 0  Preparing Food and eating ? N  Using the Toilet? N  In the past six months, have you accidently leaked urine? N  Do you have problems with loss of bowel control? N  Managing your Medications? N  Managing your Finances? N  Housekeeping or managing your Housekeeping? N    Patient Care Team: Pincus Sanes, MD as PCP - General (Internal Medicine) Armbruster, Willaim Rayas, MD as Consulting Physician (Gastroenterology) Marinus Maw, MD as Consulting Physician (Cardiology) Maris Berger, MD as Consulting Physician (Ophthalmology)  Indicate any recent Medical Services you may have received from other than [facility] providers in the past year (date may be approximate).     Assessment:   This is a routine wellness examination for Haley.  Hearing/Vision screen Hearing Screening - Comments:: Denies hearing difficulties     Goals Addressed             This Visit's Progress    To maintain my current health status by continuing to eat healthy, stay physically  active and socially active.   On track     Depression Screen    04/03/2022   11:40 AM 11/27/2021   10:46 AM 08/01/2021    8:07 AM 04/02/2021    9:15 AM 03/31/2020    8:36 AM 03/26/2019    7:49 AM 03/20/2018    2:37 PM  PHQ 2/9 Scores  PHQ - 2 Score 0 0 0 0 0 0 0  PHQ- 9 Score  0     0    Fall Risk    04/29/2023    1:54 PM 04/03/2022   11:38 AM 11/27/2021   10:46 AM 08/01/2021    9:05 AM 04/02/2021    9:18 AM  Fall Risk   Falls in the past year? 0 0 1 1 0  Number falls in past yr: 0 0 1 1 0  Injury with Fall? 0 0 0 0 0  Risk for fall due to : No Fall Risks No Fall Risks No Fall Risks No Fall Risks No Fall Risks  Follow up Falls prevention discussed;Falls evaluation completed Falls prevention discussed Falls evaluation completed Falls evaluation completed Falls evaluation completed    MEDICARE RISK AT HOME:    TIMED UP AND GO:  Was the test performed?  No    Cognitive Function:        04/03/2022   12:30 PM  6CIT Screen  What Year? 0 points  What month? 0 points  What time? 0 points  Count back from 20 0 points  Months in reverse 0 points  Repeat phrase 0 points  Total Score 0 points    Immunizations Immunization History  Administered Date(s) Administered   Fluad Quad(high Dose 65+) 03/13/2019, 04/03/2020   Influenza Split 05/07/2012   Influenza, High Dose Seasonal PF 03/14/2017, 03/20/2018, 03/18/2023   Influenza-Unspecified 04/24/2016, 04/02/2022   PFIZER Comirnaty(Gray Top)Covid-19 Tri-Sucrose Vaccine 03/11/2022   PFIZER(Purple Top)SARS-COV-2 Vaccination 07/07/2019, 07/28/2019, 03/15/2020   Pneumococcal Conjugate-13 03/14/2017   Pneumococcal Polysaccharide-23 03/20/2018   Tdap 01/15/2005, 12/25/2015   Zoster Recombinant(Shingrix) 04/03/2023   Zoster, Live 03/31/2012  TDAP status: Up to date  Flu Vaccine status: Up to date  Pneumococcal vaccine status: Up to date  Covid-19 vaccine status: Completed vaccines  Qualifies for Shingles Vaccine?  Yes   Zostavax completed Yes   Shingrix Completed?: No.    Education has been provided regarding the importance of this vaccine. Patient has been advised to call insurance company to determine out of pocket expense if they have not yet received this vaccine. Advised may also receive vaccine at local pharmacy or Health Dept. Verbalized acceptance and understanding.  Screening Tests Health Maintenance  Topic Date Due   COVID-19 Vaccine (5 - 2023-24 season) 02/16/2023   Zoster Vaccines- Shingrix (2 of 2) 05/29/2023   Medicare Annual Wellness (AWV)  04/28/2024   DTaP/Tdap/Td (3 - Td or Tdap) 12/24/2025   Colonoscopy  08/28/2026   Pneumonia Vaccine 96+ Years old  Completed   INFLUENZA VACCINE  Completed   Hepatitis C Screening  Completed   HPV VACCINES  Aged Out    Health Maintenance  Health Maintenance Due  Topic Date Due   COVID-19 Vaccine (5 - 2023-24 season) 02/16/2023    Colorectal cancer screening: Type of screening: Colonoscopy. Completed 08/27/2021. Repeat every 5 years  Lung Cancer Screening: (Low Dose CT Chest recommended if Age 25-80 years, 20 pack-year currently smoking OR have quit w/in 15years.) does not qualify.   Lung Cancer Screening Referral: N?A  Additional Screening:  Hepatitis C Screening: does qualify; Completed 04/20/2018  Vision Screening: Recommended annual ophthalmology exams for early detection of glaucoma and other disorders of the eye. Is the patient up to date with their annual eye exam?  Yes  Who is the provider or what is the name of the office in which the patient attends annual eye exams? [facility] If pt is not established with a provider, would they like to be referred to a provider to establish care? No .   Dental Screening: Recommended annual dental exams for proper oral hygiene   Community Resource Referral / Chronic Care Management: CRR required this visit?  No   CCM required this visit?  No     Plan:     I have personally reviewed  and noted the following in the patient's chart:   Medical and social history Use of alcohol, tobacco or illicit drugs  Current medications and supplements including opioid prescriptions. Patient is not currently taking opioid prescriptions. Functional ability and status Nutritional status Physical activity Advanced directives List of other physicians Hospitalizations, surgeries, and ER visits in previous 12 months Vitals Screenings to include cognitive, depression, and falls Referrals and appointments  In addition, I have reviewed and discussed with patient certain preventive protocols, quality metrics, and best practice recommendations. A written personalized care plan for preventive services as well as general preventive health recommendations were provided to patient.     Rashana Andrew L Jaydon Avina, CMA   04/29/2023   After Visit Summary: (MyChart) Due to this being a telephonic visit, the after visit summary with patients personalized plan was offered to patient via MyChart   Nurse Notes: Patient is up to date on all his health maintenance.  He had no concerns to address today.

## 2023-04-30 ENCOUNTER — Ambulatory Visit: Payer: Medicare PPO | Admitting: Internal Medicine

## 2023-04-30 ENCOUNTER — Encounter: Payer: Self-pay | Admitting: Internal Medicine

## 2023-04-30 NOTE — Progress Notes (Unsigned)
      Subjective:    Patient ID: Elijah [name], male    DOB: [birth year], 72 y.o.   MRN: [id number]     HPI Elijah [name] is here for follow up of his chronic medical problems.   Has sleep study - no OSA>     Medications and allergies reviewed with patient and updated if appropriate.  Current Outpatient Medications on File Prior to Visit  Medication Sig Dispense Refill   Cholecalciferol (VITAMIN D3) 50 MCG (2000 UT) TABS Take 50 mcg by mouth once.     levothyroxine (SYNTHROID) 137 MCG tablet TAKE 1 TABLET(137 MCG) BY MOUTH DAILY BEFORE BREAKFAST 90 tablet 3   rosuvastatin (CRESTOR) 10 MG tablet TAKE 1 TABLET(10 MG) BY MOUTH DAILY 90 tablet 3   telmisartan (MICARDIS) 20 MG tablet TAKE 1 TABLET(20 MG) BY MOUTH DAILY 90 tablet 1   XARELTO 20 MG TABS tablet TAKE 1 TABLET(20 MG) BY MOUTH DAILY WITH SUPPER 90 tablet 1   No current facility-administered medications on file prior to visit.     Review of Systems     Objective:  There were no vitals filed for this visit. BP Readings from Last 3 Encounters:  02/07/23 120/84  01/08/23 111/76  01/07/23 110/72   Wt Readings from Last 3 Encounters:  04/29/23 252 lb (114.3 kg)  02/07/23 252 lb (114.3 kg)  01/08/23 252 lb 14.4 oz (114.7 kg)   There is no height or weight on file to calculate BMI.    Physical Exam     Lab Results  Component Value Date   WBC 7.1 04/09/2022   HGB 16.3 04/09/2022   HCT 49.7 04/09/2022   PLT 172.0 04/09/2022   GLUCOSE 95 11/08/2022   CHOL 107 05/28/2022   TRIG 53.0 05/28/2022   HDL 40.30 05/28/2022   LDLCALC 56 05/28/2022   ALT 15 05/28/2022   AST 35 05/28/2022   NA 139 11/08/2022   K 4.2 11/08/2022   CL 107 11/08/2022   CREATININE 1.02 11/08/2022   BUN 18 11/08/2022   CO2 26 11/08/2022   TSH 1.59 04/09/2022   PSA 2.36 04/09/2022     Assessment & Plan:    See Problem List for Assessment and Plan of chronic medical problems.

## 2023-04-30 NOTE — Patient Instructions (Addendum)
      Blood work was ordered.   The lab is on the first floor.    Medications changes include :  none       Return in about 6 months (around 10/29/2023) for Physical Exam.

## 2023-05-01 ENCOUNTER — Ambulatory Visit: Payer: Medicare PPO | Admitting: Internal Medicine

## 2023-05-01 VITALS — BP 136/72 | HR 90 | Temp 98.0°F | Ht 75.0 in | Wt 253.0 lb

## 2023-05-01 DIAGNOSIS — I10 Essential (primary) hypertension: Secondary | ICD-10-CM | POA: Diagnosis not present

## 2023-05-01 DIAGNOSIS — E039 Hypothyroidism, unspecified: Secondary | ICD-10-CM | POA: Diagnosis not present

## 2023-05-01 DIAGNOSIS — I482 Chronic atrial fibrillation, unspecified: Secondary | ICD-10-CM

## 2023-05-01 DIAGNOSIS — E782 Mixed hyperlipidemia: Secondary | ICD-10-CM | POA: Diagnosis not present

## 2023-05-01 LAB — CBC WITH DIFFERENTIAL/PLATELET
Basophils Absolute: 0 10*3/uL (ref 0.0–0.1)
Basophils Relative: 0.7 % (ref 0.0–3.0)
Eosinophils Absolute: 0.2 10*3/uL (ref 0.0–0.7)
Eosinophils Relative: 2.4 % (ref 0.0–5.0)
HCT: 49.7 % (ref 39.0–52.0)
Hemoglobin: 16.4 g/dL (ref 13.0–17.0)
Lymphocytes Relative: 40.4 % (ref 12.0–46.0)
Lymphs Abs: 2.6 10*3/uL (ref 0.7–4.0)
MCHC: 32.9 g/dL (ref 30.0–36.0)
MCV: 90.8 fL (ref 78.0–100.0)
Monocytes Absolute: 0.8 10*3/uL (ref 0.1–1.0)
Monocytes Relative: 12.3 % — ABNORMAL HIGH (ref 3.0–12.0)
Neutro Abs: 2.9 10*3/uL (ref 1.4–7.7)
Neutrophils Relative %: 44.2 % (ref 43.0–77.0)
Platelets: 169 10*3/uL (ref 150.0–400.0)
RBC: 5.47 Mil/uL (ref 4.22–5.81)
RDW: 13.3 % (ref 11.5–15.5)
WBC: 6.5 10*3/uL (ref 4.0–10.5)

## 2023-05-01 LAB — COMPREHENSIVE METABOLIC PANEL
ALT: 13 U/L (ref 0–53)
AST: 34 U/L (ref 0–37)
Albumin: 4.3 g/dL (ref 3.5–5.2)
Alkaline Phosphatase: 62 U/L (ref 39–117)
BUN: 20 mg/dL (ref 6–23)
CO2: 28 meq/L (ref 19–32)
Calcium: 9.4 mg/dL (ref 8.4–10.5)
Chloride: 105 meq/L (ref 96–112)
Creatinine, Ser: 1.06 mg/dL (ref 0.40–1.50)
GFR: 70.42 mL/min (ref >60.00)
Glucose, Bld: 95 mg/dL (ref 70–99)
Potassium: 4.4 meq/L (ref 3.5–5.1)
Sodium: 138 meq/L (ref 135–145)
Total Bilirubin: 1 mg/dL (ref 0.2–1.2)
Total Protein: 7.4 g/dL (ref 6.0–8.3)

## 2023-05-01 LAB — LIPID PANEL
Cholesterol: 119 mg/dL (ref 0–200)
HDL: 39.1 mg/dL (ref >39.00)
LDL Cholesterol: 71 mg/dL (ref 0–99)
NonHDL: 79.6
Total CHOL/HDL Ratio: 3
Triglycerides: 45 mg/dL (ref 0.0–149.0)
VLDL: 9 mg/dL (ref 0.0–40.0)

## 2023-05-01 LAB — TSH: TSH: 1.55 u[IU]/mL (ref 0.35–5.50)

## 2023-05-01 NOTE — Assessment & Plan Note (Signed)
Chronic  Clinically euthyroid  Check tsh and will titrate med dose if needed Currently taking levothyroxine 137 mcg daily 

## 2023-05-01 NOTE — Assessment & Plan Note (Signed)
Chronic Following with Dr. Lovena Le On Xarelto 20 mg daily CBC, CMP, TSH

## 2023-05-01 NOTE — Assessment & Plan Note (Signed)
Chronic Continue regular exercise Check lipid panel, CMP Continue Crestor 10 mg daily

## 2023-05-01 NOTE — Progress Notes (Signed)
Subjective:   Elijah [name] is a 72 y.o. male who presents for Medicare Annual/Subsequent preventive examination.  Visit Complete: Virtual I connected with  Elijah [name] on 04/29/2023 by a audio enabled telemedicine application and verified that I am speaking with the correct person using two identifiers.  Patient Location: Home  Provider Location: Home Office  I discussed the limitations of evaluation and management by telemedicine. The patient expressed understanding and agreed to proceed.  Vital Signs: Because this visit was a virtual/telehealth visit, some criteria may be missing or patient reported. Any vitals not documented were not able to be obtained and vitals that have been documented are patient reported.   Cardiac Risk Factors include: advanced age (>28men, >37 women);male gender;Other (see comment);dyslipidemia, Risk factor comments: A-Fib     Objective:    Today's Vitals   04/29/23 1348  Weight: 252 lb (114.3 kg)  Height: 6\' 3"  (1.905 m)   Body mass index is 31.5 kg/m.     04/29/2023    1:54 PM 04/03/2022   11:50 AM 04/02/2021    9:17 AM 03/31/2020    8:38 AM 05/23/2019    1:48 PM 03/26/2019    8:30 AM 03/20/2018    3:27 PM  Advanced Directives  Does Patient Have a Medical Advance Directive? Yes Yes Yes Yes Yes Yes Yes  Type of Estate agent of [place];Living will Healthcare Power of [place];Living will Living will;Healthcare Power of Attorney Living will;Healthcare Power of [employer] Power of [place];Living will Healthcare Power of [place];Living will Healthcare Power of [place];Living will  Does patient want to make changes to medical advance directive?   No - Patient declined No - Patient declined     Copy of Healthcare Power of Attorney in Chart? No - copy requested No - copy requested No - copy requested No - copy requested  No - copy requested No - copy requested    Current Medications (verified) Outpatient  Encounter Medications as of 04/29/2023  Medication Sig   Cholecalciferol (VITAMIN D3) 50 MCG (2000 UT) TABS Take 50 mcg by mouth once.   levothyroxine (SYNTHROID) 137 MCG tablet TAKE 1 TABLET(137 MCG) BY MOUTH DAILY BEFORE BREAKFAST   rosuvastatin (CRESTOR) 10 MG tablet TAKE 1 TABLET(10 MG) BY MOUTH DAILY   telmisartan (MICARDIS) 20 MG tablet TAKE 1 TABLET(20 MG) BY MOUTH DAILY   XARELTO 20 MG TABS tablet TAKE 1 TABLET(20 MG) BY MOUTH DAILY WITH SUPPER   No facility-administered encounter medications on file as of 04/29/2023.    Allergies (verified) Patient has no known allergies.   History: Past Medical History:  Diagnosis Date   Adenomatous colon polyp    Arthritis    mild thumbs   Atrial fibrillation (HCC) 06/17/2010   Dr Lewayne Bunting seen as OP   Atrial fibrillation (HCC) 11/22-23/2013   presentation as nausea, dizziness & chest tightness   Diverticulosis 06/17/2005   Hyperlipidemia    past hx - no issues per pt   Thyroid disease    hypothyroidism   Past Surgical History:  Procedure Laterality Date   CHEST TUBE INSERTION  1982   Spontaneous Pneumothorax   COLONOSCOPY     COLONOSCOPY W/ POLYPECTOMY  2007   Adenomatous Polyp; [facility], Dr Juanda Chance   ENDOVENOUS ABLATION SAPHENOUS VEIN W/ LASER Left 06/19/2022   endovenous laser ablation left greater saphenous vein and stab phlebectomy > 20 incisions left leg by Cari Caraway MD   ENDOVENOUS ABLATION SAPHENOUS VEIN W/ LASER Left 01/01/2023  endovenous laser ablation left small saphenous vein and stab phlebectomy 10-20 incisions left leg by Cari Caraway MD   Holter monitor-48 hour  03/2011, 12/2010   Dr Sharrell Ku   TONSILLECTOMY     TRANSTHORACIC ECHOCARDIOGRAM  12/2010   Family History  Problem Relation Age of Onset   Stroke Mother 67       cns aneurysm bleed X2   Breast cancer Mother    Valvular heart disease Mother        valve replacement   Hypertension Mother    Hypertension Father    Cerebral aneurysm  Sister 36   Hypertension Sister    Diabetes Neg Hx    Colon cancer Neg Hx    Rectal cancer Neg Hx    Stomach cancer Neg Hx    Colon polyps Neg Hx    Esophageal cancer Neg Hx    Social History   Socioeconomic History   Marital status: Married    Spouse name: Clydie Braun   Number of children: 2   Years of education: Not on file   Highest education level: Master's degree (e.g., MA, MS, MEng, MEd, MSW, MBA)  Occupational History   Occupation: Retired  Tobacco Use   Smoking status: Never   Smokeless tobacco: Never  Vaping Use   Vaping status: Never Used  Substance and Sexual Activity   Alcohol use: No   Drug use: No   Sexual activity: Never  Other Topics Concern   Not on file  Social History Narrative   Exercise: 5 days of aerobic, two days of weights   Lives with wife.   Social Determinants of Health   Financial Resource Strain: Low Risk  (04/29/2023)   Overall Financial Resource Strain (CARDIA)    Difficulty of Paying Living Expenses: Not hard at all  Food Insecurity: No Food Insecurity (04/29/2023)   Hunger Vital Sign    Worried About Running Out of Food in the Last Year: Never true    Ran Out of Food in the Last Year: Never true  Transportation Needs: No Transportation Needs (04/29/2023)   PRAPARE - Administrator, Civil Service (Medical): No    Lack of Transportation (Non-Medical): No  Physical Activity: Sufficiently Active (04/29/2023)   Exercise Vital Sign    Days of Exercise per Week: 5 days    Minutes of Exercise per Session: 60 min  Stress: No Stress Concern Present (04/29/2023)   [employer] of Occupational Health - Occupational Stress Questionnaire    Feeling of Stress : Not at all  Social Connections: Socially Integrated (04/29/2023)   Social Connection and Isolation Panel [NHANES]    Frequency of Communication with Friends and Family: More than three times a week    Frequency of Social Gatherings with Friends and Family: More than three  times a week    Attends Religious Services: More than 4 times per year    Active Member of [employer] or Organizations: Yes    Attends Engineer, structural: More than 4 times per year    Marital Status: Married    Tobacco Counseling Counseling given: Not Answered   Clinical Intake:  Pre-visit preparation completed: Yes  Pain : No/denies pain     BMI - recorded: 31.5 Nutritional Status: BMI > 30  Obese Nutritional Risks: None Diabetes: No  How often do you need to have someone help you when you read instructions, pamphlets, or other written materials from your doctor or pharmacy?: 1 - Never  Interpreter  Needed?: No  Information entered by :: Edder Bellanca, Rma   Activities of Daily Living    04/29/2023    1:51 PM  In your present state of health, do you have any difficulty performing the following activities:  Hearing? 0  Vision? 0  Difficulty concentrating or making decisions? 0  Walking or climbing stairs? 0  Dressing or bathing? 0  Doing errands, shopping? 0  Preparing Food and eating ? N  Using the Toilet? N  In the past six months, have you accidently leaked urine? N  Do you have problems with loss of bowel control? N  Managing your Medications? N  Managing your Finances? N  Housekeeping or managing your Housekeeping? N    Patient Care Team: Pincus Sanes, MD as PCP - General (Internal Medicine) Armbruster, Willaim Rayas, MD as Consulting Physician (Gastroenterology) Marinus Maw, MD as Consulting Physician (Cardiology) Maris Berger, MD as Consulting Physician (Ophthalmology)  Indicate any recent Medical Services you may have received from other than [facility] providers in the past year (date may be approximate).     Assessment:   This is a routine wellness examination for [place].  Hearing/Vision screen Hearing Screening - Comments:: Denies hearing difficulties     Goals Addressed             This Visit's Progress    To maintain my  current health status by continuing to eat healthy, stay physically active and socially active.   On track     Depression Screen    05/01/2023    9:20 AM 04/29/2023    1:59 PM 04/03/2022   11:40 AM 11/27/2021   10:46 AM 08/01/2021    8:07 AM 04/02/2021    9:15 AM 03/31/2020    8:36 AM  PHQ 2/9 Scores  PHQ - 2 Score 0 0 0 0 0 0 0  PHQ- 9 Score 0 0  0       Fall Risk    05/01/2023    9:20 AM 04/29/2023    1:54 PM 04/03/2022   11:38 AM 11/27/2021   10:46 AM 08/01/2021    9:05 AM  Fall Risk   Falls in the past year? 0 0 0 1 1  Number falls in past yr: 0 0 0 1 1  Injury with Fall? 0 0 0 0 0  Risk for fall due to : No Fall Risks No Fall Risks No Fall Risks No Fall Risks No Fall Risks  Follow up Falls evaluation completed Falls prevention discussed;Falls evaluation completed Falls prevention discussed Falls evaluation completed Falls evaluation completed    MEDICARE RISK AT HOME: Medicare Risk at Home Any stairs in or around the home?: Yes If so, are there any without handrails?: Yes Home free of loose throw rugs in walkways, pet beds, electrical cords, etc?: Yes Adequate lighting in your home to reduce risk of falls?: Yes Life alert?: No Use of a cane, walker or w/c?: No Grab bars in the bathroom?: No Shower chair or bench in shower?: Yes Elevated toilet seat or a handicapped toilet?: No  TIMED UP AND GO:  Was the test performed?  No    Cognitive Function:        04/29/2023    1:56 PM 04/03/2022   12:30 PM  6CIT Screen  What Year? 0 points 0 points  What month? 0 points 0 points  What time? 0 points 0 points  Count back from 20 0 points 0 points  Months in reverse  0 points 0 points  Repeat phrase 0 points 0 points  Total Score 0 points 0 points    Immunizations Immunization History  Administered Date(s) Administered   Fluad Quad(high Dose 65+) 03/13/2019, 04/03/2020   Influenza Split 05/07/2012   Influenza, High Dose Seasonal PF 03/14/2017, 03/20/2018,  03/18/2023   Influenza-Unspecified 04/24/2016, 04/02/2022   PFIZER Comirnaty(Gray Top)Covid-19 Tri-Sucrose Vaccine 03/11/2022, 03/25/2023   PFIZER(Purple Top)SARS-COV-2 Vaccination 07/07/2019, 07/28/2019, 03/15/2020   Pneumococcal Conjugate-13 03/14/2017   Pneumococcal Polysaccharide-23 03/20/2018   Tdap 01/15/2005, 12/25/2015   Zoster Recombinant(Shingrix) 04/03/2023   Zoster, Live 03/31/2012    TDAP status: Up to date  Flu Vaccine status: Up to date  Pneumococcal vaccine status: Up to date  Covid-19 vaccine status: Completed vaccines  Qualifies for Shingles Vaccine? Yes   Zostavax completed Yes   Shingrix Completed?: No.    Education has been provided regarding the importance of this vaccine. Patient has been advised to call insurance company to determine out of pocket expense if they have not yet received this vaccine. Advised may also receive vaccine at local pharmacy or Health Dept. Verbalized acceptance and understanding.  Screening Tests Health Maintenance  Topic Date Due   COVID-19 Vaccine (6 - 2023-24 season) 05/20/2023   Zoster Vaccines- Shingrix (2 of 2) 05/29/2023   Medicare Annual Wellness (AWV)  04/28/2024   DTaP/Tdap/Td (3 - Td or Tdap) 12/24/2025   Colonoscopy  08/28/2026   Pneumonia Vaccine 36+ Years old  Completed   INFLUENZA VACCINE  Completed   Hepatitis C Screening  Completed   HPV VACCINES  Aged Out    Health Maintenance  There are no preventive care reminders to display for this patient.   Colorectal cancer screening: Type of screening: Colonoscopy. Completed 08/27/2021. Repeat every 5 years  Lung Cancer Screening: (Low Dose CT Chest recommended if Age 76-80 years, 20 pack-year currently smoking OR have quit w/in 15years.) does not qualify.   Lung Cancer Screening Referral: N?A  Additional Screening:  Hepatitis C Screening: does qualify; Completed 04/20/2018  Vision Screening: Recommended annual ophthalmology exams for early detection of  glaucoma and other disorders of the eye. Is the patient up to date with their annual eye exam?  Yes  Who is the provider or what is the name of the office in which the patient attends annual eye exams? [facility] If pt is not established with a provider, would they like to be referred to a provider to establish care? No .   Dental Screening: Recommended annual dental exams for proper oral hygiene   Community Resource Referral / Chronic Care Management: CRR required this visit?  No   CCM required this visit?  No     Plan:     I have personally reviewed and noted the following in the patient's chart:   Medical and social history Use of alcohol, tobacco or illicit drugs  Current medications and supplements including opioid prescriptions. Patient is not currently taking opioid prescriptions. Functional ability and status Nutritional status Physical activity Advanced directives List of other physicians Hospitalizations, surgeries, and ER visits in previous 12 months Vitals Screenings to include cognitive, depression, and falls Referrals and appointments  In addition, I have reviewed and discussed with patient certain preventive protocols, quality metrics, and best practice recommendations. A written personalized care plan for preventive services as well as general preventive health recommendations were provided to patient.     Violia Knopf L Norvil Martensen, CMA   05/01/2023   After Visit Summary: (MyChart) Due to this being a  telephonic visit, the after visit summary with patients personalized plan was offered to patient via MyChart   Nurse Notes: Patient is up to date on all his health maintenance.  He had no concerns to address today.

## 2023-05-01 NOTE — Assessment & Plan Note (Signed)
Chronic Blood pressure well controlled Continue telmisartan 20 mg daily CMP Continue to monitor BP at home

## 2023-05-02 ENCOUNTER — Encounter: Payer: Self-pay | Admitting: Internal Medicine

## 2023-05-06 ENCOUNTER — Encounter: Payer: Self-pay | Admitting: Internal Medicine

## 2023-08-10 DIAGNOSIS — U071 COVID-19: Secondary | ICD-10-CM | POA: Diagnosis not present

## 2023-08-10 DIAGNOSIS — R509 Fever, unspecified: Secondary | ICD-10-CM | POA: Diagnosis not present

## 2023-08-10 DIAGNOSIS — R110 Nausea: Secondary | ICD-10-CM | POA: Diagnosis not present

## 2023-08-10 DIAGNOSIS — R051 Acute cough: Secondary | ICD-10-CM | POA: Diagnosis not present

## 2023-08-11 ENCOUNTER — Telehealth: Payer: Self-pay | Admitting: Internal Medicine

## 2023-08-11 ENCOUNTER — Encounter: Payer: Self-pay | Admitting: Internal Medicine

## 2023-08-11 MED ORDER — NIRMATRELVIR/RITONAVIR (PAXLOVID)TABLET: 3.0000 | ORAL_TABLET | Freq: Two times a day (BID) | ORAL | 0 refills | Status: AC

## 2023-08-11 NOTE — Telephone Encounter (Signed)
 Spoke with Pt. Told pt Dr Ladona Ridgel was ok with holding xarelto. Pt stated Thanks

## 2023-08-11 NOTE — Telephone Encounter (Signed)
Addressed in phone note.

## 2023-08-11 NOTE — Addendum Note (Signed)
 Addended by: Pincus Sanes on: 08/11/2023 04:39 PM   Modules accepted: Orders

## 2023-08-11 NOTE — Telephone Encounter (Signed)
 Pt c/o medication issue:  1. Name of Medication:   XARELTO 20 MG TABS tablet  Paxlovid  2. How are you currently taking this medication (dosage and times per day)?   TAKE 1 TABLET(20 MG) BY MOUTH DAILY WITH SUPPER    3. Are you having a reaction (difficulty breathing--STAT)? No  4. What is your medication issue?  Patient is Covid + and was informed by his PCP to start taking a new medication to help him recover. Patient stated this new medication does not interact well with the Xarelto and would like to hold this medication for a few days. Please advise.

## 2023-08-18 ENCOUNTER — Telehealth: Payer: Self-pay | Admitting: Podiatry

## 2023-08-18 NOTE — Telephone Encounter (Signed)
 Pts wife left message today at 1217pm stating he needs an appt with Dr Charlsie Merles. He has a hard white place( ulcer like) causing pt pain and hard to walk on.  I returned call and left message for pt to call to schedule an appt.

## 2023-08-21 ENCOUNTER — Encounter: Payer: Self-pay | Admitting: Podiatry

## 2023-08-21 ENCOUNTER — Ambulatory Visit: Admitting: Podiatry

## 2023-08-21 DIAGNOSIS — L923 Foreign body granuloma of the skin and subcutaneous tissue: Secondary | ICD-10-CM | POA: Diagnosis not present

## 2023-08-22 ENCOUNTER — Encounter: Payer: Self-pay | Admitting: Internal Medicine

## 2023-08-22 NOTE — Progress Notes (Signed)
 Subjective:   Patient ID: Elijah [name], male   DOB: 73 y.o.   MRN: [id number]   HPI Patient states that over the last couple months has started to develop a lot of inflammation again where the previous piece of glass was removed last year and feels like he is walking on something again   ROS      Objective:  Physical Exam  Neurovascular status intact with a very tender area plantar aspect left heel that may be a second piece of glass which has backed out over this period of time     Assessment:  Probability for foreign body plantar left     Plan:  Sterile sharp debridement of the area after anesthetic and sterile prep.  I did open the area I did note a small little shard which I removed I flushed the area I did not note any further glass in the area applied sterile dressing and hopefully this will be the end of the problem with patient to be seen back if symptoms were to continue and may require a more invasive opening of the area

## 2023-08-23 DIAGNOSIS — R059 Cough, unspecified: Secondary | ICD-10-CM | POA: Diagnosis not present

## 2023-08-23 DIAGNOSIS — J411 Mucopurulent chronic bronchitis: Secondary | ICD-10-CM | POA: Diagnosis not present

## 2023-08-23 DIAGNOSIS — J329 Chronic sinusitis, unspecified: Secondary | ICD-10-CM | POA: Diagnosis not present

## 2023-08-24 MED ORDER — HYDROCOD POLI-CHLORPHE POLI ER 10-8 MG/5ML PO SUER: 5.0000 mL | Freq: Two times a day (BID) | ORAL | 0 refills | Status: DC | PRN

## 2023-09-08 ENCOUNTER — Other Ambulatory Visit: Payer: Self-pay | Admitting: Internal Medicine

## 2023-09-08 DIAGNOSIS — I482 Chronic atrial fibrillation, unspecified: Secondary | ICD-10-CM

## 2023-09-08 NOTE — Telephone Encounter (Signed)
 Prescription refill request for Xarelto received.  Indication: Afib  Last office visit: 01/07/23 Ladona Ridgel)  Weight: 114.8kg Age: 73 Scr: 1.06 (05/01/23)  CrCl: 102.58ml/min  Appropriate dose. Refill sent.

## 2023-09-08 NOTE — Telephone Encounter (Signed)
 Prescription refill request for Xarelto received.  Indication: afib  Last office visit: Weight: 01/07/2023 Age: 73 yo  Scr: 1.06, 05/01/2023 CrCl: 102 ml/min

## 2023-09-12 ENCOUNTER — Other Ambulatory Visit: Payer: Self-pay | Admitting: Internal Medicine

## 2023-10-17 ENCOUNTER — Telehealth: Payer: Self-pay | Admitting: Internal Medicine

## 2023-10-17 NOTE — Telephone Encounter (Signed)
 Pt has a lab visit scheduled for 10/27/2023, please add orders for this visit.

## 2023-10-18 ENCOUNTER — Other Ambulatory Visit: Payer: Self-pay | Admitting: Internal Medicine

## 2023-10-18 DIAGNOSIS — I482 Chronic atrial fibrillation, unspecified: Secondary | ICD-10-CM

## 2023-10-18 DIAGNOSIS — E782 Mixed hyperlipidemia: Secondary | ICD-10-CM

## 2023-10-18 DIAGNOSIS — I10 Essential (primary) hypertension: Secondary | ICD-10-CM

## 2023-10-18 DIAGNOSIS — E039 Hypothyroidism, unspecified: Secondary | ICD-10-CM

## 2023-10-18 DIAGNOSIS — Z125 Encounter for screening for malignant neoplasm of prostate: Secondary | ICD-10-CM

## 2023-10-18 NOTE — Telephone Encounter (Signed)
 ordered

## 2023-10-27 ENCOUNTER — Encounter: Payer: Self-pay | Admitting: Internal Medicine

## 2023-10-27 ENCOUNTER — Other Ambulatory Visit: Payer: Medicare PPO

## 2023-10-27 ENCOUNTER — Other Ambulatory Visit (INDEPENDENT_AMBULATORY_CARE_PROVIDER_SITE_OTHER): Payer: Medicare PPO

## 2023-10-27 DIAGNOSIS — I10 Essential (primary) hypertension: Secondary | ICD-10-CM

## 2023-10-27 DIAGNOSIS — E782 Mixed hyperlipidemia: Secondary | ICD-10-CM

## 2023-10-27 DIAGNOSIS — E039 Hypothyroidism, unspecified: Secondary | ICD-10-CM | POA: Diagnosis not present

## 2023-10-27 DIAGNOSIS — I482 Chronic atrial fibrillation, unspecified: Secondary | ICD-10-CM | POA: Diagnosis not present

## 2023-10-27 DIAGNOSIS — Z125 Encounter for screening for malignant neoplasm of prostate: Secondary | ICD-10-CM

## 2023-10-27 LAB — COMPREHENSIVE METABOLIC PANEL WITH GFR
ALT: 12 U/L (ref 0–53)
AST: 28 U/L (ref 0–37)
Albumin: 4.2 g/dL (ref 3.5–5.2)
Alkaline Phosphatase: 61 U/L (ref 39–117)
BUN: 20 mg/dL (ref 6–23)
CO2: 26 meq/L (ref 19–32)
Calcium: 9.1 mg/dL (ref 8.4–10.5)
Chloride: 105 meq/L (ref 96–112)
Creatinine, Ser: 1.07 mg/dL (ref 0.40–1.50)
GFR: 69.39 mL/min (ref >60.00)
Glucose, Bld: 103 mg/dL — ABNORMAL HIGH (ref 70–99)
Potassium: 4.3 meq/L (ref 3.5–5.1)
Sodium: 139 meq/L (ref 135–145)
Total Bilirubin: 1.1 mg/dL (ref 0.2–1.2)
Total Protein: 7 g/dL (ref 6.0–8.3)

## 2023-10-27 LAB — CBC WITH DIFFERENTIAL/PLATELET
Basophils Absolute: 0.1 10*3/uL (ref 0.0–0.1)
Basophils Relative: 0.9 % (ref 0.0–3.0)
Eosinophils Absolute: 0.1 10*3/uL (ref 0.0–0.7)
Eosinophils Relative: 2 % (ref 0.0–5.0)
HCT: 47 % (ref 39.0–52.0)
Hemoglobin: 15.6 g/dL (ref 13.0–17.0)
Lymphocytes Relative: 38.2 % (ref 12.0–46.0)
Lymphs Abs: 2.5 10*3/uL (ref 0.7–4.0)
MCHC: 33.2 g/dL (ref 30.0–36.0)
MCV: 91 fl (ref 78.0–100.0)
Monocytes Absolute: 0.9 10*3/uL (ref 0.1–1.0)
Monocytes Relative: 14.1 % — ABNORMAL HIGH (ref 3.0–12.0)
Neutro Abs: 2.9 10*3/uL (ref 1.4–7.7)
Neutrophils Relative %: 44.8 % (ref 43.0–77.0)
Platelets: 170 10*3/uL (ref 150.0–400.0)
RBC: 5.16 Mil/uL (ref 4.22–5.81)
RDW: 14.1 % (ref 11.5–15.5)
WBC: 6.5 10*3/uL (ref 4.0–10.5)

## 2023-10-27 LAB — LIPID PANEL
Cholesterol: 116 mg/dL (ref 0–200)
HDL: 42.5 mg/dL (ref >39.00)
LDL Cholesterol: 61 mg/dL (ref 0–99)
NonHDL: 73.76
Total CHOL/HDL Ratio: 3
Triglycerides: 64 mg/dL (ref 0.0–149.0)
VLDL: 12.8 mg/dL (ref 0.0–40.0)

## 2023-10-27 LAB — PSA, MEDICARE: PSA: 2.63 ng/mL (ref 0.10–4.00)

## 2023-10-27 LAB — TSH: TSH: 2.87 u[IU]/mL (ref 0.35–5.50)

## 2023-11-13 ENCOUNTER — Encounter: Payer: Self-pay | Admitting: Internal Medicine

## 2023-11-13 DIAGNOSIS — I872 Venous insufficiency (chronic) (peripheral): Secondary | ICD-10-CM | POA: Insufficient documentation

## 2023-11-13 DIAGNOSIS — I2510 Atherosclerotic heart disease of native coronary artery without angina pectoris: Secondary | ICD-10-CM | POA: Insufficient documentation

## 2023-11-13 NOTE — Patient Instructions (Signed)
 Medications changes include :   None     Return in about 8 months (around 07/16/2024) for follow up.   Health Maintenance, Male Adopting a healthy lifestyle and getting preventive care are important in promoting health and wellness. Ask your health care provider about: The right schedule for you to have regular tests and exams. Things you can do on your own to prevent diseases and keep yourself healthy. What should I know about diet, weight, and exercise? Eat a healthy diet  Eat a diet that includes plenty of vegetables, fruits, low-fat dairy products, and lean protein. Do not eat a lot of foods that are high in solid fats, added sugars, or sodium. Maintain a healthy weight Body mass index (BMI) is a measurement that can be used to identify possible weight problems. It estimates body fat based on height and weight. Your health care provider can help determine your BMI and help you achieve or maintain a healthy weight. Get regular exercise Get regular exercise. This is one of the most important things you can do for your health. Most adults should: Exercise for at least 150 minutes each week. The exercise should increase your heart rate and make you sweat (moderate-intensity exercise). Do strengthening exercises at least twice a week. This is in addition to the moderate-intensity exercise. Spend less time sitting. Even light physical activity can be beneficial. Watch cholesterol and blood lipids Have your blood tested for lipids and cholesterol at 73 years of age, then have this test every 5 years. You may need to have your cholesterol levels checked more often if: Your lipid or cholesterol levels are high. You are older than 73 years of age. You are at high risk for heart disease. What should I know about cancer screening? Many types of cancers can be detected early and may often be prevented. Depending on your health history and family history, you may need to have cancer  screening at various ages. This may include screening for: Colorectal cancer. Prostate cancer. Skin cancer. Lung cancer. What should I know about heart disease, diabetes, and high blood pressure? Blood pressure and heart disease High blood pressure causes heart disease and increases the risk of stroke. This is more likely to develop in people who have high blood pressure readings or are overweight. Talk with your health care provider about your target blood pressure readings. Have your blood pressure checked: Every 3-5 years if you are 64-65 years of age. Every year if you are 36 years old or older. If you are between the ages of 48 and 2 and are a current or former smoker, ask your health care provider if you should have a one-time screening for abdominal aortic aneurysm (AAA). Diabetes Have regular diabetes screenings. This checks your fasting blood sugar level. Have the screening done: Once every three years after age 84 if you are at a normal weight and have a low risk for diabetes. More often and at a younger age if you are overweight or have a high risk for diabetes. What should I know about preventing infection? Hepatitis B If you have a higher risk for hepatitis B, you should be screened for this virus. Talk with your health care provider to find out if you are at risk for hepatitis B infection. Hepatitis C Blood testing is recommended for: Everyone born from [birth year] through 1965. Anyone with known risk factors for hepatitis C. Sexually transmitted infections (STIs) You should be screened each year for STIs,  including gonorrhea and chlamydia, if: You are sexually active and are younger than 73 years of age. You are older than 73 years of age and your health care provider tells you that you are at risk for this type of infection. Your sexual activity has changed since you were last screened, and you are at increased risk for chlamydia or gonorrhea. Ask your health care provider if  you are at risk. Ask your health care provider about whether you are at high risk for HIV. Your health care provider may recommend a prescription medicine to help prevent HIV infection. If you choose to take medicine to prevent HIV, you should first get tested for HIV. You should then be tested every 3 months for as long as you are taking the medicine. Follow these instructions at home: Alcohol use Do not drink alcohol if your health care provider tells you not to drink. If you drink alcohol: Limit how much you have to 0-2 drinks a day. Know how much alcohol is in your drink. In the [place] one drink equals one 12 oz bottle of beer (355 mL), one 5 oz glass of wine (148 mL), or one 1 oz glass of hard liquor (44 mL). Lifestyle Do not use any products that contain nicotine or tobacco. These products include cigarettes, chewing tobacco, and vaping devices, such as e-cigarettes. If you need help quitting, ask your health care provider. Do not use street drugs. Do not share needles. Ask your health care provider for help if you need support or information about quitting drugs. General instructions Schedule regular health, dental, and eye exams. Stay current with your vaccines. Tell your health care provider if: You often feel depressed. You have ever been abused or do not feel safe at home. Summary Adopting a healthy lifestyle and getting preventive care are important in promoting health and wellness. Follow your health care provider's instructions about healthy diet, exercising, and getting tested or screened for diseases. Follow your health care provider's instructions on monitoring your cholesterol and blood pressure. This information is not intended to replace advice given to you by your health care provider. Make sure you discuss any questions you have with your health care provider. Document Revised: 10/23/2020 Document Reviewed: 10/23/2020 Elsevier Patient Education  2024 [employer].

## 2023-11-13 NOTE — Progress Notes (Unsigned)
    Subjective:    Patient ID: Elijah [name], male    DOB: 1950-08-31, 73 y.o.   MRN: [id number]     HPI Naseem is here for a physical exam and his chronic medical problems.   Had blood work done already.    Medications and allergies reviewed with patient and updated if appropriate.  Current Outpatient Medications on File Prior to Visit  Medication Sig Dispense Refill   chlorpheniramine-HYDROcodone  (TUSSIONEX) 10-8 MG/5ML Take 5 mLs by mouth every 12 (twelve) hours as needed. 115 mL 0   Cholecalciferol (VITAMIN D3) 50 MCG (2000 UT) TABS Take 50 mcg by mouth once.     levothyroxine  (SYNTHROID ) 137 MCG tablet TAKE 1 TABLET(137 MCG) BY MOUTH DAILY BEFORE BREAKFAST 90 tablet 3   rosuvastatin  (CRESTOR ) 10 MG tablet TAKE 1 TABLET(10 MG) BY MOUTH DAILY 90 tablet 3   telmisartan  (MICARDIS ) 20 MG tablet TAKE 1 TABLET(20 MG) BY MOUTH DAILY 90 tablet 1   XARELTO  20 MG TABS tablet TAKE 1 TABLET(20 MG) BY MOUTH DAILY WITH SUPPER 90 tablet 1   No current facility-administered medications on file prior to visit.    Review of Systems     Objective:  There were no vitals filed for this visit. There were no vitals filed for this visit. There is no height or weight on file to calculate BMI.  BP Readings from Last 3 Encounters:  05/01/23 136/72  02/07/23 120/84  01/08/23 111/76    Wt Readings from Last 3 Encounters:  05/01/23 253 lb (114.8 kg)  04/29/23 252 lb (114.3 kg)  02/07/23 252 lb (114.3 kg)      Physical Exam Constitutional: He appears well-developed and well-nourished. No distress.  HENT:  Head: Normocephalic and atraumatic.  Right Ear: External ear normal.  Left Ear: External ear normal.  Normal ear canals and TM b/l  Mouth/Throat: Oropharynx is clear and moist. Eyes: Conjunctivae and EOM are normal.  Neck: Neck supple. No tracheal deviation present. No thyromegaly present.  No carotid bruit  Cardiovascular: Normal rate, regular rhythm, normal heart sounds and  intact distal pulses.   No murmur heard.  No lower extremity edema. Pulmonary/Chest: Effort normal and breath sounds normal. No respiratory distress. He has no wheezes. He has no rales.  Abdominal: Soft. He exhibits no distension. There is no tenderness.  Genitourinary: deferred  Lymphadenopathy:   He has no cervical adenopathy.  Skin: Skin is warm and dry. He is not diaphoretic.  Psychiatric: He has a normal mood and affect. His behavior is normal.         Assessment & Plan:   Physical exam: Screening blood work  reviewed Exercise    Weight   Substance abuse   none   Reviewed recommended immunizations.   Health Maintenance  Topic Date Due   COVID-19 Vaccine (6 - 2024-25 season) 05/20/2023   INFLUENZA VACCINE  01/16/2024   Medicare Annual Wellness (AWV)  04/28/2024   DTaP/Tdap/Td (3 - Td or Tdap) 12/24/2025   Colonoscopy  08/28/2026   Pneumonia Vaccine 65+ Years old  Completed   Hepatitis C Screening  Completed   Zoster Vaccines- Shingrix  Completed   HPV VACCINES  Aged Out   Meningococcal B Vaccine  Aged Out     See Problem List for Assessment and Plan of chronic medical problems.

## 2023-11-14 ENCOUNTER — Ambulatory Visit (INDEPENDENT_AMBULATORY_CARE_PROVIDER_SITE_OTHER): Admitting: Internal Medicine

## 2023-11-14 ENCOUNTER — Encounter: Admitting: Internal Medicine

## 2023-11-14 VITALS — BP 132/80 | HR 70 | Temp 98.0°F | Ht 75.0 in | Wt 255.0 lb

## 2023-11-14 DIAGNOSIS — I2510 Atherosclerotic heart disease of native coronary artery without angina pectoris: Secondary | ICD-10-CM | POA: Diagnosis not present

## 2023-11-14 DIAGNOSIS — I10 Essential (primary) hypertension: Secondary | ICD-10-CM | POA: Diagnosis not present

## 2023-11-14 DIAGNOSIS — E782 Mixed hyperlipidemia: Secondary | ICD-10-CM

## 2023-11-14 DIAGNOSIS — I872 Venous insufficiency (chronic) (peripheral): Secondary | ICD-10-CM | POA: Diagnosis not present

## 2023-11-14 DIAGNOSIS — E039 Hypothyroidism, unspecified: Secondary | ICD-10-CM | POA: Diagnosis not present

## 2023-11-14 DIAGNOSIS — Z0000 Encounter for general adult medical examination without abnormal findings: Secondary | ICD-10-CM

## 2023-11-14 DIAGNOSIS — I482 Chronic atrial fibrillation, unspecified: Secondary | ICD-10-CM

## 2023-11-14 NOTE — Assessment & Plan Note (Signed)
 Chronic Lab Results  Component Value Date   LDLCALC 61 10/27/2023   LDL at goal Continue regular exercise Continue Crestor  10 mg daily

## 2023-11-14 NOTE — Assessment & Plan Note (Signed)
 Chronic Following with Dr. Carolynne Citron On Xarelto  20 mg daily CBC, CMP, TSH done-reviewed

## 2023-11-14 NOTE — Assessment & Plan Note (Signed)
 Chronic Follows with vascular-has had some spontaneous bleeds and has edema Has had laser ablation in addition to sclerotherapy Wears compression  socks Continue regular exercise

## 2023-11-14 NOTE — Assessment & Plan Note (Signed)
 Chronic Blood pressure well controlled Continue telmisartan  20 mg daily Continue to monitor BP at home

## 2023-11-14 NOTE — Assessment & Plan Note (Signed)
 Chronic  Lab Results  Component Value Date   TSH 2.87 10/27/2023   Clinically euthyroid Currently taking levothyroxine  137 mcg daily

## 2023-11-14 NOTE — Assessment & Plan Note (Signed)
 Chronic CT CAC in 2023 was 351 No symptoms consistent with angina Blood pressure well-controlled Continue Crestor  10 mg daily, Xarelto  20 mg daily

## 2023-12-21 ENCOUNTER — Other Ambulatory Visit: Payer: Self-pay | Admitting: Internal Medicine

## 2023-12-21 DIAGNOSIS — I482 Chronic atrial fibrillation, unspecified: Secondary | ICD-10-CM

## 2023-12-22 NOTE — Telephone Encounter (Signed)
 Prescription refill request for Xarelto  received.  Indication: AF Last office visit:01/07/23  KANDICE Birmingham MD Weight: 115.2kg Age: 73 Scr: 1.07 on 10/27/23  Epic CrCl: 101.68  Based on above findings Xarelto  20mg  daily is the appropriate dose.  Refill approved.

## 2024-01-14 ENCOUNTER — Telehealth: Payer: Self-pay | Admitting: *Deleted

## 2024-01-14 ENCOUNTER — Other Ambulatory Visit: Payer: Self-pay | Admitting: *Deleted

## 2024-01-14 DIAGNOSIS — I83892 Varicose veins of left lower extremities with other complications: Secondary | ICD-10-CM

## 2024-01-14 NOTE — Telephone Encounter (Signed)
 Returning Telephone message from Mr. Blew. He is an established patient who has a history of bleeding varicosities left leg. Mr. Lindholm had endovenous laser ablation left small saphenous vein July 2024 by Dr. Eliza and sclerotherapy (left leg) August 2024 by Inocente Row RN. Mr. Stoiber reports he has had no bleeding episodes from varicosities but he has new veins above his left knee that he would like evaluated.  Informed him that Dr. Eliza has retired.  Will advise schedulers to make a VV FU appointment with venous reflux (left leg). Mr, Rudin verbalized understanding.

## 2024-01-20 ENCOUNTER — Other Ambulatory Visit: Payer: Self-pay | Admitting: Internal Medicine

## 2024-01-31 ENCOUNTER — Other Ambulatory Visit: Payer: Self-pay | Admitting: Internal Medicine

## 2024-03-05 ENCOUNTER — Other Ambulatory Visit: Payer: Self-pay

## 2024-03-05 DIAGNOSIS — I83892 Varicose veins of left lower extremities with other complications: Secondary | ICD-10-CM

## 2024-04-06 DIAGNOSIS — H21232 Degeneration of iris (pigmentary), left eye: Secondary | ICD-10-CM | POA: Diagnosis not present

## 2024-04-06 DIAGNOSIS — H35372 Puckering of macula, left eye: Secondary | ICD-10-CM | POA: Diagnosis not present

## 2024-04-06 DIAGNOSIS — H2513 Age-related nuclear cataract, bilateral: Secondary | ICD-10-CM | POA: Diagnosis not present

## 2024-04-06 DIAGNOSIS — H5213 Myopia, bilateral: Secondary | ICD-10-CM | POA: Diagnosis not present

## 2024-04-12 ENCOUNTER — Ambulatory Visit (HOSPITAL_COMMUNITY)
Admission: RE | Admit: 2024-04-12 | Discharge: 2024-04-12 | Disposition: A | Discharge status: Home / Self Care | Source: Ambulatory Visit | Attending: Vascular Surgery | Admitting: Vascular Surgery

## 2024-04-12 ENCOUNTER — Encounter: Payer: Self-pay | Admitting: Surgery

## 2024-04-12 ENCOUNTER — Ambulatory Visit: Admitting: Surgery

## 2024-04-12 VITALS — BP 127/87 | HR 62 | Temp 98.1°F | Ht 75.0 in | Wt 256.0 lb

## 2024-04-12 DIAGNOSIS — I83892 Varicose veins of left lower extremities with other complications: Secondary | ICD-10-CM | POA: Insufficient documentation

## 2024-04-12 DIAGNOSIS — M7989 Other specified soft tissue disorders: Secondary | ICD-10-CM

## 2024-04-12 NOTE — Progress Notes (Signed)
 [facility]  Patient name: Elijah [name] MRN: [id number] DOB: Jul 06, 1950 Sex: male   REASON FOR VISIT:    Follow-up varicose veins  HISOTRY OF PRESENT ILLNESS:    Elijah [name] is a 73 y.o. male is a former patient of Dr. Eliza.  He underwent laser ablation of the left great and small saphenous vein with greater than 20 stabs for C4 disease.  He has also undergone sclerotherapy in August 2024.  He has had bleeding issues on the left leg prior to his interventions.  He has not had any recurrent bleeding.  He wears compression stockings.  He has developed new veins above his left knee that he wants evaluated, as he is concerned about bleeding.  The patient takes Xarelto  for A-fib which is followed by Dr. Waddell.  He is on a statin for hypercholesterolemia.  He is a non-smoker.   PAST MEDICAL HISTORY:   Past Medical History:  Diagnosis Date   Adenomatous colon polyp    Arthritis    mild thumbs   Atrial fibrillation (HCC) 06/17/2010   Dr Danelle Waddell seen as OP   Atrial fibrillation (HCC) 11/22-23/2013   presentation as nausea, dizziness & chest tightness   Diverticulosis 06/17/2005   Hyperlipidemia    past hx - no issues per pt   Thyroid  disease    hypothyroidism     FAMILY HISTORY:   Family History  Problem Relation Age of Onset   Stroke Mother 54       cns aneurysm bleed X2   Breast cancer Mother    Valvular heart disease Mother        valve replacement   Hypertension Mother    Hypertension Father    Cerebral aneurysm Sister 73   Hypertension Sister    Diabetes Neg Hx    Colon cancer Neg Hx    Rectal cancer Neg Hx    Stomach cancer Neg Hx    Colon polyps Neg Hx    Esophageal cancer Neg Hx     SOCIAL HISTORY:   Social History   Tobacco Use   Smoking status: Never   Smokeless tobacco: Never  Substance Use Topics   Alcohol use: No     ALLERGIES:   No Known  Allergies   CURRENT MEDICATIONS:   Current Outpatient Medications  Medication Sig Dispense Refill   Cholecalciferol (VITAMIN D3) 50 MCG (2000 UT) TABS Take 50 mcg by mouth once.     levothyroxine  (SYNTHROID ) 137 MCG tablet TAKE 1 TABLET(137 MCG) BY MOUTH DAILY BEFORE BREAKFAST 90 tablet 3   rosuvastatin  (CRESTOR ) 10 MG tablet TAKE 1 TABLET(10 MG) BY MOUTH DAILY 90 tablet 3   telmisartan  (MICARDIS ) 20 MG tablet TAKE 1 TABLET(20 MG) BY MOUTH DAILY 90 tablet 1   XARELTO  20 MG TABS tablet TAKE 1 TABLET(20 MG) BY MOUTH DAILY WITH SUPPER 90 tablet 1   No current facility-administered medications for this visit.    REVIEW OF SYSTEMS:   [X]  denotes positive finding, [ ]  denotes negative finding Cardiac  Comments:  Chest pain or chest pressure:    Shortness of breath upon exertion:    Short of breath when lying flat:    Irregular heart rhythm:        Vascular    Pain in calf, thigh, or hip brought on by ambulation:    Pain in feet at night that wakes you up from your sleep:     Blood clot in your  veins:    Leg swelling:  x       Pulmonary    Oxygen at home:    Productive cough:     Wheezing:         Neurologic    Sudden weakness in arms or legs:     Sudden numbness in arms or legs:     Sudden onset of difficulty speaking or slurred speech:    Temporary loss of vision in one eye:     Problems with dizziness:         Gastrointestinal    Blood in stool:     Vomited blood:         Genitourinary    Burning when urinating:     Blood in urine:        Psychiatric    Major depression:         Hematologic    Bleeding problems:    Problems with blood clotting too easily:        Skin    Rashes or ulcers:        Constitutional    Fever or chills:      PHYSICAL EXAM:   Vitals:   04/12/24 1431  BP: 127/87  Pulse: 62  Temp: 98.1 F (36.7 C)  SpO2: 96%  Weight: 256 lb (116.1 kg)  Height: 6' 3 (1.905 m)    GENERAL: The patient is a well-nourished male, in no acute  distress. The vital signs are documented above. CARDIAC: There is a regular rate and rhythm.  VASCULAR: I evaluated his leg with the ultrasound.  His great saphenous appears closed in the proximal thigh.  It is difficult to follow because of the numerous varicosities in the leg however it does appear to be dilated and patent certainly below the knee.  Also the small saphenous appears to be dilated and patent up to the inferior edge of the gastrocnemius muscle and then it becomes difficult for me to follow because of the numerous varicosities which makes me think that the treated area remains closed PULMONARY: Non-labored respirations ABDOMEN: Soft and non-tender with normal pitched bowel sounds.  MUSCULOSKELETAL: There are no major deformities or cyanosis. NEUROLOGIC: No focal weakness or paresthesias are detected. SKIN: See photo below PSYCHIATRIC: The patient has a normal affect.  STUDIES:   I have reviewed the following reflux test: --------------+---------+------+-----------+------------+--------+  LEFT         Reflux NoRefluxReflux TimeDiameter cmsComments                          Yes                                   +--------------+---------+------+-----------+------------+--------+  GSV at [facility]    no                            0.72              +--------------+---------+------+-----------+------------+--------+  GSV prox thighno                            0.47              +--------------+---------+------+-----------+------------+--------+  GSV mid thigh no  0.29              +--------------+---------+------+-----------+------------+--------+  GSV dist thigh          yes    >500 ms      0.57              +--------------+---------+------+-----------+------------+--------+  GSV at knee             yes    >500 ms      0.87              +--------------+---------+------+-----------+------------+--------+  GSV  prox calf           yes    >500 ms      0.81              +--------------+---------+------+-----------+------------+--------+  GSV mid calf            yes    >500 ms      0.67              +--------------+---------+------+-----------+------------+--------+  GSV dist calf           yes    >500 ms      0.68              +--------------+---------+------+-----------+------------+--------+  SSV prox calf           yes    >500 ms      0.51              +--------------+---------+------+-----------+------------+--------+  SSV mid calf            yes    >500 ms      0.58              +--------------+---------+------+-----------+------------+--------+    MEDICAL ISSUES:   CEAP class IV, left leg: The patient has undergone laser ablation of the left great and small saphenous vein.  The great saphenous vein proximally appears to be closed however it appears to have recanalized at the knee where it is dilated.  There are numerous varicosities in this area.  Similarly the small saphenous vein appears patent distally however I have a hard time following it at the calf into the popliteal vein which makes me think it main closed.  His biggest concern is a large varix in the medial thigh that he is concerned about bleeding given his past track record with bleeding.  I had Inocente, and evaluate this.  Flattened out or thrombosed so that the bleeding risk goes away.  If this is successful, I would just continue with compression stockings and leg elevation.  For him to be off of Xarelto  for 3 days prior to his sclerotherapy    Malvina New, IV, MD, FACS [facility] [phone] Pager [phone]

## 2024-04-21 ENCOUNTER — Ambulatory Visit: Attending: Vascular Surgery

## 2024-04-21 DIAGNOSIS — I83892 Varicose veins of left lower extremities with other complications: Secondary | ICD-10-CM | POA: Diagnosis not present

## 2024-04-21 NOTE — Progress Notes (Signed)
 Treated pt's LLE reticular vein on upper thigh with Asclera 1%. Pt received a total of 2 ml/20 mg of Asclera 1%, administered with a 27 gauge butterfly needle. Pt held Xarelto  x 3 days, per MD. He tolerated well; easy access. Area did not flatten out much. We will save other approved vial for next month, if needed. Pt is aware to call us  and keep us  posted on progress. He was placed in 20-30 mm Hg thigh high hose at the end of treatment and given post treatment care instructions.

## 2024-04-28 ENCOUNTER — Ambulatory Visit

## 2024-05-05 ENCOUNTER — Ambulatory Visit (HOSPITAL_COMMUNITY)
Admission: RE | Admit: 2024-05-05 | Discharge: 2024-05-05 | Disposition: A | Discharge status: Home / Self Care | Source: Ambulatory Visit | Attending: Surgery | Admitting: Surgery

## 2024-05-05 ENCOUNTER — Other Ambulatory Visit (HOSPITAL_COMMUNITY): Payer: Self-pay | Admitting: Medical

## 2024-05-05 DIAGNOSIS — M25571 Pain in right ankle and joints of right foot: Secondary | ICD-10-CM | POA: Insufficient documentation

## 2024-05-25 ENCOUNTER — Encounter: Payer: Self-pay | Admitting: Internal Medicine

## 2024-05-26 MED ORDER — TELMISARTAN 40 MG PO TABS: 40.0000 mg | ORAL_TABLET | Freq: Every day | ORAL | 1 refills | Status: DC

## 2024-06-02 ENCOUNTER — Ambulatory Visit: Attending: Vascular Surgery

## 2024-06-02 DIAGNOSIS — I8392 Asymptomatic varicose veins of left lower extremity: Secondary | ICD-10-CM | POA: Diagnosis not present

## 2024-06-02 NOTE — Progress Notes (Signed)
 Treated pt's upper LLE reticular vein and small spider veins with Asclera 1%. Pt received a total of 2 mL/20 mg of Asclera 1%, administered with a 27 gauge butterfly needle. Pt tolerated well. This area has improved a little since his last treatment. He held Xarelto  x 2 days and will resume it today. He was placed in 20-30 mm Hg thigh high compression hose and was given post treatment care instructions on handout and verbally.

## 2024-06-07 ENCOUNTER — Other Ambulatory Visit

## 2024-06-07 DIAGNOSIS — E039 Hypothyroidism, unspecified: Secondary | ICD-10-CM | POA: Diagnosis not present

## 2024-06-07 DIAGNOSIS — I10 Essential (primary) hypertension: Secondary | ICD-10-CM

## 2024-06-07 LAB — BASIC METABOLIC PANEL WITH GFR
BUN: 21 mg/dL (ref 6–23)
CO2: 26 meq/L (ref 19–32)
Calcium: 9.2 mg/dL (ref 8.4–10.5)
Chloride: 106 meq/L (ref 96–112)
Creatinine, Ser: 1.15 mg/dL (ref 0.40–1.50)
GFR: 63.36 mL/min
Glucose, Bld: 102 mg/dL — ABNORMAL HIGH (ref 70–99)
Potassium: 3.9 meq/L (ref 3.5–5.1)
Sodium: 139 meq/L (ref 135–145)

## 2024-06-07 LAB — TSH: TSH: 3.15 u[IU]/mL (ref 0.35–5.50)

## 2024-06-08 ENCOUNTER — Encounter: Payer: Self-pay | Admitting: Internal Medicine

## 2024-06-08 ENCOUNTER — Ambulatory Visit: Payer: Self-pay | Admitting: Internal Medicine

## 2024-06-08 NOTE — Progress Notes (Signed)
 "    HPI: Follow-up atrial fibrillation.  Previously followed by Dr. Waddell but transitioning to me.  Echocardiogram July 2012 showed normal LV function, moderate left atrial enlargement, mild right ventricular enlargement.  Calcium  score November 2023 351 which was 66 percentile.  Monitor August 2025 showed atrial fibrillation with controlled ventricular response.  Lower extremity venous Dopplers November 2025 showed no DVT.  Since last seen patient denies dyspnea, chest pain, palpitations or syncope.  No bleeding.  Current Outpatient Medications  Medication Sig Dispense Refill   Cholecalciferol (VITAMIN D3) 50 MCG (2000 UT) TABS Take 50 mcg by mouth once.     levothyroxine  (SYNTHROID ) 137 MCG tablet TAKE 1 TABLET(137 MCG) BY MOUTH DAILY BEFORE BREAKFAST 90 tablet 3   rosuvastatin  (CRESTOR ) 10 MG tablet TAKE 1 TABLET(10 MG) BY MOUTH DAILY 90 tablet 3   telmisartan  (MICARDIS ) 40 MG tablet Take 1 tablet (40 mg total) by mouth daily. 90 tablet 1   XARELTO  20 MG TABS tablet TAKE 1 TABLET(20 MG) BY MOUTH DAILY WITH SUPPER 90 tablet 1   No current facility-administered medications for this visit.     Past Medical History:  Diagnosis Date   Adenomatous colon polyp    Arthritis    mild thumbs   Atrial fibrillation (HCC) 06/17/2010   Dr Danelle Waddell seen as OP   Atrial fibrillation (HCC) 11/22-23/2013   presentation as nausea, dizziness & chest tightness   Diverticulosis 06/17/2005   Hyperlipidemia    past hx - no issues per pt   Thyroid  disease    hypothyroidism    Past Surgical History:  Procedure Laterality Date   CHEST TUBE INSERTION  1982   Spontaneous Pneumothorax   COLONOSCOPY     COLONOSCOPY W/ POLYPECTOMY  2007   Adenomatous Polyp; [facility], Dr Obie   ENDOVENOUS ABLATION SAPHENOUS VEIN W/ LASER Left 06/19/2022   endovenous laser ablation left greater saphenous vein and stab phlebectomy > 20 incisions left leg by Medford Blade MD   ENDOVENOUS ABLATION SAPHENOUS VEIN W/  LASER Left 01/01/2023   endovenous laser ablation left small saphenous vein and stab phlebectomy 10-20 incisions left leg by Medford Blade MD   Holter monitor-48 hour  03/2011, 12/2010   Dr Cathlyn Waddell   TONSILLECTOMY     TRANSTHORACIC ECHOCARDIOGRAM  12/2010    Social History   Socioeconomic History   Marital status: Married    Spouse name: Darice   Number of children: 2   Years of education: Not on file   Highest education level: Master's degree (e.g., MA, MS, MEng, MEd, MSW, MBA)  Occupational History   Occupation: Retired  Tobacco Use   Smoking status: Never   Smokeless tobacco: Never  Vaping Use   Vaping status: Never Used  Substance and Sexual Activity   Alcohol use: No   Drug use: No   Sexual activity: Never  Other Topics Concern   Not on file  Social History Narrative   Exercise: 5 days of aerobic, two days of weights   Lives with wife.   Social Drivers of Health   Tobacco Use: Low Risk (06/18/2024)   Patient History    Smoking Tobacco Use: Never    Smokeless Tobacco Use: Never    Passive Exposure: Not on file  Financial Resource Strain: Low Risk (04/29/2023)   Overall Financial Resource Strain (CARDIA)    Difficulty of Paying Living Expenses: Not hard at all  Food Insecurity: No Food Insecurity (04/29/2023)   Hunger Vital Sign  Worried About Programme Researcher, Broadcasting/film/video in the Last Year: Never true    Ran Out of Food in the Last Year: Never true  Transportation Needs: No Transportation Needs (04/29/2023)   PRAPARE - Administrator, Civil Service (Medical): No    Lack of Transportation (Non-Medical): No  Physical Activity: Sufficiently Active (04/29/2023)   Exercise Vital Sign    Days of Exercise per Week: 5 days    Minutes of Exercise per Session: 60 min  Stress: No Stress Concern Present (04/29/2023)   [employer] of Occupational Health - Occupational Stress Questionnaire    Feeling of Stress : Not at all  Social Connections: Socially  Integrated (04/29/2023)   Social Connection and Isolation Panel    Frequency of Communication with Friends and Family: More than three times a week    Frequency of Social Gatherings with Friends and Family: More than three times a week    Attends Religious Services: More than 4 times per year    Active Member of Clubs or Organizations: Yes    Attends Banker Meetings: More than 4 times per year    Marital Status: Married  Catering Manager Violence: Not At Risk (04/29/2023)   Humiliation, Afraid, Rape, and Kick questionnaire    Fear of Current or Ex-Partner: No    Emotionally Abused: No    Physically Abused: No    Sexually Abused: No  Depression (PHQ2-9): Low Risk (11/14/2023)   Depression (PHQ2-9)    PHQ-2 Score: 0  Alcohol Screen: Low Risk (04/29/2023)   Alcohol Screen    Last Alcohol Screening Score (AUDIT): 0  Housing: Low Risk (04/29/2023)   Housing    Last Housing Risk Score: 0  Utilities: Not At Risk (04/29/2023)   AHC Utilities    Threatened with loss of utilities: No  Health Literacy: Adequate Health Literacy (04/29/2023)   B1300 Health Literacy    Frequency of need for help with medical instructions: Never    Family History  Problem Relation Age of Onset   Stroke Mother 50       cns aneurysm bleed X2   Breast cancer Mother    Valvular heart disease Mother        valve replacement   Hypertension Mother    Hypertension Father    Cerebral aneurysm Sister 75   Hypertension Sister    Diabetes Neg Hx    Colon cancer Neg Hx    Rectal cancer Neg Hx    Stomach cancer Neg Hx    Colon polyps Neg Hx    Esophageal cancer Neg Hx     ROS: no fevers or chills, productive cough, hemoptysis, dysphasia, odynophagia, melena, hematochezia, dysuria, hematuria, rash, seizure activity, orthopnea, PND, pedal edema, claudication. Remaining systems are negative.  Physical Exam: Well-developed well-nourished in no acute distress.  Skin is warm and dry.  HEENT is  normal.  Neck is supple.  Chest is clear to auscultation with normal expansion.  Cardiovascular exam is irregular Abdominal exam nontender or distended. No masses palpated. Extremities show no edema. neuro grossly intact  EKG Interpretation Date/Time:  Friday June 18 2024 12:03:55 EST Ventricular Rate:  92 PR Interval:    QRS Duration:  82 QT Interval:  360 QTC Calculation: 445 R Axis:   -31  Text Interpretation: Atrial fibrillation Left axis deviation Confirmed by Pietro Rogue ([id number]) on 06/18/2024 12:14:41 PM    A/P  1 permanent atrial fibrillation-heart rate is controlled on no medications.  Continue Xarelto   at present dose.  Repeat echocardiogram.  2 coronary calcification-continue Crestor .  He denies chest pain.  3 hyperlipidemia-continue Crestor .  Check lipids and liver.  4 hypertension-blood pressure has been elevated at home after recent increase of myocardial to 40 mg daily.  Will increase to 60 mg daily and follow.  Check potassium and renal function in 1 week.  5 history of venous insufficiency  Redell Shallow, MD    "

## 2024-06-09 ENCOUNTER — Other Ambulatory Visit: Payer: Self-pay | Admitting: Internal Medicine

## 2024-06-18 ENCOUNTER — Encounter: Payer: Self-pay | Admitting: Cardiology

## 2024-06-18 ENCOUNTER — Ambulatory Visit: Attending: Cardiology | Admitting: Cardiology

## 2024-06-18 VITALS — BP 130/84 | HR 92 | Ht 75.0 in | Wt 255.1 lb

## 2024-06-18 DIAGNOSIS — I482 Chronic atrial fibrillation, unspecified: Secondary | ICD-10-CM

## 2024-06-18 DIAGNOSIS — I2510 Atherosclerotic heart disease of native coronary artery without angina pectoris: Secondary | ICD-10-CM

## 2024-06-18 DIAGNOSIS — I10 Essential (primary) hypertension: Secondary | ICD-10-CM

## 2024-06-18 MED ORDER — TELMISARTAN 40 MG PO TABS: 60.0000 mg | ORAL_TABLET | Freq: Every day | ORAL | 3 refills | Status: AC

## 2024-06-18 NOTE — Patient Instructions (Signed)
 Medication Instructions:   INCREASE TELMISARTAN  TO 60 MG ONCE DAILY= 1 AND 1/2 OF THE 40 MG TABLET ONCE DAILY  *If you need a refill on your cardiac medications before your next appointment, please call your pharmacy*  Lab Work:  Your physician recommends that you return for lab work in: ONE WEEK-DO NOT NEED TO FAST  If you have labs (blood work) drawn today and your tests are completely normal, you will receive your results only by: MyChart Message (if you have MyChart) OR A paper copy in the mail If you have any lab test that is abnormal or we need to change your treatment, we will call you to review the results.  Testing/Procedures:  Your physician has requested that you have an echocardiogram. Echocardiography is a painless test that uses sound waves to create images of your heart. It provides your doctor with information about the size and shape of your heart and how well your hearts chambers and valves are working. This procedure takes approximately one hour. There are no restrictions for this procedure. Please do NOT wear cologne, perfume, aftershave, or lotions (deodorant is allowed). Please arrive 15 minutes prior to your appointment time.  Please note: We ask at that you not bring children with you during ultrasound (echo/ vascular) testing. Due to room size and safety concerns, children are not allowed in the ultrasound rooms during exams. Our front office staff cannot provide observation of children in our lobby area while testing is being conducted. An adult accompanying a patient to their appointment will only be allowed in the ultrasound room at the discretion of the ultrasound technician under special circumstances. We apologize for any inconvenience. [address]  Follow-Up: At [facility], you and your health needs are our priority.  As part of our continuing mission to provide you with exceptional heart care, our providers are all part of one team.  This team  includes your primary Cardiologist (physician) and Advanced Practice Providers or APPs (Physician Assistants and Nurse Practitioners) who all work together to provide you with the care you need, when you need it.  Your next appointment:   12 month(s)  Provider:   REDELL SHALLOW MD

## 2024-06-28 ENCOUNTER — Encounter: Payer: Self-pay | Admitting: Cardiology

## 2024-06-29 ENCOUNTER — Ambulatory Visit: Payer: Self-pay | Admitting: Cardiology

## 2024-06-29 LAB — BASIC METABOLIC PANEL WITH GFR
BUN/Creatinine Ratio: 14 (ref 10–24)
BUN: 16 mg/dL (ref 8–27)
CO2: 21 mmol/L (ref 20–29)
Calcium: 9.4 mg/dL (ref 8.6–10.2)
Chloride: 106 mmol/L (ref 96–106)
Creatinine, Ser: 1.12 mg/dL (ref 0.76–1.27)
Glucose: 85 mg/dL (ref 70–99)
Potassium: 3.9 mmol/L (ref 3.5–5.2)
Sodium: 141 mmol/L (ref 134–144)
eGFR: 69 mL/min/1.73

## 2024-06-30 ENCOUNTER — Telehealth: Payer: Self-pay | Admitting: Pharmacy Technician

## 2024-06-30 ENCOUNTER — Other Ambulatory Visit (HOSPITAL_COMMUNITY): Payer: Self-pay

## 2024-06-30 NOTE — Telephone Encounter (Signed)
 Pharmacy Patient Advocate Encounter   Received notification from Patient Advice Request messages that prior authorization for TELMISARTAN  40MG  is required/requested.   Insurance verification completed.   The patient is insured through [employer].   Per test claim: PA required; PA submitted to above mentioned insurance via Latent Key/confirmation #/EOC [id number] Status is pending

## 2024-07-01 NOTE — Telephone Encounter (Signed)
 Pharmacy Patient Advocate Encounter  Received notification from [employer] that Prior Authorization for telmisartan  has been APPROVED from 06/17/24 to 06/16/25   PA #/Case ID/Reference #: [id number]

## 2024-07-16 ENCOUNTER — Other Ambulatory Visit: Payer: Self-pay | Admitting: Internal Medicine

## 2024-07-26 ENCOUNTER — Ambulatory Visit (HOSPITAL_COMMUNITY)

## 2024-07-27 ENCOUNTER — Ambulatory Visit

## 2024-11-15 ENCOUNTER — Encounter: Admitting: Internal Medicine
